# Patient Record
Sex: Male | Born: 1950
Health system: Southern US, Community
[De-identification: ages and names within clinical notes are randomized; demographics above are authoritative.]

## PROBLEM LIST (undated history)

## (undated) DIAGNOSIS — D696 Thrombocytopenia, unspecified: Secondary | ICD-10-CM

## (undated) DIAGNOSIS — E119 Type 2 diabetes mellitus without complications: Secondary | ICD-10-CM

## (undated) DIAGNOSIS — I10 Essential (primary) hypertension: Secondary | ICD-10-CM

## (undated) DIAGNOSIS — H269 Unspecified cataract: Secondary | ICD-10-CM

## (undated) DIAGNOSIS — C19 Malignant neoplasm of rectosigmoid junction: Secondary | ICD-10-CM

## (undated) DIAGNOSIS — E785 Hyperlipidemia, unspecified: Secondary | ICD-10-CM

## (undated) HISTORY — DX: Type 2 diabetes mellitus without complications: E11.9

## (undated) HISTORY — DX: Unspecified cataract: H26.9

## (undated) HISTORY — DX: Thrombocytopenia, unspecified: D69.6

## (undated) HISTORY — DX: Essential (primary) hypertension: I10

## (undated) HISTORY — DX: Malignant neoplasm of rectosigmoid junction: C19

## (undated) HISTORY — DX: Hyperlipidemia, unspecified: E78.5

---

## 2001-12-10 HISTORY — PX: CARDIAC CATHETERIZATION: SHX172

## 2004-11-13 ENCOUNTER — Ambulatory Visit: Payer: Self-pay | Admitting: Internal Medicine

## 2004-12-10 ENCOUNTER — Ambulatory Visit: Payer: Self-pay | Admitting: Internal Medicine

## 2005-03-16 ENCOUNTER — Ambulatory Visit: Payer: Self-pay | Admitting: Internal Medicine

## 2005-03-19 ENCOUNTER — Ambulatory Visit: Payer: Self-pay

## 2005-04-02 ENCOUNTER — Ambulatory Visit: Payer: Self-pay | Admitting: Cardiovascular Disease

## 2005-04-09 ENCOUNTER — Ambulatory Visit: Payer: Self-pay | Admitting: Internal Medicine

## 2007-01-28 ENCOUNTER — Encounter: Payer: Self-pay | Admitting: General Practice

## 2010-11-30 ENCOUNTER — Ambulatory Visit: Payer: Self-pay | Admitting: Family Medicine

## 2010-12-10 ENCOUNTER — Ambulatory Visit: Payer: Self-pay | Admitting: Family Medicine

## 2011-01-10 ENCOUNTER — Ambulatory Visit: Payer: Self-pay | Admitting: Family Medicine

## 2011-02-08 ENCOUNTER — Ambulatory Visit: Payer: Self-pay | Admitting: Family Medicine

## 2014-10-12 ENCOUNTER — Ambulatory Visit: Payer: Self-pay

## 2014-10-12 ENCOUNTER — Ambulatory Visit: Payer: Self-pay | Admitting: Hematology and Oncology

## 2014-10-12 LAB — CBC CANCER CENTER
Bands: 2 %
Basophil: 1 %
Comment - H1-Com1: NORMAL
Comment - H1-Com2: NORMAL
Eosinophil: 5 %
HCT: 42.6 % (ref 40.0–52.0)
HGB: 14.9 g/dL (ref 13.0–18.0)
LYMPHS PCT: 35 %
MCH: 32.9 pg (ref 26.0–34.0)
MCHC: 35 g/dL (ref 32.0–36.0)
MCV: 94 fL (ref 80–100)
MONOS PCT: 2 %
PLATELETS: 50 x10 3/mm — AB (ref 150–440)
RBC: 4.52 10*6/uL (ref 4.40–5.90)
RDW: 12.4 % (ref 11.5–14.5)
SEGMENTED NEUTROPHILS: 55 %
WBC: 6 x10 3/mm (ref 3.8–10.6)

## 2014-10-12 LAB — IRON AND TIBC
IRON BIND. CAP.(TOTAL): 302 ug/dL (ref 250–450)
IRON SATURATION: 34 %
Iron: 103 ug/dL (ref 65–175)
Unbound Iron-Bind.Cap.: 199 ug/dL

## 2014-10-12 LAB — RETICULOCYTES
Absolute Retic Count: 0.0667 10*6/uL (ref 0.019–0.186)
Reticulocyte: 1.48 % (ref 0.4–3.1)

## 2014-10-12 LAB — PROTIME-INR
INR: 1
PROTHROMBIN TIME: 13 s (ref 11.5–14.7)

## 2014-10-12 LAB — FOLATE: Folic Acid: 14 ng/mL (ref 3.1–100.0)

## 2014-10-12 LAB — LACTATE DEHYDROGENASE: LDH: 175 U/L (ref 85–241)

## 2014-10-12 LAB — APTT: Activated PTT: 26.3 secs (ref 23.6–35.9)

## 2014-10-12 LAB — FIBRIN DEGRADATION PROD.(ARMC ONLY): Fibrin Degradation Prod.: <10 ug/ml (ref 2.1–7.7)

## 2014-10-12 LAB — FERRITIN: FERRITIN (ARMC): 57 ng/mL (ref 8–388)

## 2014-10-12 LAB — FIBRINOGEN: Fibrinogen: 219 mg/dL (ref 210–470)

## 2014-10-13 LAB — PROT IMMUNOELECTROPHORES(ARMC)

## 2014-10-15 LAB — URINE IEP, RANDOM

## 2014-11-09 ENCOUNTER — Ambulatory Visit: Payer: Self-pay

## 2014-11-15 LAB — CBC CANCER CENTER
Basophil #: 0 x10 3/mm (ref 0.0–0.1)
Basophil %: 0.9 %
Eosinophil #: 0.2 x10 3/mm (ref 0.0–0.7)
Eosinophil %: 3.9 %
HCT: 43.2 % (ref 40.0–52.0)
HGB: 14.7 g/dL (ref 13.0–18.0)
LYMPHS ABS: 1.9 x10 3/mm (ref 1.0–3.6)
LYMPHS PCT: 42.6 %
MCH: 32.1 pg (ref 26.0–34.0)
MCHC: 34.1 g/dL (ref 32.0–36.0)
MCV: 94 fL (ref 80–100)
Monocyte #: 0.3 x10 3/mm (ref 0.2–1.0)
Monocyte %: 6 %
NEUTROS ABS: 2.1 x10 3/mm (ref 1.4–6.5)
NEUTROS PCT: 46.6 %
Platelet: 53 x10 3/mm — ABNORMAL LOW (ref 150–440)
RBC: 4.59 10*6/uL (ref 4.40–5.90)
RDW: 12.7 % (ref 11.5–14.5)
WBC: 4.5 x10 3/mm (ref 3.8–10.6)

## 2014-12-10 ENCOUNTER — Ambulatory Visit: Payer: Self-pay

## 2015-01-11 ENCOUNTER — Ambulatory Visit: Payer: Self-pay | Admitting: Hematology and Oncology

## 2015-01-11 LAB — CBC CANCER CENTER
BASOS ABS: 0 x10 3/mm (ref 0.0–0.1)
BASOS PCT: 0.9 %
EOS ABS: 0.3 x10 3/mm (ref 0.0–0.7)
Eosinophil %: 6.4 %
HCT: 44.5 % (ref 40.0–52.0)
HGB: 15.2 g/dL (ref 13.0–18.0)
Lymphocyte #: 2.2 x10 3/mm (ref 1.0–3.6)
Lymphocyte %: 45.7 %
MCH: 31.9 pg (ref 26.0–34.0)
MCHC: 34.1 g/dL (ref 32.0–36.0)
MCV: 93 fL (ref 80–100)
Monocyte #: 0.3 x10 3/mm (ref 0.2–1.0)
Monocyte %: 6.9 %
Neutrophil #: 1.9 x10 3/mm (ref 1.4–6.5)
Neutrophil %: 40.1 %
PLATELETS: 60 x10 3/mm — AB (ref 150–440)
RBC: 4.77 10*6/uL (ref 4.40–5.90)
RDW: 12.4 % (ref 11.5–14.5)
WBC: 4.8 x10 3/mm (ref 3.8–10.6)

## 2015-01-11 LAB — CANCER CTR PLATELET CT: Platelet: 58 x10 3/mm — ABNORMAL LOW (ref 150–440)

## 2015-02-08 ENCOUNTER — Ambulatory Visit
Admit: 2015-02-08 | Disposition: A | Discharge status: Home / Self Care | Payer: Self-pay | Attending: Hematology and Oncology | Admitting: Hematology and Oncology

## 2015-03-11 ENCOUNTER — Ambulatory Visit
Admit: 2015-03-11 | Disposition: A | Discharge status: Home / Self Care | Payer: Self-pay | Attending: Hematology and Oncology | Admitting: Hematology and Oncology

## 2015-03-25 LAB — CBC CANCER CENTER
Basophil #: 0 x10 3/mm (ref 0.0–0.1)
Basophil %: 0.9 %
Eosinophil #: 0.1 x10 3/mm (ref 0.0–0.7)
Eosinophil %: 2.8 %
HCT: 45.3 % (ref 40.0–52.0)
HGB: 15.3 g/dL (ref 13.0–18.0)
Lymphocyte #: 2.3 x10 3/mm (ref 1.0–3.6)
Lymphocyte %: 48.1 %
MCH: 31.5 pg (ref 26.0–34.0)
MCHC: 33.7 g/dL (ref 32.0–36.0)
MCV: 93 fL (ref 80–100)
Monocyte #: 0.3 x10 3/mm (ref 0.2–1.0)
Monocyte %: 5.5 %
Neutrophil #: 2 x10 3/mm (ref 1.4–6.5)
Neutrophil %: 42.7 %
Platelet: 65 x10 3/mm — ABNORMAL LOW (ref 150–440)
RBC: 4.85 10*6/uL (ref 4.40–5.90)
RDW: 12.4 % (ref 11.5–14.5)
WBC: 4.8 x10 3/mm (ref 3.8–10.6)

## 2015-04-20 ENCOUNTER — Other Ambulatory Visit: Payer: Self-pay

## 2015-04-20 NOTE — Patient Outreach (Signed)
[facility]) Care Management  04/20/2015  [place] Dec 25, 1950 [id number]   [place] did not show for his scheduled visit. Interpreter had been arranged and showed for the visit.  I will reschedule this visit.   [name], RN, BA, [place], [place] Direct Dial:  [phone]  Fax:  [phone] [email or web address]: [email or web address] [address] [place], [place]  [address]

## 2015-06-15 ENCOUNTER — Other Ambulatory Visit: Payer: Self-pay

## 2015-06-15 NOTE — Patient Instructions (Addendum)
Ictus isqumico (Ischemic Stroke) Un ictus (accidente cerebrovascular) es la muerte sbita del tejido cerebral. Esto es una emergencia mdica. Un ictus puede causar una prdida permanente de la actividad cerebral. Esto puede causar problemas con diferentes partes del cuerpo. Un ataque isqumico transitorio (AIT) es diferente porque no causa un dao permanente. Un AIT es un problema, de corta duracin, de flujo sanguneo deficiente, que afecta a una parte del cerebro. Un AIT tambin es un problema serio porque aumenta considerablemente las probabilidades de sufrir un ictus. Cuando los sntomas se presentan por primera vez, es imposible saber si se trata de un ictus o un AIT. CAUSAS  Un ictus se produce debido a una disminucin del suministro de oxgeno a una zona del cerebro. Generalmente, es el resultado de un pequeo cogulo sanguneo o acumulacin de colesterol o grasa (placa) que bloquea el flujo sanguneo en el cerebro. Un ictus tambin puede producirse por una obstruccin o dao en las arterias cartidas.  FACTORES DE RIESGO Presin arterial elevada (hipertensin). Colesterol elevado. Diabetes mellitus. Enfermedades cardacas. La acumulacin de placa en los vasos sanguneos (enfermedad arterial perifrica o ateroesclerosis). La acumulacin de placa en los vasos sanguneos que proveen sangre y oxgeno al cerebro (estenosis de la arteria cartida). Ritmo cardaco anormal (fibrilacin auricular). La obesidad. Fumar. Consumir anticonceptivos orales, especialmente en combinacin con el hbito de fumar. Falta de actividad fsica. Dieta rica en grasas, sal (sodio) y muchas caloras. Consumo de alcohol. Consumo de drogas ilegales (especialmente cocana y metanfetamina). Ser de Mining engineer. Tener ms de 55aos. Antecedentes familiares de ictus. Historia previa de cogulos sanguneos, ictus, ataque isqumico transitorio o infarto. Anemia drepanoctica. SNTOMAS  Estos sntomas por lo  general aparecen en forma repentina, o podran aparecer al despertar: Debilidad o adormecimiento sbito de la cara, el brazo o la pierna, especialmente en un lado del cuerpo. Dificultad repentina para caminar o para mover los brazos o las piernas. Confusin sbita. Cambios de personalidad sbitos. Dificultad para hablar (afasia) o comprender. Dificultad para tragar. Dificultad sbita en la visin de uno o ambos ojos. Visin doble. Mareos. Prdida del equilibrio o de la coordinacin. Dolor de [place] repentino sin causa aparente. Dificultad para leer o escribir. DIAGNSTICO  El mdico podr determinar la presencia o ausencia de un ictus en funcin de los sntomas, antecedentes y del examen fsico. [place], se realiza una tomografa computarizada (TC) del cerebro para confirmar el ictus, determinar las causas y la gravedad. Se pueden realizar otros estudios para hallar la causa del ictus. Estos estudios pueden incluir: Aeronautical engineer. Monitorizacin electrocardiogrfica continua: Ecocardiograma. Ecografa de la cartida. Imgenes por Health visitor (IRM). Estudio de la circulacin cerebral. Anlisis de [place]. [place] sufrir un ictus puede disminuir al tratar, de manera apropiada, la hipertensin arterial, el colesterol alto, la diabetes, la enfermedad cardaca y la obesidad, y al dejar de fumar, limitar el consumo de alcohol y Buyer, retail fsicamente [place]. [place] es fundamental. Es importante que busque tratamiento ante Software engineer signo de estos sntomas porque podr recibir un medicamento para Surveyor, mining (tromboltico), que no puede administrarse si ha pasado demasiado tiempo desde el comienzo de los sntomas. Aunque no sepa cundo comenzaron los sntomas, obtenga tratamiento lo antes posible, dado que hay otras opciones de tratamiento disponibles, que incluyen oxgeno, lquidos intravenosos (IV) y anticoagulantes. El tratamiento del  ictus depende de la duracin, la gravedad y la causa de los sntomas. Pueden usarse medicamentos e incluirse cambios en la dieta para tratar la diabetes, la hipertensin arterial  y [place]. Los fisioterapeutas y terapeutas del habla y ocupacionales lo evaluarn y trabajarn con usted para mejorar las funciones deterioradas por el ictus. Se tomarn medidas para prevenir complicaciones a corto y Barrister's clerk, que incluyen infecciones por Naval architect extrao en los pulmones (neumona por aspiracin), cogulos sanguneos en las piernas, escaras y cadas. Pocas veces se requiere Ardelia Mems ciruga para extraer grandes cogulos de sangre o abrir arterias bloqueadas. [place] los medicamentos solamente como se lo haya indicado el mdico. Siga cuidadosamente las indicaciones. Podrn administrarle medicamentos para controlar los factores de riesgo del ictus. Asegrese de comprender todas las indicaciones en relacin a la administracin de los medicamentos. Tal vez le indiquen que tome un medicamento para Building surveyor la [place], como la aspirina o el anticoagulante warfarina. Es necesario que reciba la warfarina exactamente segn las indicaciones. Las dosis altas y bajas de warfarina son peligrosas. El exceso de warfarina aumenta el riesgo de [place]. Dosis demasiado bajas de warfarina aumentan el riesgo de formacin de cogulos. Durante el tratamiento con warfarina, es necesario que se realicen peridicamente anlisis de sangre que miden el tiempo de Proofreader. Los anlisis de sangre incluirn el TP y el INR. Los resultados del [place] y del INR permiten al mdico ajustar la dosis de warfarina. La dosis puede cambiarse por varias razones. Es de suma importancia que tome la warfarina exactamente como se le prescribi, y que tenga los niveles de [place] y de INR exactamente como se le indic. Muchos de los alimentos, especialmente los que contienen vitamina K pueden interferir con  la warfarina y Print production planner los resultados del [place] y del INR. Los alimentos con alto contenido de vitamina K incluyen espinaca, col rizada, brcoli, repollo, acelga y nabos verdes, repollitos de [place], guisantes, coliflor, algas, perejil, hgado de cerdo y de [place], t verde y Scientist, research (life sciences) de soja. Debe consumir siempre una buena cantidad de alimentos ricos en vitamina K. Evite los Continental Airlines en su dieta, o informe a su mdico antes de cambiar su dieta. Solicite una cita con un nutricionista para hacerle las preguntas que le surjan. Muchos medicamentos interfieren con la warfarina y [employer] del [place] y del INR. Debe informarle a su mdico acerca de todos los medicamentos que tome. Esto incluye todas las vitaminas y suplementos. Tenga especial cuidado con la aspirina y los medicamentos antiinflamatorios. No tome ni suspenda ningn medicamento tanto recetado como de [employer], excepto si se lo indica el mdico o el farmacutico. La warfarina puede tener [place], tales como hematomas o sangrado en exceso. Si se lastima, deber ejercer presin sobre las heridas por ms tiempo que lo habitual. Su mdico o su Development worker, international aid comentarn otros posibles efectos secundarios. Evite practicar deportes que puedan causar lesiones o sangrado. Sea cuidadoso al [employer], Risk manager hilo dental, o manipular objetos cortantes. El alcohol puede modificar la capacidad del organismo de metabolizar la warfarina. Es Museum/gallery conservator las bebidas alcohlicas o consumir solo cantidades muy pequeas cuando se est en tratamiento con warfarina. Hgale saber a su mdico si modifica su consumo de alcohol. Informe a su dentista o a otros profesionales antes de que le efecten cualquier procedimiento. Si los estudios han determinado la presencia de su reflejo de deglucin, debera ingerir alimentos saludables. Una dieta que incluya 5o ms porciones de frutas y vegetales por da puede reducir el riesgo de ictus. Los  alimentos deben tener una consistencia especial (blandos o hechos pur) o los bocados deben ser  pequeos para no aspirarse o ahogarse. Se pueden indicar determinados cambios alimenticios para controlar la hipertensin arterial, los niveles elevados de colesterol, la diabetes o la obesidad. Para controlar la hipertensin arterial, se recomiendan alimentos con bajo contenido de sodio, grasas saturadas, grasas (trans) y colesterol. Los niveles de colesterol se pueden Chief Technology Officer con alimentos que tienen alto contenido de [place] y bajo contenido de grasas saturadas, grasas trans y Research officer, trade union. Para controlar la diabetes, se recomienda controlar el consumo de carbohidratos y Location manager. Para controlar la obesidad, se recomienda reducir el consumo de caloras y elegir alimentos con bajo contenido de sodio, grasas saturadas, grasas trans y colesterol. Mantenga un peso saludable. Mantngase fsicamente activo. Se recomienda que realice al menos [id number] de [place] todos o casi [employer]. No consuma ningn producto que contenga tabaco, como cigarrillos, tabaco de Higher education careers adviser o Psychologist, sport and exercise. Limite el consumo de alcohol aunque no tome warfarina. Se considera consumo moderado: No ms de 2 medidas por da para los hombres. No ms de 1 medida por da para las mujeres que no estn [place]. La seguridad en el hogar. Un ambiente seguro en el hogar es importante para reducir el riesgo de cadas. El mdico podr disponer que los especialistas evalen su hogar. Tambin es Data processing manager barras para sostn en la habitacin y el bao. El Buyer, retail un equipo para Occupational psychologist, como un inodoro elevado y un asiento para la ducha. Terapia fsica, ocupacional y del habla. Tal vez sea necesario realizar un tratamiento continuo para optimizar la recuperacin despus de un ictus. Si le aconsejaron el uso de un bastn o andador, selo en todo momento. Concurra a todas las sesiones teraputicas. Siga  todas las indicaciones en cuanto a los controles con el mdico. Esto es muy importante. Aqu se incluyen interconsultas, fisioterapia, rehabilitacin y anlisis de laboratorio. El control apropiado puede evitar que se produzca otro ictus. SOLICITE ATENCIN MDICA SI: Hay cambios en su personalidad. Tiene dificultad para tragar. Tiene visin doble Tiene mareos. Tiene fiebre. Tiene lceras en la piel. SOLICITE ATENCIN MDICA DE INMEDIATO SI:  Cualquiera de estos sntomas puede representar un problema grave y es Engineer, maintenance (IT). No espere a que los sntomas desaparezcan. Solicite atencin mdica de inmediato. Comunquese con el servicio de emergencias de su localidad (911 en los [place]). No conduzca por sus propios medios Principal Financial. Siente debilidad o adormecimiento sbito de la cara, el brazo o la pierna, especialmente en un lado del cuerpo. Tiene una dificultad repentina para caminar o para mover los brazos o las piernas. Se siente repentinamente confundido. Tiene dificultad para hablar (afasia) o comprender. Presenta, sbitamente, dificultad para ver de uno o ambos ojos. Pierde el equilibrio o la coordinacin. Siente sbitamente un dolor de cabeza intenso que no tiene causa aparente. Siente un nuevo dolor en el pecho o latidos cardacos irregulares. Sufre la prdida parcial o total de la conciencia. Document Released: 09/05/2005 Document Revised: 04/12/2014 [facility] Patient Information 2015 [place]. This information is not intended to replace advice given to you by your health care provider. Make sure you discuss any questions you have with your health care provider. Infarto de miocardio (Myocardial Infarction) Un infarto de miocardio (IM) es una lesin irreversible en el corazn. Tambin se denomina ataque al corazn. El infarto de miocardio por lo general sucede cuando una arteria del corazn (coronaria) se obstruye o bloquea. Esto produce una disminucin del flujo  sanguneo al corazn. Cuando una o ms arterias del corazn (coronarias) se obstruyen, esa  zona del corazn comienza a morir. Esto produce el dolor que se siente durante el infarto de miocardio.  Si cree que podra estar sufriendo un infarto de miocardio, comunquese inmediatamente con el servicio de emergencias de su localidad (911 en [place] Se recomienda que mastique y trague 3aspirinas infantiles sin [place] entrica sino tiene [place] a la aspirina. No conduzca por sus propios medios al hospital ni espere para ver si los sntomas desaparecen. Cuanto antes reciba tratamiento, mayor ser la cantidad de msculo cardaco que se salve. El tiempo salva al [employer]. Esto puede salvar su vida. CAUSAS  Un infarto de miocardio puede ocurrir por:  Una acumulacin gradual de sustancia grasa llamada placa. Cuando la placa se acumula en las arterias, causa una enfermedad llamada aterosclerosis. Esta acumulacin puede bloquear o reducir el flujo sanguneo hacia las arterias del corazn.  Una rotura repentina de la placa interna de la arteria del corazn que cause un cogulo sanguneo (trombo). Un cogulo sanguneo puede obstruir una arteria del corazn y no permitir el flujo sanguneo hacia el corazn.  Un gran estrechamiento (espasmo) de la arteria del corazn. Esta es una causa menos frecuente de infarto. Cuando una arteria cardaca sufre un espasmo, se detiene el flujo sanguneo por la arteria. Los espasmos pueden ocurrir en arterias del corazn que no tengan aterosclerosis. [place] que tienen riesgo de sufrir infarto de miocardio generalmente tienen uno o ms factores de riesgo como:  Hipertensin arterial.  Colesterol elevado.  Fumar.  El sexo. Los hombres tienen ms riesgo de sufrir un ataque al Intel Corporation.  Sobrepeso/obesidad.  La edad.  Antecedentes familiares.  La falta de actividad fsica.  Diabetes.  Estrs.  Abuso en el consumo de bebidas  alcohlicas.  Consumo de drogas (cocana y metanfetaminas). SNTOMAS  Los sntomas pueden variar, por ejemplo:  Los sntomas de infarto de miocardio incluyen lo siguiente, tanto en hombres como en mujeres:  Dolor en el pecho. Este dolor se siente como una aplastamiento, torsin u opresin Architectural technologist. Puede "desplazarse" es decir que puede estar originado en una parte del cuerpo y sentirse en otra. Puede sentirse en el brazo izquierdo, en el cuello o la [place]. Tambin puede sentirse en el brazo derecho.  Falta de aire (disnea).  Acidez o indigestin con o sin vmitos, falta de aire o sudoraciones (diaforesis).  Sudor fro repentino.  Mareos repentinos.  Dolor en la espalda.  Las mujeres pueden tener un nico sntoma como:  Sensacin inexplicable de nerviosismo o ansiedad.  Molestias entre los omplatos (escpula) o en la parte superior de la espalda.  Hormigueos en las manos y los brazos.  En los ancianos (sin importar el sexo), los sntomas pueden ser tan sutiles como:  Sudoraciones (diaforesis).  Falta de aire (disnea).  Cansancio general (fatiga) o no sentirse bien Web designer). DIAGNSTICO  El diagnstico de infarto de miocardio incluye diferentes estudios como:  Evaluacin de los signos vitales como el ritmo cardaco, presin arterial, frecuencia respiratoria y [place] de oxgeno.  Un ECG para observar la actividad elctrica del corazn.  Ciertos anlisis de sangre llamados marcadores cardacos se realizan segn Location manager para medir las protenas o las enzimas liberadas por el msculo cardaco daado.  Radiografa de trax.  Un ecocardiograma para evaluar el movimiento del corazn y el flujo sanguneo.  Angiografa coronaria (cateterismo cardaco). Este es un procedimiento diagnstico para observar las arterias del corazn. TRATAMIENTO  Intervencin [place]. En el caso de infarto de miocardio, el estndar Manpower Inc  Unidos es realizar una  intervencin aguda en menos de 90 minutos luego de ingresar al hospital. Ardelia Mems intervencin aguda es un procedimiento especial para abrir las arterias del corazn. Se hace en una sala de tratamiento llamada "sala de cateterismo". Algunos hospitales no cuentan con esta sala. Si est sufriendo un infarto de miocardio y el hospital no cuenta con una sala de cateterismo, la medida estndar es trasladarlo a un hospital que lo tenga. En esta sala de cateterismo, la intervencin aguda comprende:  Angioplastia. La angioplastia incluye introducir un tubo delgado y flexible (catter) en una arteria de la ingle o la [employer]. Se gua el catter [employer] International arterias del corazn. Se infla el baln que se encuentra en el extremo del catter para abrir la arteria obstruida o estrecha. Durante la angioplastia, puede utilizarse un pequeo tubo de [place] (stent) para Theatre manager la arteria del Film/video editor. Segn sea su afeccin y sus antecedentes mdicos, podrn colocarle uno de [employer]seco tipos de stent:  Stents liberadores de frmacos (SLF). Este tipo de stent est cubierto con un medicamento que evita la formacin de cicatrices [place]. En este caso, ser necesario que reciba medicamentos anticoagulantes durante hasta un ao.  Stents metlicos. Este tipo de stent no tiene un recubrimiento especial para evitar que los tejidos se desarrollen sobre l. Se utiliza en caso de que el paciente no pueda recibir medicamentos anticoagulantes por un perodo prolongado o que necesite una ciruga en un futuro cercano. Una vez colocado el stent metlico, deber recibir medicamentos anticoagulantes durante alrededor de Financial risk analyst.  Si est tomando un medicamento anticoagulante (tratamiento antiplaquetario) despus de la colocacin del stent, no deje de tomarlo si no recibe indicaciones del mdico. Asegrese de comprender durante cunto tiempo debe tomar el medicamento. Intervencin [place].  Si no se obtiene xito con el  tratamiento agudo, ser Intel se someta a [place].  Ciruga a corazn abierto (injerto de revascularizacin arterial coronaria, CABG). En el injerto de revascularizacin arterial coronaria, se toma una vena (venasafena) de la pierna. Luego la vena se une a la arteria cardaca obstruida con lo cual se desva el flujo sanguneo evitando la zona obstruida. Esto permite que se reanude el flujo sanguneo hacia el msculo cardaco. Intervenciones adicionales  Pueden administrarle un "destructor de cogulos" (tromboltico). Este medicamento ayuda a romper el cogulo que se encuentra en la arteria coronaria. Este medicamento puede administrarse si la persona no puede llegar a una sala de cateterismo de forma inmediata.  Baln de contrapulsacin intra-artico (BCPA). Si ha sufrido un infarto de miocardio grave y est inestable para que lo lleven a la sala de cateterismo o le realicen una ciruga, podrn colocarle un baln de contrapulsacin intra-artico. Este es un dispositivo mecnico temporal que se utiliza para aumentar el flujo sanguneo hacia el corazn y reducir la carga de trabajo del corazn hasta que se encuentre lo suficientemente estable como para que lo traten en la sala de cateterismo o le realicen Clementeen Hoof. INSTRUCCIONES PARA EL CUIDADO EN EL HOGAR Despus de sufrir un infarto de miocardio usted podr necesitar:  Medicamentos. Tome los Tenneco Inc se lo haya indicado el mdico. Los medicamentos indicados para despus de un infarto de miocardio pueden:  Product/process development scientist que la sangre se coagule fcilmente (medicamentos que alteran la coagulacin).  Controlar su presin arterial.  Disminuir el nivel de colesterol.  Controlar del ritmo cardaco anormal.  Cambios en el estilo de vida. Con la supervisin del mdico, los cambios en el estilo de vida  incluyen:  Si fuma, dejar de hacerlo. El mdico puede ayudarlo a dejar de fumar.  Estar fsicamente activo.  Mantener un peso  saludable.  Consumir una dieta saludable para el corazn. Un nutricionista podr ayudarlo a hacer elecciones saludables.  Controlar la diabetes.  Reducir las situaciones de estrs.  Limitar el consumo de bebidas alcohlicas. SOLICITE ATENCIN [place] DE INMEDIATO SI:   Siente un dolor intenso en el pecho, especialmente si lo siente como una opresin o un peso y se extiende [employer], la [place], el cuello o la [place]. Esto es Engineer, maintenance (IT). No espere a ver si el dolor se pasa. Obtenga ayuda mdica de inmediato. Comunquese con el servicio de emergencias de su localidad (911 en los [place]). No conduzca por sus propios medios [employer] Sachs hospital.  Siente falta de aire estando en reposo, durmiendo o al Psychologist, occupational.  Comienza a transpirar repentinamente o tiene la piel pegajosa.  Tiene Higher education careers adviser (nuseas) y vomita.  Se siente repentinamente mareado o se desmaya.  Siente que el corazn late rpidamente o nota que "saltea" latidos. ASEGRESE DE QUE:   Comprende estas instrucciones.  Controlar su afeccin.  Recibir ayuda de inmediato si no mejora o si empeora. Document Released: 09/05/2005 Document Revised: 12/01/2013 [facility] Patient Information 2015 [place]. This information is not intended to replace advice given to you by your health care provider. Make sure you discuss any questions you have with your health care provider. Anlisis de hemoglobina A1c  (Hemoglobin A1c Test) El anlisis de la hemoglobina (Hb)A1c mide la cantidad promedio de azcar (glucosa) en la sangre durante los 2 a 3 meses previos al estudio (en lugar de medir la cantidad actual de glucosa en la Antionette Poles). Una parte de la glucosa que circula en el torrente sanguneo se une a las protenas de Herbalist. La Hb es una protena de la sangre que transporta el oxgeno en los glbulos rojos [place]). Cuando la glucosa se une a la Hb, la Hb cubierta con glucosa se llama Hb  glicosilada. Una vez que la Hb es [place], permanece de ese modo durante todo el ciclo de vida de los glbulos rojos (alrededor de 120 das).  El anlisis de HbA1c se [place] para diagnosticar diabetes mellitus y Chief Technology Officer a Barrister's clerk los niveles de Museum/gallery exhibitions officer en los enfermos de diabetes mellitus. El anlisis de la HbA1c puede utilizarse en lugar del anlisis de nivel de glucosa en sangre en ayunas o la prueba de tolerancia a la glucosa oral, o en combinacin con las mismas.  Hay varios mtodos para medir la concentracin de HbA1c en la sangre. Un mtodo utiliza anticuerpos que se unen especficamente a la HbA1c. Un instrumento de laboratorio luego mide la concentracin en [place] de anticuerpos unidos. Un segundo mtodo llamado cromatografa lquida de alta presin de intercambio inico separa la HbA1c de otros tipos de Hb sobre la base de diferencias en la carga Art therapist. Un tercer mtodo para medir HbA1c utiliza enzimas que reaccionan especficamente con la glucosa en HbA1c para producir un cambio de color que se puede medir.  RESULTADOS  Es su responsabilidad retirar el resultado del [place]. Consulte en el laboratorio cuando y cmo podr [employer]. Comunquese con su mdico por cualquier duda que tenga [employer]Life.  Rango de The Mutual [employer] Omaha rangos para los valores normales pueden variar entre diferentes laboratorios y hospitales. Siempre debe consultar a su mdico despus de Administrator, arts de laboratorio u otros estudios que  se realicen para conocer el significado de los resultados de las pruebas y si los valores estn dentro de los lmites normales. El rango de los resultados normales para HbA1c vara entre 4,5% hasta menos de 5,7%.  Significado de los [employer] Corporation estn fuera de los rangos de los valores normales Los valores anormalmente altos de HbA1c son con ms frecuencia una indicacin de prediabetes mellitus y diabetes mellitus:   Un resultado en  HbA1c de 5,7-6,4% se considera diagnstico de prediabetes mellitus.  Un resultado en HbA1c de 6,5% o ms en dos [employer] Products and Chemicals se considera diagnstico de diabetes mellitus. Hay ciertas situaciones que pueden causar un resultado bajo falso en el anlisis de HbA1c. Estas situaciones incluyen el embarazo, prdidas de Arp, transfusiones de [place], anemia causada por la destruccin prematura de los glbulos rojos (anemia hemoltica), y Arboriculturist formas poco frecuentes de Hb (Variantes de Hb), como el rasgo drepanoctico.  Hay ciertas situaciones que pueden causar un resultado bajo falso en el anlisis de HbA1c. Estas situaciones incluyen el dficit de hierro, la insuficiencia renal y ciertas variantes de Hb.  Document Released: 03/23/2013 Document Revised: 04/12/2014 [place] [facility] Patient Information 2015 [place]. This information is not intended to replace advice given to you by your health care provider. Make sure you discuss any questions you have with your health care provider.

## 2015-06-15 NOTE — Patient Outreach (Signed)
[facility]) Care Management  [place]  06/15/2015   Nathan Atkinson [birth year] [id number]  Subjective: Patient states he feels well, checking blood sugars regularly, exercising a minimum of 4x/week outside of work.   Objective:   Current Medications:  Current Outpatient Prescriptions  Medication Sig Dispense Refill  . glipiZIDE (GLUCOTROL) 5 MG tablet Take 5 mg by mouth daily before breakfast.    . metFORMIN (GLUCOPHAGE) 500 MG tablet Take 500 mg by mouth daily with breakfast.     No current facility-administered medications for this visit.   Atorvastatin 40 mg once a day Enalapril 10mg  once a day  Functional Status:  In your present state of health, do you have any difficulty performing the following activities: 06/15/2015  Hearing? N  Vision? N  Difficulty concentrating or making decisions? N  Walking or climbing stairs? N  Dressing or bathing? N  Doing errands, shopping? N    Fall/Depression Screening: PHQ 2/9 Scores 06/15/2015  PHQ - 2 Score 0    Assessment: Managing his diabetes effectively.  Blood sugars 70-169mg /dl fasting and 106-151mg /dl 2 hour post prandial. He has no complaints  Plan: Encouraged to exercise regularly minimally 4x/week, check sugars at least 1x/day.  Attend scheduled eye and dental exams.  See MD in August- obtain annual labs including an A1C.  [facility] CM Care Plan Problem One        Patient Outreach from 06/15/2015 in [place] Problem One  Patient does not know the symptoms of heart attack and stroke   Care Plan for Problem One  Active   [facility] Long Term Goal (31-90 days)  Patient will be able to verbalize the symptoms of a heart attack and stroke and discuss the treatment steps by our next visit in october, 2016   [place] Term Goal Start Date  06/15/15   Interventions for Problem One Long Term Goal  Read and share information I gave him with his family regarding stroke and heart attack       [name], RN, [place], [place], [place]:  [phone]  Fax:  [phone] E-mail: [name] [address] [place], [place]  [address]

## 2015-06-20 ENCOUNTER — Other Ambulatory Visit: Payer: Self-pay

## 2015-06-20 DIAGNOSIS — D696 Thrombocytopenia, unspecified: Secondary | ICD-10-CM

## 2015-06-24 ENCOUNTER — Inpatient Hospital Stay: Payer: 59 | Admitting: Hematology and Oncology

## 2015-06-24 ENCOUNTER — Inpatient Hospital Stay: Payer: 59 | Attending: Hematology and Oncology

## 2015-08-05 ENCOUNTER — Other Ambulatory Visit: Payer: Self-pay

## 2015-08-05 DIAGNOSIS — D696 Thrombocytopenia, unspecified: Secondary | ICD-10-CM

## 2015-08-08 ENCOUNTER — Inpatient Hospital Stay: Payer: 59 | Admitting: Hematology and Oncology

## 2015-08-08 ENCOUNTER — Inpatient Hospital Stay: Payer: 59 | Attending: Hematology and Oncology

## 2015-08-11 DIAGNOSIS — H269 Unspecified cataract: Secondary | ICD-10-CM

## 2015-08-11 HISTORY — DX: Unspecified cataract: H26.9

## 2015-08-19 ENCOUNTER — Inpatient Hospital Stay: Payer: 59 | Admitting: Hematology and Oncology

## 2015-08-19 ENCOUNTER — Inpatient Hospital Stay: Payer: 59

## 2015-09-21 ENCOUNTER — Other Ambulatory Visit: Payer: Self-pay

## 2015-09-21 VITALS — BP 130/60 | Ht 63.0 in | Wt 151.2 lb

## 2015-09-21 DIAGNOSIS — E119 Type 2 diabetes mellitus without complications: Secondary | ICD-10-CM | POA: Insufficient documentation

## 2015-09-21 LAB — POCT GLYCOSYLATED HEMOGLOBIN (HGB A1C): HEMOGLOBIN A1C: 7.3

## 2015-09-21 NOTE — Patient Instructions (Signed)
Anlisis de hemoglobina A1c (Hemoglobin A1c Test) Parte del azcar (glucosa) que circula por la sangre se adhiere a las protenas de la [place]. La hemoglobina (Hb o Hgb) es un tipo de protena de la sangre a la que se adhiere la glucosa. Tambin transporta oxgeno en los glbulos rojos (GR). Cuando la glucosa se adhiere a la Hb, la Hb cubierta de glucosa se denomina glucohemoglobina. Una vez que la glucosa se adhiere a la Hb, permanece de esa manera durante la vida del glbulo rojo. Esto es alrededor de 120 das. En vez de medir el nivel sanguneo de glucosa en un solo da, el anlisis de hemoglobina A1c (HbA1c) mide la cantidad promedio de glucohemoglobina y, por lo tanto, la cantidad promedio de glucosa en la sangre durante 3 a 4 meses antes de que se realice el anlisis. El anlisis de HbA1c se [place] para hacer un control a largo plazo del Dispensing optician en personas con diabetes mellitus. El anlisis de HbA1c tambin se puede usar como complemento del anlisis del nivel sanguneo de glucosa en ayunas o de la prueba oral de tolerancia a la glucosa, o en combinacin con ellos. RESULTADOS Es su responsabilidad retirar el resultado del [place]. Consulte en el laboratorio o en el departamento en el que fue realizado el estudio cundo y cmo podr [employer]. Comunquese con el mdico si tiene [employer]. Rango de The Mutual [employer] Omaha rangos para los valores normales pueden variar entre diferentes laboratorios y hospitales. Siempre debe consultar a su mdico despus de Dispensing optician un anlisis u otros estudios para Armed forces logistics/support/administrative officer significado de los resultados de las pruebas y si los valores estn dentro de los lmites normales. Los valores normales del anlisis de HbA1c son los siguientes:  Adultos o nios sin diabetes: del 4% al 5,9%.  Adultos o nios con diabetes y buen control del nivel sanguneo de glucosa: menos del 6,5%.- 7% Varios factores pueden afectar los resultados  del anlisis de HbA1c. Estas pueden incluir las siguientes:  Enfermedades (hemoglobinopatas) que provocan un cambio en la forma, el tamao o la cantidad de Hb en la [place].  Una duracin de los glbulos rojos mayor de lo normal.  Niveles anormalmente bajos de ciertas protenas en la sangre.  Comer alimentos o tomar suplementos con alto contenido de vitamina C (cido ascrbico). Significado de los [employer] Corporation estn fuera de los rangos de los valores normales Los valores anormalmente altos de HbA1c suelen ser un indicador de prediabetes mellitus y diabetes mellitus.  Un resultado de HbA1c del 5,7% al 6,4% se considera un diagnstico de prediabetes mellitus.  Un resultado de HbA1c del 6,5% o superior en dos ocasiones distintas se considera un diagnstico de diabetes mellitus. Los valores anormalmente bajos de HbA1c pueden deberse a varias enfermedades. Estas pueden incluir las siguientes:  Media planner.  [place] cantidad de prdida [place].  Transfusiones de [place].  Valores bajos en el recuento de glbulos rojos (anemia). Esto se debe a la destruccin prematura de glbulos rojos.  Insuficiencia renal de larga duracin.  Algunas formas poco frecuentes de Hb (variantes de la Hb), como el rasgo drepanoctico. Hable con el mdico [employer]. El mdico [employer] para Optometrist un diagnstico y Teacher, adult education un plan de tratamiento adecuado para usted.   Esta informacin no tiene Marine scientist el consejo del mdico. Asegrese de hacerle al mdico cualquier pregunta que tenga.   Document Released: 03/23/2013 Document Revised: 12/17/2014 Elsevier Interactive Patient Education Nation[employer].

## 2015-09-21 NOTE — Patient Outreach (Signed)
[facility]) Care Management  [place]  09/21/2015   Nathan Atkinson 05-11-1951 [id number]  Subjective: Nathan Atkinson in for his regularly scheduled Link to Wellness visit.  He comes with his meter and blood sugars written down.  He admits to running out of his Glipizide last Friday, October 7th, 2016- fasting blood sugars 101-146mg /dl, up from August when fasting blood sugars were 78-133mg /dl.  He complains of right foot arch pain, worse when he stands in the morning.  He describes the pain today as 6/10 and has been using ice that the MD recommended when he saw her in August.  Reports that MD increased Metformin from 1-500mg  tablet to 2000mg  Metformin per day.  He has been taking it regularly without any problems. He reports that the MD told him his A1C was elevated but he is not sure what it was.   Objective:  Filed Vitals:   09/21/15 0919  BP: 130/60  Height: 1.6 m (5\' 3" )  Weight: 151 lb 3.2 oz (68.584 kg)     Current Medications:  Current Outpatient Prescriptions  Medication Sig Dispense Refill  . atorvastatin (LIPITOR) 40 MG tablet Take 40 mg by mouth daily.    . enalapril (VASOTEC) 10 MG tablet Take 10 mg by mouth daily.    Marland Kitchen glipiZIDE (GLUCOTROL) 5 MG tablet Take 5 mg by mouth daily before breakfast.    . metFORMIN (GLUCOPHAGE) 500 MG tablet Take 1,000 mg by mouth 2 (two) times daily.      No current facility-administered medications for this visit.    Functional Status:  In your present state of health, do you have any difficulty performing the following activities: 09/21/2015 06/15/2015  Hearing? N N  Vision? N N  Difficulty concentrating or making decisions? N N  Walking or climbing stairs? N N  Dressing or bathing? N N  Doing errands, shopping? N N    Fall/Depression Screening: PHQ 2/9 Scores 09/21/2015 06/15/2015  PHQ - 2 Score 0 0   POCT A1C 7.3%.  Reviewed the symptoms of a heart attack and stroke again and have reviewed the  importance of A1C.   Assessment: Exercising and taking meds as ordered.  Attempting to eat foods as instructed.  Checks blood sugars as instructed.   Plan: I have asked Nathan Atkinson to schedule a follow up appointment with MD by the end of the year (aim for November) to reassess his blood sugar control and evaluate the new medication regime.   I will call him in December and schedule a visit with him in January, 2017.   [name], RN, BA, [place], [place] Direct Dial:  [phone]  Fax:  [phone] E-mail: [email or web address] [address] [place], [place]  [address]

## 2015-12-28 ENCOUNTER — Other Ambulatory Visit: Payer: Self-pay

## 2015-12-28 VITALS — Ht 63.0 in | Wt 152.0 lb

## 2015-12-28 DIAGNOSIS — E119 Type 2 diabetes mellitus without complications: Secondary | ICD-10-CM

## 2015-12-28 NOTE — Patient Outreach (Signed)
[facility]) Care Management  [place]  01/02/2016   Nathan Atkinson May 29, 1951 [id number]  Subjective: Patient did not show for his scheduled visit so I spoke with him with an interpreter via phone. He reports that he is exercising less than usual because his wife recently had surgery and he has been helping her at home. Patient continues to check her blood sugar on a regular basis  Objective:  Filed Vitals:   12/28/15 0920  Height: 1.6 m (5' 3")  Weight: 152 lb (68.947 kg)   * this is a stated weight  Current Medications:  Current Outpatient Prescriptions  Medication Sig Dispense Refill  . enalapril (VASOTEC) 10 MG tablet Take 10 mg by mouth daily.    Marland Kitchen glipiZIDE (GLUCOTROL) 5 MG tablet Take 5 mg by mouth daily after supper.     Marland Kitchen atorvastatin (LIPITOR) 40 MG tablet Take 40 mg by mouth daily. Reported on 12/28/2015    . metFORMIN (GLUCOPHAGE) 500 MG tablet Take 1,000 mg by mouth 2 (two) times daily. Reported on 12/28/2015- taking 543m bid Metformin- blood sugars were getting too low     No current facility-administered medications for this visit.    Functional Status:  In your present state of health, do you have any difficulty performing the following activities: 12/28/2015 09/21/2015  Hearing? N N  Vision? N N  Difficulty concentrating or making decisions? N N  Walking or climbing stairs? N N  Dressing or bathing? N N  Doing errands, shopping? N N    Fall/Depression Screening: PHQ 2/9 Scores 12/28/2015 09/21/2015 06/15/2015  PHQ - 2 Score 0 0 0    Assessment: Nathan Atkinson continues to be engaged in his diabetes care, exercising 4x/week and checking his blood sugars every morning.  The blood sugar is 109-1329mdl.  He ran out of Lipitor and did not understand it was an ongoing medication and has not been taking it for quite some time.   Plan: Patient to resume the use of his Lipitor. Continue check blood sugars most days of the week .  Aim for  exercise 4x/week for 30 minutes.  Continue Metformin 50065mid- was ordered 1000m68m/day but states  "it was getting too low".  Take Glipizide 5mg 67m.    THN CM Care Plan Problem One        Most Recent Value   Care Plan Problem One  Patient does not know the symptoms of heart attack and stroke   Role Documenting the Problem One  Care Management CoordMcKittrickProblem One  Active   THN Long Term Goal (31-90 days)  Patient will be able to verbalize the symptoms of a heart attack and stroke and discuss the treatment steps by our next visit in october, 2016   THN [place] Goal Start Date  06/15/15   [facility] Term Goal Met Date  09/21/15   Interventions for Problem One Long Term Goal  Read and share information I gave him with his family regarding stroke and heart attack    [facility] Problem Two        Most Recent Value   Care Plan Problem Two  Elevated A1C - no MD visit scheduled after MD made a change in therapy   Role Documenting the Problem Two  Care Management CoordBrentwoodProblem Two  Active   Interventions for Problem Two Long Term Goal   call to schedule.  THN Long Term Goal (31-90) days  Patient will schedule and attend a follow up appointment with his MD by june 2017.    THN Long Term Goal Start Date  12/28/15       Follow up with me in 3 months.     [name], RN, BA, [place], [place] Direct Dial:  [phone]  Fax:  [phone] E-mail: [email or web address] [address], [place], [place]  [address]

## 2016-02-01 ENCOUNTER — Other Ambulatory Visit: Payer: Self-pay

## 2016-02-01 VITALS — BP 140/80 | Ht 63.0 in | Wt 149.8 lb

## 2016-02-01 DIAGNOSIS — E119 Type 2 diabetes mellitus without complications: Secondary | ICD-10-CM

## 2016-02-01 LAB — POCT GLYCOSYLATED HEMOGLOBIN (HGB A1C): Hemoglobin A1C: 7.1

## 2016-02-01 NOTE — Patient Outreach (Signed)
[facility]) Care Management  [place]  02/01/2016   Nathan Atkinson [birth year] [id number]  Subjective: Met with patient with an interpreter for the Link to Wellness diabetes program. He is checking blood sugars most days of the week; his fasting blood sugars are 82-173 mg/dl and the 2 hour after meal blood sugars are 95-180 mg/dl.  He is walking most days of the week- 40-60 minutes.  He has no complaints of chest pain or shortness of breath.  He does complain of some heal pain in his right foot, a couple days a week, usually when he wears his work shoes. He has had no hypo or hyperglycemia. He is taking his medications as ordered- he only takes 556m of metformin bid.   Objective:  Filed Vitals:   02/01/16 1040  BP: 140/80  Height: 1.6 m ('5\' 3"' )  Weight: 149 lb 12.8 oz (67.949 kg)   POCT A1C 7.1%  Current Medications:  Current Outpatient Prescriptions  Medication Sig Dispense Refill  . atorvastatin (LIPITOR) 40 MG tablet Take 40 mg by mouth daily. Reported on 12/28/2015    . enalapril (VASOTEC) 10 MG tablet Take 10 mg by mouth daily.    .Marland KitchenglipiZIDE (GLUCOTROL) 5 MG tablet Take 5 mg by mouth daily after supper.     . metFORMIN (GLUCOPHAGE) 500 MG tablet Take 1,000 mg by mouth 2 (two) times daily. Reported on 12/28/2015- taking 5060mbid Metformin- blood sugars were getting too low     No current facility-administered medications for this visit.    Functional Status:  In your present state of health, do you have any difficulty performing the following activities: 02/01/2016 12/28/2015  Hearing? N N  Vision? N N  Difficulty concentrating or making decisions? N N  Walking or climbing stairs? N N  Dressing or bathing? N N  Doing errands, shopping? N N    Fall/Depression Screening: PHQ 2/9 Scores 02/01/2016 12/28/2015 09/21/2015 06/15/2015  PHQ - 2 Score 0 0 0 0    Assessment: Nathan Atkinson is up to date on his dilated eye and dental exams. We discussed  the need to continue to eat his 3 meals and continue exercise and checking his blood sugars- he checks in the evening if he feels he has over eaten.  Plan: I have asked the patient to schedule his next doctor visit before June, 2017.  He may benefit from doing some hamstring stretches every day after exercising.    THN CM Care Plan Problem One        Most Recent Value   Care Plan Problem One  Patient does not know the symptoms of heart attack and stroke [completed on 12/28/15]   Role Documenting the Problem One  Care Management CoManzano Springsor Problem One  Not Active    [facility] Plan Problem Two        Most Recent Value   Care Plan Problem Two  Elevated A1C - no MD visit scheduled after MD made a change in therapy   Role Documenting the Problem Two  Care Management CoKingsford Heightsor Problem Two  Active   Interventions for Problem Two Long Term Goal   schedule a doctor's appointment by june 2017   [facility] Goal (31-90) days  Schedule an annual MD visit   [facility] Term Goal Start Date  12/28/15      JuGentry FitzRN, BA, MHAnaholaCDPinoleetwork Diabetes Coordinator-  Link To Wellness Direct Dial:  [phone]  Fax:  [phone] E-mail: [email or web address] [address] [place],   [address]

## 2016-05-09 ENCOUNTER — Other Ambulatory Visit: Payer: Self-pay

## 2016-05-09 VITALS — BP 130/68 | Ht 63.0 in | Wt 150.5 lb

## 2016-05-09 DIAGNOSIS — E1165 Type 2 diabetes mellitus with hyperglycemia: Secondary | ICD-10-CM

## 2016-05-09 DIAGNOSIS — IMO0002 Reserved for concepts with insufficient information to code with codable children: Secondary | ICD-10-CM | POA: Insufficient documentation

## 2016-05-09 LAB — POCT GLYCOSYLATED HEMOGLOBIN (HGB A1C): Hemoglobin A1C: 7.6

## 2016-05-09 NOTE — Patient Outreach (Signed)
[facility]) Care Management  [place]  05/09/2016   Nathan Atkinson 27-Jul-1951 [id number]  Subjective: Patient in with his blood sugars; fasting 105-160 mg/dl but higher daily numbers on average than 3 months ago. 2 hour post prandial blood sugars 99-132m/dl. Reports he's still exercising most days of the week for 45-60 minutes but reports eating 2 evening meals; one at work than 1 at home.  He also reports drinking a shake (made at home with vegetables and fruit) a few times a week. He reports that he is still not taking the Metformin 100102mbid (he's only taking 50036mid) because he reports that the 1000m38md was causing him to have low blood sugars. He stopped taking his cholesterol medications near the end of March because he did not go back and pick more up- he questioned me if he needed it.    Objective:  Filed Vitals:   05/09/16 1049  BP: 130/68  Height: 1.6 m (_0 )  Weight: 150 lb 8 oz (68.266 kg)   POCT A1C 7.6%  Encounter Medications:  Outpatient Encounter Prescriptions as of 05/09/2016  Medication Sig Note  . enalapril (VASOTEC) 10 MG tablet Take 10 mg by mouth daily.   . glMarland KitchenpiZIDE (GLUCOTROL) 5 MG tablet Take 5 mg by mouth daily after supper.    . metFORMIN (GLUCOPHAGE) 500 MG tablet Take 1,000 mg by mouth 2 (two) times daily. Reported on 12/28/2015- taking 500mg68m Metformin- blood sugars were getting too low   . atorvastatin (LIPITOR) 40 MG tablet Take 40 mg by mouth daily. Reported on 05/09/2016 05/09/2016: Last filled on 12/29/15  (90 tablets)     No facility-administered encounter medications on file as of 05/09/2016.    Functional Status:  In your present state of health, do you have any difficulty performing the following activities: 05/09/2016 02/01/2016  Hearing? N N  Vision? N N  Difficulty concentrating or making decisions? N N  Walking or climbing stairs? N N  Dressing or bathing? N N  Doing errands, shopping? N N     Fall/Depression Screening: PHQ 2/9 Scores 05/09/2016 02/01/2016 12/28/2015 09/21/2015 06/15/2015  PHQ - 2 Score 0 0 0 0 0    Assessment: Patient is engaged- I re-educated him on the need to take his medication as ordered and the need to limit his intake to balance blood sugar control.   Plan:  [facility]N [facility] Plan Problem Two        Most Recent Value   Care Plan Problem Two  Elevated A1C - no MD visit scheduled after MD made a change in therapy   Role Documenting the Problem Two  Care Management CoordMerrillProblem Two  Active   Interventions for Problem Two Long Term Goal   schedule a doctor's appointment by August 2017  2. Take 1000mg 25mromin bid as ordered 3. Continue to walk most days of the week  4. Continue to check sugars 1-2 times per day   THN Long Term Goal (31-90) days  lower A1C to less than 7.6% when assesses in 3-4 months   THN Long Term Goal Start Date  05/09/16 [goal Barrie Folket]     Follow up scheduled with me in early September.    Arelly Whittenberg Gentry FitzBA, MHA, CHunterdonTrZendat Dial:  [phone]  [phone]: [email or web address] [phone]

## 2016-06-06 DIAGNOSIS — R109 Unspecified abdominal pain: Secondary | ICD-10-CM | POA: Diagnosis not present

## 2016-06-06 DIAGNOSIS — I10 Essential (primary) hypertension: Secondary | ICD-10-CM | POA: Diagnosis not present

## 2016-06-06 DIAGNOSIS — E119 Type 2 diabetes mellitus without complications: Secondary | ICD-10-CM | POA: Diagnosis not present

## 2016-06-06 DIAGNOSIS — Z1389 Encounter for screening for other disorder: Secondary | ICD-10-CM | POA: Diagnosis not present

## 2016-06-06 DIAGNOSIS — R7309 Other abnormal glucose: Secondary | ICD-10-CM | POA: Diagnosis not present

## 2016-07-30 DIAGNOSIS — Z1389 Encounter for screening for other disorder: Secondary | ICD-10-CM | POA: Diagnosis not present

## 2016-07-30 DIAGNOSIS — R197 Diarrhea, unspecified: Secondary | ICD-10-CM | POA: Diagnosis not present

## 2016-07-30 DIAGNOSIS — Z1211 Encounter for screening for malignant neoplasm of colon: Secondary | ICD-10-CM | POA: Diagnosis not present

## 2016-08-02 DIAGNOSIS — R197 Diarrhea, unspecified: Secondary | ICD-10-CM | POA: Diagnosis not present

## 2016-08-02 DIAGNOSIS — Z1211 Encounter for screening for malignant neoplasm of colon: Secondary | ICD-10-CM | POA: Diagnosis not present

## 2016-08-22 ENCOUNTER — Other Ambulatory Visit: Payer: Self-pay

## 2016-08-22 VITALS — BP 134/72 | HR 79 | Ht 63.0 in | Wt 147.0 lb

## 2016-08-22 DIAGNOSIS — E1165 Type 2 diabetes mellitus with hyperglycemia: Secondary | ICD-10-CM

## 2016-08-22 LAB — POCT GLYCOSYLATED HEMOGLOBIN (HGB A1C): HEMOGLOBIN A1C: 8

## 2016-08-22 NOTE — Patient Outreach (Signed)
[place]) Care Management  [place]  08/22/2016   Nathan Atkinson Oct 13, 1951 [id number]  Subjective: Met with Nathan Atkinson and an interpreter for the three month follow up visit in the Link to Wellness program.  His A1C was 7.6% when we did a point of care test in May 2017.  The patient was not taking his medications as they were ordered; he wasn't taking his Lipitor because he thought he didn't need it and he was only taking Metformin 588m bid, not the 10029mMetformin bid as it was ordered.   Since then he has been taking all his medications as ordered but started having loose stool, cramping, and the urge to have a bowel movement every time he ate.  He met with his physician who has set him up to have a colonoscopy in mid-October. This will be his first colonoscopy.   Objective: Repeat A1C- now 8%. His fasting blood sugars are 104-15666ml and his evening blood sugars are 138-241m72m.  Vitals:   08/22/16 1113  BP: 134/72  Pulse: 79  SpO2: 96%  Weight: 147 lb (66.7 kg)  Height: 1.6 m (_0 )     Encounter Medications:  Outpatient Encounter Prescriptions as of 08/22/2016  Medication Sig Note  . atorvastatin (LIPITOR) 40 MG tablet Take 40 mg by mouth daily. Reported on 05/09/2016 05/09/2016: Last filled on 12/29/15  (90 tablets)    . enalapril (VASOTEC) 10 MG tablet Take 10 mg by mouth daily.   . glMarland KitchenpiZIDE (GLUCOTROL) 5 MG tablet Take 5 mg by mouth daily after supper.    . metFORMIN (GLUCOPHAGE) 500 MG tablet Take 1,000 mg by mouth 2 (two) times daily. Reported on 08/22/17/2017- taking 2-500mg34m Metformin- causing extreme number of bowel movements and abd. pain    No facility-administered encounter medications on file as of 08/22/2016.     Functional Status:  In your present state of health, do you have any difficulty performing the following activities: 05/09/2016 02/01/2016  Hearing? N N  Vision? N N  Difficulty concentrating or making decisions?  N N  Walking or climbing stairs? N N  Dressing or bathing? N N  Doing errands, shopping? N N  Some recent data might be hidden    Fall/Depression Screening: PHQ 2/9 Scores 05/09/2016 02/01/2016 12/28/2015 09/21/2015 06/15/2015  PHQ - 2 Score 0 0 0 0 0    Assessment: We spent a great deal of time clarifying with Nathan Atkinson exactly what medications he was taking, the ADA guidelines for A1C and blood sugar control, the importance of a colonoscopy and the importance of managing his dietary intake to manage blood sugars.   Plan: I have asked Nathan Atkinson to follow up with his MD because his A1C is higher and he is not tolerating the Metformin at this higher dose. Strongly encouraged he continue to go for his colonoscopy.  I will follow up with him in 2 weeks.    JulieGentry Fitz BA, MHA, [place] TFederal Heightsct Dial:  [phone]:  [phone]: [email or web address] [address]liSnover [phone]

## 2016-09-05 ENCOUNTER — Other Ambulatory Visit: Payer: Self-pay

## 2016-09-05 NOTE — Patient Outreach (Signed)
[facility]) Care Management  09/05/2016  Nathan Atkinson 1951-02-28 [id number]   02/01/16- only taking Metformin 500mg  bid  05/09/16- only taking Metformin 500mg  bid (ordered 1000mg  bid) because he says the               1000mg  bid is causing blood sugars to go too low- A1C elevated- encouraged                to take Metformin as ordered 1000mg  bid  08/22/16- started taking Metformin as ordered 1000mg  bid but went to MD with                complaint of loose watery stool, urgency, bloody stool. Patient did not tell MD                about Metformin dosing over the past few months when he visited with her.                      When he saw me this day, patient was asked to                follow up with MD to discuss hx of Metformin.   09/05/18- Patient restarted Metformin at a higher dose of 1000mg  in am and 500mg  in               the pm after encouragement from a co-worker who stated- "maybe you didn't               take it long enough".  Today, patient tells me he is having less diarrhea.  He               did not schedule follow up with MD as requested. Encouraged him to     increase Metformin to 1000mg  bid as ordered by MD and     to schedule follow up with MD within 2 weeks.     [name], RN, BA, [place], [place] Direct Dial:  [phone]  Fax:  [phone] E-mail: [email or web address] [address], [place], [place]  [address]

## 2016-09-10 DIAGNOSIS — E119 Type 2 diabetes mellitus without complications: Secondary | ICD-10-CM | POA: Diagnosis not present

## 2016-09-10 DIAGNOSIS — R197 Diarrhea, unspecified: Secondary | ICD-10-CM | POA: Diagnosis not present

## 2016-09-10 DIAGNOSIS — I10 Essential (primary) hypertension: Secondary | ICD-10-CM | POA: Diagnosis not present

## 2016-09-10 DIAGNOSIS — Z23 Encounter for immunization: Secondary | ICD-10-CM | POA: Diagnosis not present

## 2016-09-10 DIAGNOSIS — R195 Other fecal abnormalities: Secondary | ICD-10-CM | POA: Diagnosis not present

## 2016-09-10 DIAGNOSIS — R7309 Other abnormal glucose: Secondary | ICD-10-CM | POA: Diagnosis not present

## 2016-09-12 DIAGNOSIS — R197 Diarrhea, unspecified: Secondary | ICD-10-CM | POA: Diagnosis not present

## 2016-09-26 ENCOUNTER — Ambulatory Visit
Admission: RE | Admit: 2016-09-26 | Discharge: 2016-09-26 | Disposition: A | Discharge status: Home / Self Care | Payer: 59 | Source: Ambulatory Visit | Attending: Gastroenterology | Admitting: Gastroenterology

## 2016-09-26 ENCOUNTER — Other Ambulatory Visit: Payer: Self-pay | Admitting: Gastroenterology

## 2016-09-26 DIAGNOSIS — K625 Hemorrhage of anus and rectum: Secondary | ICD-10-CM | POA: Diagnosis not present

## 2016-09-26 DIAGNOSIS — R918 Other nonspecific abnormal finding of lung field: Secondary | ICD-10-CM | POA: Diagnosis not present

## 2016-09-26 DIAGNOSIS — N281 Cyst of kidney, acquired: Secondary | ICD-10-CM | POA: Diagnosis not present

## 2016-09-26 DIAGNOSIS — R590 Localized enlarged lymph nodes: Secondary | ICD-10-CM | POA: Diagnosis not present

## 2016-09-26 DIAGNOSIS — K629 Disease of anus and rectum, unspecified: Secondary | ICD-10-CM | POA: Diagnosis not present

## 2016-09-26 DIAGNOSIS — R1013 Epigastric pain: Secondary | ICD-10-CM

## 2016-09-26 DIAGNOSIS — K769 Liver disease, unspecified: Secondary | ICD-10-CM | POA: Diagnosis not present

## 2016-09-26 DIAGNOSIS — K6289 Other specified diseases of anus and rectum: Secondary | ICD-10-CM

## 2016-09-26 DIAGNOSIS — K7689 Other specified diseases of liver: Secondary | ICD-10-CM | POA: Diagnosis not present

## 2016-09-26 LAB — POCT I-STAT CREATININE: CREATININE: 0.7 mg/dL (ref 0.61–1.24)

## 2016-09-26 MED ORDER — IOPAMIDOL (ISOVUE-300) INJECTION 61%
100.0000 mL | Freq: Once | INTRAVENOUS | Status: AC | PRN
  Administered 2016-09-26: 100 mL via INTRAVENOUS

## 2016-09-28 DIAGNOSIS — R194 Change in bowel habit: Secondary | ICD-10-CM | POA: Diagnosis not present

## 2016-09-28 DIAGNOSIS — R933 Abnormal findings on diagnostic imaging of other parts of digestive tract: Secondary | ICD-10-CM | POA: Diagnosis not present

## 2016-09-28 DIAGNOSIS — K625 Hemorrhage of anus and rectum: Secondary | ICD-10-CM | POA: Diagnosis not present

## 2016-09-28 DIAGNOSIS — R1013 Epigastric pain: Secondary | ICD-10-CM | POA: Diagnosis not present

## 2016-10-03 ENCOUNTER — Encounter: Payer: Self-pay | Admitting: *Deleted

## 2016-10-04 ENCOUNTER — Ambulatory Visit
Admission: RE | Admit: 2016-10-04 | Discharge: 2016-10-04 | Disposition: A | Discharge status: Home / Self Care | Payer: 59 | Source: Ambulatory Visit | Attending: Gastroenterology | Admitting: Gastroenterology

## 2016-10-04 ENCOUNTER — Encounter
Admission: RE | Disposition: A | Discharge status: Home / Self Care | Payer: Self-pay | Source: Ambulatory Visit | Attending: Gastroenterology

## 2016-10-04 ENCOUNTER — Encounter: Payer: Self-pay | Admitting: *Deleted

## 2016-10-04 ENCOUNTER — Ambulatory Visit: Payer: 59 | Admitting: Anesthesiology

## 2016-10-04 ENCOUNTER — Telehealth: Payer: Self-pay

## 2016-10-04 DIAGNOSIS — R1013 Epigastric pain: Secondary | ICD-10-CM | POA: Insufficient documentation

## 2016-10-04 DIAGNOSIS — K769 Liver disease, unspecified: Secondary | ICD-10-CM | POA: Insufficient documentation

## 2016-10-04 DIAGNOSIS — R591 Generalized enlarged lymph nodes: Secondary | ICD-10-CM | POA: Diagnosis not present

## 2016-10-04 DIAGNOSIS — K2950 Unspecified chronic gastritis without bleeding: Secondary | ICD-10-CM | POA: Diagnosis not present

## 2016-10-04 DIAGNOSIS — K2960 Other gastritis without bleeding: Secondary | ICD-10-CM | POA: Diagnosis not present

## 2016-10-04 DIAGNOSIS — K208 Other esophagitis: Secondary | ICD-10-CM | POA: Diagnosis not present

## 2016-10-04 DIAGNOSIS — R748 Abnormal levels of other serum enzymes: Secondary | ICD-10-CM | POA: Diagnosis not present

## 2016-10-04 DIAGNOSIS — K210 Gastro-esophageal reflux disease with esophagitis: Secondary | ICD-10-CM | POA: Insufficient documentation

## 2016-10-04 DIAGNOSIS — Z7984 Long term (current) use of oral hypoglycemic drugs: Secondary | ICD-10-CM | POA: Diagnosis not present

## 2016-10-04 DIAGNOSIS — C19 Malignant neoplasm of rectosigmoid junction: Secondary | ICD-10-CM | POA: Insufficient documentation

## 2016-10-04 DIAGNOSIS — Z79899 Other long term (current) drug therapy: Secondary | ICD-10-CM | POA: Diagnosis not present

## 2016-10-04 DIAGNOSIS — K6289 Other specified diseases of anus and rectum: Secondary | ICD-10-CM | POA: Diagnosis not present

## 2016-10-04 DIAGNOSIS — I10 Essential (primary) hypertension: Secondary | ICD-10-CM | POA: Diagnosis not present

## 2016-10-04 DIAGNOSIS — C20 Malignant neoplasm of rectum: Secondary | ICD-10-CM | POA: Diagnosis not present

## 2016-10-04 DIAGNOSIS — E785 Hyperlipidemia, unspecified: Secondary | ICD-10-CM | POA: Insufficient documentation

## 2016-10-04 DIAGNOSIS — K2970 Gastritis, unspecified, without bleeding: Secondary | ICD-10-CM | POA: Diagnosis not present

## 2016-10-04 DIAGNOSIS — Z87891 Personal history of nicotine dependence: Secondary | ICD-10-CM | POA: Diagnosis not present

## 2016-10-04 DIAGNOSIS — R918 Other nonspecific abnormal finding of lung field: Secondary | ICD-10-CM | POA: Insufficient documentation

## 2016-10-04 DIAGNOSIS — E119 Type 2 diabetes mellitus without complications: Secondary | ICD-10-CM | POA: Insufficient documentation

## 2016-10-04 DIAGNOSIS — K225 Diverticulum of esophagus, acquired: Secondary | ICD-10-CM | POA: Diagnosis not present

## 2016-10-04 DIAGNOSIS — Z538 Procedure and treatment not carried out for other reasons: Secondary | ICD-10-CM | POA: Diagnosis not present

## 2016-10-04 DIAGNOSIS — K629 Disease of anus and rectum, unspecified: Secondary | ICD-10-CM | POA: Diagnosis present

## 2016-10-04 HISTORY — PX: COLONOSCOPY: SHX5424

## 2016-10-04 HISTORY — PX: ESOPHAGOGASTRODUODENOSCOPY (EGD) WITH PROPOFOL: SHX5813

## 2016-10-04 LAB — GLUCOSE, CAPILLARY: Glucose-Capillary: 101 mg/dL — ABNORMAL HIGH (ref 65–99)

## 2016-10-04 SURGERY — COLONOSCOPY
Anesthesia: General

## 2016-10-04 MED ORDER — PROPOFOL 500 MG/50ML IV EMUL
INTRAVENOUS | Status: DC | PRN
  Administered 2016-10-04: 150 ug/kg/min via INTRAVENOUS

## 2016-10-04 MED ORDER — SODIUM CHLORIDE 0.9 % IV SOLN: INTRAVENOUS | Status: DC

## 2016-10-04 MED ORDER — LIDOCAINE HCL (CARDIAC) 10 MG/ML IV SOLN
INTRAVENOUS | Status: DC | PRN
  Administered 2016-10-04 (×3): 10 mg via INTRAVENOUS

## 2016-10-04 MED ORDER — GLYCOPYRROLATE 0.2 MG/ML IJ SOLN
INTRAMUSCULAR | Status: DC | PRN
Start: 2016-10-04 — End: 2016-10-04
  Administered 2016-10-04: 0.1 mg via INTRAVENOUS

## 2016-10-04 MED ORDER — SODIUM CHLORIDE 0.9 % IV SOLN
INTRAVENOUS | Status: DC
  Administered 2016-10-04: 10:00:00 via INTRAVENOUS

## 2016-10-04 MED ORDER — PROPOFOL 10 MG/ML IV BOLUS
INTRAVENOUS | Status: DC | PRN
  Administered 2016-10-04: 25 mg via INTRAVENOUS
  Administered 2016-10-04 (×2): 50 mg via INTRAVENOUS
  Administered 2016-10-04: 25 mg via INTRAVENOUS

## 2016-10-04 NOTE — Telephone Encounter (Signed)
  Oncology Nurse Navigator Documentation Received request for appt for metastatic colon cancer. Appt arranged with Dr. Mike Gip for 10/30 at 1345. [place] to notify of appt using their interpreter.  Navigator Location: [facility]-Med Onc (10/04/16 1300) Referral date to RadOnc/MedOnc: 10/04/16 (10/04/16 1300) )Navigator Encounter Type: Telephone (10/04/16 1300) Telephone: Appt Confirmation/Clarification (10/04/16 1300)                                                  Time Spent with Patient: 30 (10/04/16 1300)

## 2016-10-04 NOTE — Op Note (Signed)
[facility] Gastroenterology Patient Name: Nathan Atkinson Procedure Date: 10/04/2016 10:23 AM MRN: [id number] Account #: [id number] Date of Birth: 04/06/1951 Admit Type: Outpatient Age: 65 Room: [facility] ENDO ROOM 1 Gender: Male Note Status: Finalized Procedure:            Colonoscopy Indications:          Abnormal CT of the GI tract, Rectal mass Providers:            Lollie Sails, MD Referring MD:         Denton Lank MD, MD (Referring MD) Medicines:            Monitored Anesthesia Care Complications:        No immediate complications. Procedure:            Pre-Anesthesia Assessment:                       - ASA Grade Assessment: III - A patient with severe                        systemic disease.                       After obtaining informed consent, the colonoscope was                        passed under direct vision. Throughout the procedure,                        the patient's blood pressure, pulse, and oxygen                        saturations were monitored continuously. The                        Colonoscope was introduced through the anus with the                        intention of advancing to the cecum. The scope was                        advanced to the rectum before the procedure was                        aborted. Medications were given. The colonoscopy was                        performed without difficulty. The patient tolerated the                        procedure well. The quality of the bowel preparation                        was good. Findings:      A fungating and infiltrative partially obstructing large mass was found       in the recto-sigmoid colon. The mass was circumferential. The mass       measured nine cm in length. The lesion is friable to even rinsing. The       scope was advanced to about 18 cm from the anal verge where a stenosis  is seen in the mass. I was unable to go further proximally. Biopsies   were taken with a cold forceps for histology. 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 Biopsied. Recommendation:       - Full liquid diet today, then advance as tolerated to                        low residue diet.                       - Await pathology results.                       - Refer to a colo-rectal surgeon at appointment to be                        scheduled. Procedure Code(s):    --- Professional ---                       [phone], 67, Colonoscopy, flexible; with biopsy, single                        or multiple Diagnosis Code(s):    --- Professional ---                       C19, Malignant neoplasm of rectosigmoid junction                       K62.89, Other specified diseases of anus and rectum                       R93.3, Abnormal findings on diagnostic imaging of other                        parts of digestive tract CPT copyright 2016 [employer]. All rights reserved. The codes documented in this report are preliminary and upon coder review may  be revised to meet current compliance requirements. Lollie Sails, MD 10/04/2016 11:11:55 AM This report has been signed electronically. Number of Addenda: 0 Note Initiated On: 10/04/2016 10:23 AM Total Procedure Duration: 0 hours 11 minutes 31 seconds       [facility]

## 2016-10-04 NOTE — Anesthesia Postprocedure Evaluation (Signed)
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  Procedure(s) Performed: Procedure(s) (LRB): COLONOSCOPY (N/A) ESOPHAGOGASTRODUODENOSCOPY (EGD) WITH PROPOFOL (N/A)  Patient location during evaluation: Endoscopy Anesthesia Type: General Level of consciousness: awake and alert Pain management: pain level controlled Vital Signs Assessment: post-procedure vital signs reviewed and stable Respiratory status: spontaneous breathing, nonlabored ventilation, respiratory function stable and patient connected to nasal cannula oxygen Cardiovascular status: blood pressure returned to baseline and stable Postop Assessment: no signs of nausea or vomiting Anesthetic complications: no    Last Vitals:  Vitals:   10/04/16 1148 10/04/16 1158  BP: 130/64 (!) 144/64  Pulse: (!) 54 (!) 53  Resp: 13 14  Temp:      Last Pain:  Vitals:   10/04/16 1108  TempSrc: Tympanic                 Nathan Atkinson

## 2016-10-04 NOTE — OR Nursing (Signed)
Unable to pass scope past 18 cm stenotic stricture at area of mass. Biopsies taken

## 2016-10-04 NOTE — Transfer of Care (Signed)
Immediate Anesthesia Transfer of Care Note  Patient: Nathan Atkinson  Procedure(s) Performed: Procedure(s): COLONOSCOPY (N/A) ESOPHAGOGASTRODUODENOSCOPY (EGD) WITH PROPOFOL (N/A)  Patient Location: PACU  Anesthesia Type:General  Level of Consciousness: unresponsive  Airway & Oxygen Therapy: Patient Spontanous Breathing and Patient connected to nasal cannula oxygen  Post-op Assessment: Report given to RN and Post -op Vital signs reviewed and stable  Post vital signs: Reviewed and stable  Last Vitals:  Vitals:   10/04/16 0956 10/04/16 1108  BP: 137/72 (!) 99/59  Pulse: 67 60  Resp: 15 16  Temp: 36.5 C 36.3 C    Last Pain:  Vitals:   10/04/16 1108  TempSrc: Tympanic         Complications: No apparent anesthesia complications

## 2016-10-04 NOTE — H&P (Signed)
Outpatient short stay form Pre-procedure 10/04/2016 10:22 AM Lollie Sails MD  Primary Physician: Dr. Denton Lank  Reason for visit:  EGD and colonoscopy  History of present illness:  Patient is a 65 year old male presenting today as above. He has been having some problems with rectal bleeding as well as some occasional epigastric pain. He was found have some abnormal liver associated enzymes. Most recent platelet count on 09/26/2016 was 113 with a prothrombin INR 1.1. A CT scan of the abdomen and pelvis was done on 09/26/2016 this showing innumerable ill-defined liver lesions compatible with metastatic disease. There were some pulmonary nodules in the right lower lobe. There is a thickened area in the rectum noted. There are some lymphadenopathy in the upper abdomen periaortic windows.  He tolerated his prep well. He takes no aspirin or blood thinning agents.    Current Facility-Administered Medications:  .  0.9 %  sodium chloride infusion, , Intravenous, Continuous, Lollie Sails, MD, Last Rate: 20 mL/hr at 10/04/16 1021 .  0.9 %  sodium chloride infusion, , Intravenous, Continuous, Lollie Sails, MD  Prescriptions Prior to Admission  Medication Sig Dispense Refill Last Dose  . atorvastatin (LIPITOR) 40 MG tablet Take 40 mg by mouth daily. Reported on 05/09/2016   10/02/2016  . enalapril (VASOTEC) 10 MG tablet Take 10 mg by mouth daily.   10/02/2016  . glipiZIDE (GLUCOTROL) 5 MG tablet Take 5 mg by mouth daily after supper.    10/02/2016  . metFORMIN (GLUCOPHAGE) 500 MG tablet Take 1,000 mg by mouth 2 (two) times daily. Reported on 08/22/17/2017- taking 2-500mg  bid Metformin- causing extreme number of bowel movements and abd. Pain Reported on 09/05/16- now taking 2 tabs q am and 1 tab in the pm- diarrhea has decreased.   10/02/2016     No Known Allergies   Past Medical History:  Diagnosis Date  . Cataract 08/2015   forming  . Diabetes mellitus without complication ([place])    . Hyperlipidemia   . Hypertension   . Platelets decreased ([place])     Review of systems:      Physical Exam    Heart and lungs: Regular rate and rhythm without rub or gallop, lungs are bilaterally clear.    HEENT: Normocephalic atraumatic eyes are anicteric    Other:     Pertinant exam for procedure: Soft nontender nondistended bowel sounds positive normoactive.    Planned proceedures: EGD, colonoscopy and indicated procedures. I have discussed the risks benefits and complications of procedures to include not limited to bleeding, infection, perforation and the risk of sedation and the patient wishes to proceed.    Lollie Sails, MD Gastroenterology 10/04/2016  10:22 AM

## 2016-10-04 NOTE — Op Note (Signed)
[facility] Gastroenterology Patient Name: Nathan Atkinson Procedure Date: 10/04/2016 10:20 AM MRN: [id number] Account #: [id number] Date of Birth: [birth year] Admit Type: Outpatient Age: 65 Room: [facility] ENDO ROOM 1 Gender: Male Note Status: Finalized Procedure:            Upper GI endoscopy Indications:          Epigastric abdominal pain Providers:            Lollie Sails, MD Referring MD:         Denton Lank MD, MD (Referring MD) Medicines:            Monitored Anesthesia Care Complications:        No immediate complications. Procedure:            Pre-Anesthesia Assessment:                       - ASA Grade Assessment: III - A patient with severe                        systemic disease.                       After obtaining informed consent, the endoscope was                        passed under direct vision. Throughout the procedure,                        the patient's blood pressure, pulse, and oxygen                        saturations were monitored continuously. The Endoscope                        was introduced through the mouth, and advanced to the                        third part of duodenum. The upper GI endoscopy was                        accomplished without difficulty. The patient tolerated                        the procedure well. Findings:      LA Grade A (one or more mucosal breaks less than 5 mm, not extending       between tops of 2 mucosal folds) esophagitis with no bleeding was found.       Biopsies were taken with a cold forceps for histology.      The exam of the esophagus was otherwise normal.      Patchy minimal inflammation characterized by congestion (edema) and       erythema was found in the gastric body. Biopsies were taken with a cold       forceps for [Purpose]. Biopsies were taken with a cold forceps for       histology.      The cardia and gastric fundus were normal on retroflexion.      The examined duodenum  was normal. Impression:           - LA Grade A erosive esophagitis. Biopsied.                       -  Gastritis. Biopsied.                       - Normal examined duodenum. Recommendation:       - Use Prilosec (omeprazole) 20 mg PO daily daily. Procedure Code(s):    --- Professional ---                       [phone], Esophagogastroduodenoscopy, flexible, transoral;                        with biopsy, single or multiple Diagnosis Code(s):    --- Professional ---                       K20.8, Other esophagitis                       K29.70, Gastritis, unspecified, without bleeding                       R10.13, Epigastric pain CPT copyright 2016 American Medical Association. All rights reserved. The codes documented in this report are preliminary and upon coder review may  be revised to meet current compliance requirements. Lollie Sails, MD 10/04/2016 10:48:20 AM This report has been signed electronically. Number of Addenda: 0 Note Initiated On: 10/04/2016 10:20 AM      [facility]

## 2016-10-04 NOTE — Anesthesia Preprocedure Evaluation (Signed)
Anesthesia Evaluation  Patient identified by MRN, date of birth, ID band Patient awake    Reviewed: Allergy & Precautions, H&P , NPO status , Patient's Chart, lab work & pertinent test results  Airway Mallampati: III  TM Distance: <3 FB Neck ROM: limited    Dental  (+) Poor Dentition, Chipped, Loose Patient reports that top right medical incisor is loose :   Pulmonary neg shortness of breath, former smoker,    Pulmonary exam normal breath sounds clear to auscultation       Cardiovascular Exercise Tolerance: Good hypertension, (-) angina(-) Past MI and (-) DOE Normal cardiovascular exam Rhythm:regular Rate:Normal     Neuro/Psych negative neurological ROS  negative psych ROS   GI/Hepatic negative GI ROS, Neg liver ROS,   Endo/Other  diabetes, Type 2  Renal/GU negative Renal ROS  negative genitourinary   Musculoskeletal   Abdominal   Peds  Hematology negative hematology ROS (+)   Anesthesia Other Findings Patient is NPO appropriate and reports no nausea or vomiting today.  Past Medical History: 08/2015: Cataract     Comment: forming No date: Diabetes mellitus without complication ([facility]) No date: Hyperlipidemia No date: Hypertension No date: Platelets decreased [facility])  Past Surgical History: 2003: CARDIAC CATHETERIZATION  BMI    Body Mass Index:  26.04 kg/m      Reproductive/Obstetrics negative OB ROS                             Anesthesia Physical Anesthesia Plan  ASA: III  Anesthesia Plan: General   Post-op Pain Management:    Induction:   Airway Management Planned:   Additional Equipment:   Intra-op Plan:   Post-operative Plan:   Informed Consent: I have reviewed the patients History and Physical, chart, labs and discussed the procedure including the risks, benefits and alternatives for the proposed anesthesia with the patient or authorized representative who has  indicated his/her understanding and acceptance.   Dental Advisory Given  Plan Discussed with: Anesthesiologist, CRNA and Surgeon  Anesthesia Plan Comments: (History and consent via interpreter )        Anesthesia Quick Evaluation

## 2016-10-05 LAB — SURGICAL PATHOLOGY

## 2016-10-07 DIAGNOSIS — K6389 Other specified diseases of intestine: Secondary | ICD-10-CM | POA: Diagnosis not present

## 2016-10-07 DIAGNOSIS — D649 Anemia, unspecified: Secondary | ICD-10-CM | POA: Diagnosis not present

## 2016-10-07 DIAGNOSIS — E669 Obesity, unspecified: Secondary | ICD-10-CM | POA: Diagnosis not present

## 2016-10-07 DIAGNOSIS — C20 Malignant neoplasm of rectum: Secondary | ICD-10-CM | POA: Diagnosis not present

## 2016-10-07 DIAGNOSIS — R195 Other fecal abnormalities: Secondary | ICD-10-CM | POA: Diagnosis not present

## 2016-10-07 DIAGNOSIS — I10 Essential (primary) hypertension: Secondary | ICD-10-CM | POA: Diagnosis not present

## 2016-10-07 DIAGNOSIS — E119 Type 2 diabetes mellitus without complications: Secondary | ICD-10-CM | POA: Diagnosis not present

## 2016-10-07 DIAGNOSIS — K921 Melena: Secondary | ICD-10-CM | POA: Diagnosis not present

## 2016-10-07 DIAGNOSIS — K639 Disease of intestine, unspecified: Secondary | ICD-10-CM | POA: Diagnosis not present

## 2016-10-08 ENCOUNTER — Inpatient Hospital Stay: Payer: 59 | Attending: Hematology and Oncology | Admitting: Hematology and Oncology

## 2016-10-08 ENCOUNTER — Encounter: Payer: Self-pay | Admitting: Gastroenterology

## 2016-10-08 ENCOUNTER — Inpatient Hospital Stay: Payer: 59

## 2016-10-08 DIAGNOSIS — I10 Essential (primary) hypertension: Secondary | ICD-10-CM | POA: Insufficient documentation

## 2016-10-08 DIAGNOSIS — Z7984 Long term (current) use of oral hypoglycemic drugs: Secondary | ICD-10-CM | POA: Diagnosis not present

## 2016-10-08 DIAGNOSIS — Z87891 Personal history of nicotine dependence: Secondary | ICD-10-CM | POA: Diagnosis not present

## 2016-10-08 DIAGNOSIS — K210 Gastro-esophageal reflux disease with esophagitis: Secondary | ICD-10-CM | POA: Insufficient documentation

## 2016-10-08 DIAGNOSIS — C19 Malignant neoplasm of rectosigmoid junction: Secondary | ICD-10-CM | POA: Insufficient documentation

## 2016-10-08 DIAGNOSIS — E119 Type 2 diabetes mellitus without complications: Secondary | ICD-10-CM | POA: Diagnosis not present

## 2016-10-08 DIAGNOSIS — R634 Abnormal weight loss: Secondary | ICD-10-CM | POA: Diagnosis not present

## 2016-10-08 DIAGNOSIS — K2210 Ulcer of esophagus without bleeding: Secondary | ICD-10-CM | POA: Diagnosis not present

## 2016-10-08 DIAGNOSIS — K225 Diverticulum of esophagus, acquired: Secondary | ICD-10-CM | POA: Diagnosis not present

## 2016-10-08 DIAGNOSIS — R918 Other nonspecific abnormal finding of lung field: Secondary | ICD-10-CM | POA: Diagnosis not present

## 2016-10-08 DIAGNOSIS — R599 Enlarged lymph nodes, unspecified: Secondary | ICD-10-CM | POA: Diagnosis not present

## 2016-10-08 DIAGNOSIS — K5900 Constipation, unspecified: Secondary | ICD-10-CM | POA: Insufficient documentation

## 2016-10-08 DIAGNOSIS — E785 Hyperlipidemia, unspecified: Secondary | ICD-10-CM | POA: Insufficient documentation

## 2016-10-08 DIAGNOSIS — Z79899 Other long term (current) drug therapy: Secondary | ICD-10-CM | POA: Diagnosis not present

## 2016-10-08 DIAGNOSIS — D693 Immune thrombocytopenic purpura: Secondary | ICD-10-CM | POA: Insufficient documentation

## 2016-10-08 DIAGNOSIS — K921 Melena: Secondary | ICD-10-CM | POA: Insufficient documentation

## 2016-10-08 DIAGNOSIS — C787 Secondary malignant neoplasm of liver and intrahepatic bile duct: Secondary | ICD-10-CM | POA: Insufficient documentation

## 2016-10-08 HISTORY — DX: Malignant neoplasm of rectosigmoid junction: C19

## 2016-10-08 LAB — COMPREHENSIVE METABOLIC PANEL
ALT: 141 U/L — ABNORMAL HIGH (ref 17–63)
AST: 73 U/L — ABNORMAL HIGH (ref 15–41)
Albumin: 3.4 g/dL — ABNORMAL LOW (ref 3.5–5.0)
Alkaline Phosphatase: 473 U/L — ABNORMAL HIGH (ref 38–126)
Anion gap: 9 (ref 5–15)
BUN: 14 mg/dL (ref 6–20)
CO2: 23 mmol/L (ref 22–32)
Calcium: 9 mg/dL (ref 8.9–10.3)
Chloride: 101 mmol/L (ref 101–111)
Creatinine, Ser: 0.91 mg/dL (ref 0.61–1.24)
GFR calc Af Amer: >60 mL/min (ref >60)
GFR calc non Af Amer: >60 mL/min (ref >60)
Glucose, Bld: 175 mg/dL — ABNORMAL HIGH (ref 65–99)
Potassium: 3.3 mmol/L — ABNORMAL LOW (ref 3.5–5.1)
Sodium: 133 mmol/L — ABNORMAL LOW (ref 135–145)
Total Bilirubin: 0.3 mg/dL (ref 0.3–1.2)
Total Protein: 7.7 g/dL (ref 6.5–8.1)

## 2016-10-08 LAB — CBC WITH DIFFERENTIAL/PLATELET
Basophils Absolute: 0.1 10*3/uL (ref 0–0.1)
Basophils Relative: 1 %
Eosinophils Absolute: 0.2 10*3/uL (ref 0–0.7)
Eosinophils Relative: 3 %
HCT: 36.9 % — ABNORMAL LOW (ref 40.0–52.0)
Hemoglobin: 12.5 g/dL — ABNORMAL LOW (ref 13.0–18.0)
Lymphocytes Relative: 20 %
Lymphs Abs: 1.5 10*3/uL (ref 1.0–3.6)
MCH: 30.6 pg (ref 26.0–34.0)
MCHC: 34 g/dL (ref 32.0–36.0)
MCV: 89.9 fL (ref 80.0–100.0)
Monocytes Absolute: 0.7 10*3/uL (ref 0.2–1.0)
Monocytes Relative: 10 %
Neutro Abs: 5.2 10*3/uL (ref 1.4–6.5)
Neutrophils Relative %: 66 %
Platelets: 122 10*3/uL — ABNORMAL LOW (ref 150–440)
RBC: 4.1 MIL/uL — ABNORMAL LOW (ref 4.40–5.90)
RDW: 13.5 % (ref 11.5–14.5)
WBC: 7.7 10*3/uL (ref 3.8–10.6)

## 2016-10-08 LAB — FERRITIN: Ferritin: 73 ng/mL (ref 24–336)

## 2016-10-08 LAB — IRON AND TIBC
Iron: 51 ug/dL (ref 45–182)
Saturation Ratios: 18 % (ref 17.9–39.5)
TIBC: 284 ug/dL (ref 250–450)
UIBC: 234 ug/dL

## 2016-10-08 NOTE — Progress Notes (Signed)
[facility] day:  10/08/2016  Chief Complaint: Kire Ferg is a 65 y.o. male with a history of ITP who is seen for reassessment secondary to metastatic colorectal cancer.  HPI: The patient was last seen in the medical oncology clinic on 03/27/2015. At that time he was seen for chronic ITP.  Platelet count was 65,000. Abdominal ultrasound on 03/10/2015 revealed a normal spleen. Liver was unremarkable.  He was to return for follow-up in 3 months. He was lost to follow-up.  He states that he has been having "colon issues" since 05/2016. He describes initially having 4-5 formed formed bowel movements a day. Stools became "striped with blood". He was seen at the [facility] clinic in 07/2016. Fecal occult blood testing was positive (guaiac cards were negative 3 years ago). He had never had a colonoscopy.  He was referred to Dr. Merri Brunette.  He was seen by Stephens November, NP on 09/26/2016.  Exam revealed an irregular mass 4 inches into the rectal vault.  Hepatic panel revealed an AST 34, ALT 63, and alkaline phosphatase of 216.  Hepatitis A testing was positive.  Hepatitis B surface antibody was positive.  Hepatitis C antibody was negative.  He was scheduled for CT scans and colonoscopy.  Abdomen and pelvic CT scan on 09/26/2016 revealed innumerable ill-defined liver lesions compatible with metastatic disease. There were pulmonary nodules in the right lower lobe suggestive of metastatic involvement.  Rectal wall appeared thickened and irregular raising the concern for neoplasm. There was perirectal edema/inflammation and some small perirectal lymph nodes. There was necrotic lymphadenopathy in the upper abdomen with periaortic retroperitoneal adenopathy.  He underwent EGD and colonoscopy on 10/04/2016 by Dr. Loistine Simas.  EGD revealed LA Grade A erosive esophagitis, gastritis, and a normal duodenum.  Pathology from the antrum revealed mild  chronic gastritis negative or H. pylori, dysplasia and malignancy. GE junction biopsy revealed reflux gastroesophagitis.  Colonoscopy on 10/04/2016 revealed a malignant partially obstructing tumor in the recto-sigmoid colon.  The mass was circumferential and measured 9 cm in length.  At about 18 cm from the anal verge, stenosis prevented the scope to be passed further proximally.  Pathology revealed moderately differentiated invasive adenocarcinoma.  He was referred to a colorectal surgeon.  He notes that for the past 2 months, he has struggled to have bowel movements.  He has a few tablespoons of stool at a time.  He denies any nausea or vomiting.  He has lost 6-7 pounds.   Past Medical History:  Diagnosis Date  . Cataract 08/2015   forming  . Diabetes mellitus without complication ([place])   . Hyperlipidemia   . Hypertension   . Platelets decreased ([place])     Past Surgical History:  Procedure Laterality Date  . CARDIAC CATHETERIZATION  2003  . COLONOSCOPY N/A 10/04/2016   Procedure: COLONOSCOPY;  Surgeon: Lollie Sails, MD;  Location: [facility] ENDOSCOPY;  Service: Endoscopy;  Laterality: N/A;  . ESOPHAGOGASTRODUODENOSCOPY (EGD) WITH PROPOFOL N/A 10/04/2016   Procedure: ESOPHAGOGASTRODUODENOSCOPY (EGD) WITH PROPOFOL;  Surgeon: Lollie Sails, MD;  Location: [facility] ENDOSCOPY;  Service: Endoscopy;  Laterality: N/A;    Family History  Problem Relation Age of Onset  . Cancer Brother     Social History:  reports that he quit smoking about 21 years ago. His smoking use included Cigarettes. He quit after 10.00 years of use. He has never used smokeless tobacco. He reports that he drinks alcohol. He reports that he does  not use drugs.  He works in Charity fundraiser at [employer].  The patient is accompanied by the Spanish interpreter today.  Allergies: No Known Allergies  Current Medications: Current Outpatient Prescriptions  Medication Sig Dispense Refill  . atorvastatin (LIPITOR) 40 MG tablet  Take 40 mg by mouth daily. Reported on 05/09/2016    . enalapril (VASOTEC) 10 MG tablet Take 10 mg by mouth daily.    Marland Kitchen glipiZIDE (GLUCOTROL) 5 MG tablet Take 5 mg by mouth daily after supper.     . metFORMIN (GLUCOPHAGE) 500 MG tablet Take 1,000 mg by mouth 2 (two) times daily. Reported on 08/22/17/2017- taking 2-579m bid Metformin- causing extreme number of bowel movements and abd. Pain Reported on 09/05/16- now taking 2 tabs q am and 1 tab in the pm- diarrhea has decreased.    .Marland Kitchenloperamide (IMODIUM) 2 MG capsule Take by mouth.     No current facility-administered medications for this visit.     Review of Systems:  GENERAL:  Feels "fine".  Active and working.  No fevers or sweats.  Weight loss of 6-7 pounds. PERFORMANCE STATUS (ECOG):  0 HEENT:  No visual changes, runny nose, sore throat, mouth sores or tenderness. Lungs: No shortness of breath or cough.  No hemoptysis. Cardiac:  No chest pain, palpitations, orthopnea, or PND. GI:  Constipation.  Hematochezia.  No nausea, vomiting, diarrhea, or melena. GU:  No urgency, frequency, dysuria, or hematuria. Musculoskeletal:  No back pain.  No joint pain.  No muscle tenderness. Extremities:  No pain or swelling. Skin:  No rashes or skin changes. Neuro:  No headache, numbness or weakness, balance or coordination issues. Endocrine:  Diabetes.  No tthyroid issues, hot flashes or night sweats. Psych:  No mood changes, depression or anxiety. Pain:  No focal pain. Review of systems:  All other systems reviewed and found to be negative.  Physical Exam: Blood pressure 119/72, pulse 80, temperature 98 F (36.7 C), temperature source Tympanic, resp. rate 18, height '5\' 3"'  (1.6 m), weight 144 lb 10 oz (65.6 kg). GENERAL:  Well developed, well nourished, gentleman sitting comfortably in the exam room in no acute distress. MENTAL STATUS:  Alert and oriented to person, place and time. HEAD:  Wearing a black cap.  Alopecia.  Normocephalic, atraumatic, face  symmetric, no Cushingoid features. EYES:  Glasses.  Brown eyes.  Pupils equal round and reactive to light and accomodation.  No conjunctivitis or scleral icterus. ENT:  Oropharynx clear without lesion.  Tongue normal. Mucous membranes moist.  RESPIRATORY:  Clear to auscultation without rales, wheezes or rhonchi. CARDIOVASCULAR:  Regular rate and rhythm without murmur, rub or gallop. ABDOMEN:  Soft, non-tender, with active bowel sounds, and no hepatosplenomegaly.  No masses. RECTAL:  Deferred (recently performed by GI). SKIN:  No rashes, ulcers or lesions. EXTREMITIES: No edema, no skin discoloration or tenderness.  No palpable cords. LYMPH NODES: No palpable cervical, supraclavicular, axillary or inguinal adenopathy  NEUROLOGICAL: Unremarkable. PSYCH:  Appropriate.   No visits with results within 3 Day(s) from this visit.  Latest known visit with results is:  Admission on 10/04/2016, Discharged on 10/04/2016  Component Date Value Ref Range Status  . Glucose-Capillary 10/04/2016 101* 65 - 99 mg/dL Final  . Comment 1 10/04/2016 Document in Chart   Final  . SURGICAL PATHOLOGY 10/05/2016    Final                   Value:Surgical Pathology CASE: [id number]: FGracy RacerSurgical Pathology  Report     SPECIMEN SUBMITTED: A. Stomach, antrum and body; cbx B. GEJ; cbx C. Rectal mass; cbx  CLINICAL HISTORY: None provided  PRE-OPERATIVE DIAGNOSIS: Epigastric ABD pain, rectum mass, abnormal ABD pelvis  POST-OPERATIVE DIAGNOSIS: Zenker's diverticulum, minimal gastritis, grade A esophagitis, rectal mass     DIAGNOSIS: A. STOMACH, ANTRUM AND BODY; COLD BIOPSY: - ANTRAL MUCOSA WITH MILD CHRONIC GASTRITIS. - OXYNTIC MUCOSA WITH MINIMAL CHRONIC GASTRITIS. - NEGATIVE FOR H. PYLORI, DYSPLASIA AND MALIGNANCY.  B.  GE JUNCTION; COLD BIOPSY: - REFLUX GASTROESOPHAGITIS. - NEGATIVE FOR GOBLET CELLS, DYSPLASIA AND MALIGNANCY.  C. RECTAL MASS; COLD BIOPSY: -  INVASIVE ADENOCARCINOMA, MODERATELY DIFFERENTIATED.   GROSS DESCRIPTION: A.Labeled: Antrum and body of stomach cold biopsy  Tissue fragment(s): 2  Size: 0.2-0.3 cm  Description: Pink-tan tissue fragme                         nts  Entirely submitted in one cassette(s).   B. Labeled: GEJ cold biopsy  Tissue fragment(s): 3  Size: Each 0.2 cm  Description: Pink white tissue fragments  Entirely submitted in one cassette(s).   C. Labeled: Rectal mass cold biopsy  Tissue fragment(s): Multiple  Size: 1.2 x 0.6 x 0.2 cm  Description: Pink red tissue fragments  Entirely submitted in one cassette(s).     Final Diagnosis performed by Delorse Lek, MD.  Electronically signed 10/05/2016 2:46:59PM    The electronic signature indicates that the named Attending Pathologist has evaluated the specimen  Technical component performed at [facility], [address] [place], [place] [address] Lab: [phone] Dir: Darrick Penna. Evette Doffing, MD  Professional component performed at [facility], [facility], [place], [place], [place] [address] Lab: [phone] Dir: Dellia Nims. Reuel Derby, MD      Assessment:  Tyce Delcid is a 65 y.o. male with metastatic colorectal carcinoma.  He presented with a 4 month history of rectal bleeding.  Abdomen and pelvic CT scan on 09/26/2016 revealed innumerable ill-defined liver lesions compatible with metastatic disease. There were pulmonary nodules in the right lower lobe suggestive of metastatic involvement.  Rectal wall appeared thickened and irregular raising the concern for neoplasm. There was perirectal edema/inflammation and some small perirectal lymph nodes. There was necrotic lymphadenopathy in the upper abdomen with periaortic retroperitoneal adenopathy.  Colonoscopy on 10/04/2016 revealed a malignant partially obstructing tumor in the recto-sigmoid colon.  The mass was circumferential and measured 9 cm in length.  At  about 18 cm from the anal verge, stenosis prevented the scope to be passed further proximally.  Pathology revealed moderately differentiated invasive adenocarcinoma.  EGD on 10/04/2016 revealed LA Grade A erosive esophagitis, gastritis, and a normal duodenum.  He has a history of chronic ITP.  Previously his platelet count ranged between 50,000 and 60,000. Work-up on 10/12/2014 revealed the following negative studies: ANA, hepatitis B surface antigen, hepatitis C antibody, HIV testing, ANA, SPEP, UPEP, B12, folate, and LDH.   Symptomatically, he has struggled to have bowel movements.  He denies any nausea or vomiting.  He has lost 6-7 pounds.  Prior exam reveals a palpable rectal mass.  Plan: 1.  Reports of imaging studies, colonoscopy and pathology report were reviewed with the patient. He was unaware of his diagnosis of colorectal cancer. We discussed concern for metastatic disease to the liver. Imaging studies were reviewed with the patient in the clinic.  We discussed concern for pending obstruction and surgery referral for possible diverting colostomy.  Patient noted appointment  on Friday, 10/12/2016, for biopsy results.  He was noted to have an appointment with surgeon (not in clinic today), but spoke with one of his partners (Dr Adonis Huguenin) for expedited work-up.  Discuss plan for systemic chemotherapy (FOLFOX +/- Avastin or cetuximab).  Side effects of chemotherapy reviewed.  Discussed need for port placement.  Discuss chest CT to complete staging.  Labs today.  2.  Labs today:  CBC with diff, CMP, ferritin, iron studies, CEA. 3.  Schedule chest CT. 4.  Send tissue for KRAS, NRAS, and BRAF mutational testing + microsatellite instability (MSI) / mismatch repair (MMR). 5.  Phone follow-up with surgery- done. 6.  Present at tumor board on 10/11/2016. 7.  RTC after above.   Lequita Asal, MD  10/08/2016, 3:20 PM

## 2016-10-08 NOTE — Progress Notes (Signed)
Patient here today regarding colon mass.  Established patient.

## 2016-10-09 ENCOUNTER — Encounter: Payer: Self-pay | Admitting: Hematology and Oncology

## 2016-10-09 LAB — CEA: CEA: 8 ng/mL — ABNORMAL HIGH (ref 0.0–4.7)

## 2016-10-10 ENCOUNTER — Other Ambulatory Visit: Payer: Self-pay | Admitting: Pathology

## 2016-10-10 ENCOUNTER — Ambulatory Visit: Payer: 59 | Admitting: Surgery

## 2016-10-10 DIAGNOSIS — C20 Malignant neoplasm of rectum: Secondary | ICD-10-CM

## 2016-10-11 ENCOUNTER — Ambulatory Visit (INDEPENDENT_AMBULATORY_CARE_PROVIDER_SITE_OTHER): Payer: 59 | Admitting: Surgery

## 2016-10-11 ENCOUNTER — Encounter: Payer: Self-pay | Admitting: Surgery

## 2016-10-11 ENCOUNTER — Other Ambulatory Visit: Payer: Self-pay

## 2016-10-11 ENCOUNTER — Encounter
Admission: RE | Admit: 2016-10-11 | Discharge: 2016-10-11 | Disposition: A | Discharge status: Home / Self Care | Payer: 59 | Source: Ambulatory Visit | Attending: Surgery | Admitting: Surgery

## 2016-10-11 ENCOUNTER — Telehealth: Payer: Self-pay | Admitting: Surgery

## 2016-10-11 ENCOUNTER — Inpatient Hospital Stay: Payer: 59 | Attending: Pathology

## 2016-10-11 ENCOUNTER — Telehealth: Payer: Self-pay

## 2016-10-11 DIAGNOSIS — Z7984 Long term (current) use of oral hypoglycemic drugs: Secondary | ICD-10-CM | POA: Diagnosis not present

## 2016-10-11 DIAGNOSIS — C20 Malignant neoplasm of rectum: Secondary | ICD-10-CM

## 2016-10-11 DIAGNOSIS — Z809 Family history of malignant neoplasm, unspecified: Secondary | ICD-10-CM | POA: Insufficient documentation

## 2016-10-11 DIAGNOSIS — N281 Cyst of kidney, acquired: Secondary | ICD-10-CM | POA: Diagnosis not present

## 2016-10-11 DIAGNOSIS — C787 Secondary malignant neoplasm of liver and intrahepatic bile duct: Secondary | ICD-10-CM | POA: Insufficient documentation

## 2016-10-11 DIAGNOSIS — R1011 Right upper quadrant pain: Secondary | ICD-10-CM | POA: Insufficient documentation

## 2016-10-11 DIAGNOSIS — E119 Type 2 diabetes mellitus without complications: Secondary | ICD-10-CM | POA: Insufficient documentation

## 2016-10-11 DIAGNOSIS — C19 Malignant neoplasm of rectosigmoid junction: Secondary | ICD-10-CM

## 2016-10-11 DIAGNOSIS — Z87891 Personal history of nicotine dependence: Secondary | ICD-10-CM | POA: Insufficient documentation

## 2016-10-11 DIAGNOSIS — E118 Type 2 diabetes mellitus with unspecified complications: Secondary | ICD-10-CM

## 2016-10-11 DIAGNOSIS — D693 Immune thrombocytopenic purpura: Secondary | ICD-10-CM | POA: Insufficient documentation

## 2016-10-11 DIAGNOSIS — Z79899 Other long term (current) drug therapy: Secondary | ICD-10-CM | POA: Insufficient documentation

## 2016-10-11 DIAGNOSIS — I10 Essential (primary) hypertension: Secondary | ICD-10-CM | POA: Insufficient documentation

## 2016-10-11 DIAGNOSIS — E785 Hyperlipidemia, unspecified: Secondary | ICD-10-CM

## 2016-10-11 DIAGNOSIS — R152 Fecal urgency: Secondary | ICD-10-CM | POA: Insufficient documentation

## 2016-10-11 DIAGNOSIS — K2970 Gastritis, unspecified, without bleeding: Secondary | ICD-10-CM | POA: Insufficient documentation

## 2016-10-11 DIAGNOSIS — C189 Malignant neoplasm of colon, unspecified: Secondary | ICD-10-CM | POA: Insufficient documentation

## 2016-10-11 DIAGNOSIS — R918 Other nonspecific abnormal finding of lung field: Secondary | ICD-10-CM | POA: Insufficient documentation

## 2016-10-11 DIAGNOSIS — E876 Hypokalemia: Secondary | ICD-10-CM | POA: Insufficient documentation

## 2016-10-11 DIAGNOSIS — R17 Unspecified jaundice: Secondary | ICD-10-CM | POA: Insufficient documentation

## 2016-10-11 DIAGNOSIS — Z5111 Encounter for antineoplastic chemotherapy: Secondary | ICD-10-CM | POA: Insufficient documentation

## 2016-10-11 DIAGNOSIS — K2210 Ulcer of esophagus without bleeding: Secondary | ICD-10-CM | POA: Insufficient documentation

## 2016-10-11 DIAGNOSIS — D696 Thrombocytopenia, unspecified: Secondary | ICD-10-CM | POA: Insufficient documentation

## 2016-10-11 DIAGNOSIS — E871 Hypo-osmolality and hyponatremia: Secondary | ICD-10-CM | POA: Insufficient documentation

## 2016-10-11 MED ORDER — POLYETHYLENE GLYCOL POWD: 17.0000 g | Freq: Every day | 5 refills | Status: DC

## 2016-10-11 MED ORDER — OMEPRAZOLE 20 MG PO CPDR: 20.0000 mg | DELAYED_RELEASE_CAPSULE | Freq: Every day | ORAL | 3 refills | Status: DC

## 2016-10-11 MED ORDER — POTASSIUM CHLORIDE ER 10 MEQ PO TBCR: 40.0000 meq | EXTENDED_RELEASE_TABLET | Freq: Every day | ORAL | 0 refills | Status: DC

## 2016-10-11 MED ORDER — CEFAZOLIN SODIUM-DEXTROSE 2-4 GM/100ML-% IV SOLN: 2.0000 g | INTRAVENOUS | Status: DC

## 2016-10-11 NOTE — Patient Instructions (Signed)
Por favor mire a su hoja azul si tiene Teacher, English as a foreign language.  Manana el Dr. [name] le va a colocar su port-a-cath para que pueda empezar con so quimoterapia.

## 2016-10-11 NOTE — Patient Instructions (Addendum)
Your procedure is scheduled on: 10/12/16 Su procedimiento est programado para: Report to Day Surgery. 2nd floor medical mall entrance Presntese a: To find out your arrival time please call [phone] between 1PM - 3PM on . Para saber su hora de llegada por favor llame al [phone] entre la 1PM - 3PM el da:  Remember: Instructions that are not followed completely may result in serious medical risk, up to and including death, or upon the discretion of your surgeon and anesthesiologist your surgery may need to be rescheduled.  Recuerde: Las instrucciones que no se siguen completamente Heritage manager en un riesgo de salud grave, incluyendo hasta la [place] o a discrecin de su cirujano y Environmental health practitioner, su ciruga se puede posponer.   __x__ 1. Do not eat food or drink liquids after midnight. No gum chewing or hard candies.  No coma alimentos ni tome lquidos despus de la medianoche.  No mastique chicle ni caramelos  duros.     __x__ 2. No alcoho/no smokingl for 24 hours before or after surgery.    No tome alcohol durante las 24 horas antes ni despus de la [place].   ____ 3. Bring all medications with you on the day of surgery if instructed.    Lleve todos los medicamentos con usted el da de su ciruga si se le ha indicado as.   __x__ 4. Notify your doctor if there is any change in your medical condition (cold, fever,                             infections).    Informe a su mdico si hay algn cambio en su condicin mdica (resfriado, fiebre, infecciones).   Do not wear jewelry, make-up, hairpins, clips or nail polish.  No use joyas, maquillajes, pinzas/ganchos para el cabello ni esmalte de uas.  Do not wear lotions, powders, or perfumes. You may wear deodorant.  No use lociones, polvos o perfumes.  Puede usar desodorante.    Do not shave 48 hours prior to surgery. Men may shave face and neck.  No se afeite 48 horas antes de la [place].  Los hombres pueden [employer] cara  y el cuello.   Do not bring valuables to the hospital.   No lleve objetos [facility] is not responsible for any belongings or valuables.  [place] no se hace responsable de ningn tipo de pertenencias u objetos de Geographical information systems officer.               Contacts, dentures or bridgework may not be worn into surgery.  Los lentes de [place], las dentaduras postizas o puentes no se pueden usar en la [place].  Leave your suitcase in the car. After surgery it may be brought to your room.  Deje su maleta en el auto.  Despus de la ciruga podr traerla a su habitacin.  For patients admitted to the hospital, discharge time is determined by your treatment team.  Para los pacientes que sean ingresados al hospital, el tiempo en el cual se le dar de alta es determinado por su                equipo de [place].   Patients discharged the day of surgery will not be allowed to drive home. A los pacientes que se les da de alta el mismo da de la ciruga no se les permitir conducir a Holiday representative.   Please read over  the following fact sheets that you were given: Por favor [place] hojas de informacin que le dieron:   Pain Booklet   __x__ Take these medicines the morning of surgery with A SIP OF WATER:          [employer] medicinas la maana de la ciruga con UN SORBO DE AGUA:  1. omeprazole  2.   3.   4.       5.  6.  ____ Fleet Enema (as directed)          Enema de Fleet (segn lo indicado)    __x__ Use CHG Soap as directed          Utilice el jabn de CHG segn lo indicado  ____ Use inhalers on the day of surgery          Use los inhaladores el da de la ciruga  __x__ Stop metformin 2 days prior to surgery hold tonight          Deje de tomar el metformin 2 das antes de la ciruga    ____ Take 1/2 of usual insulin dose the night before surgery and none on the morning of surgery           Tome la mitad de la dosis habitual de insulina la noche antes de la [place] y no  tome nada en la maana de la             ciruga  ____ Stop Coumadin/Plavix/aspirin on           Deje de tomar el Coumadin/Plavix/aspirina el da:  ____ Stop Anti-inflammatories on           Deje de tomar antiinflamatorios el da:   ____ Stop supplements until after surgery            Deje de tomar suplementos hasta despus de la [place]uga  ____ Bring C-Pap to the hospital          [place] al hospital

## 2016-10-11 NOTE — Telephone Encounter (Signed)
Pt advised of pre op date/time and sx date. Sx: 10/12/16 with Dr Stephanie Acre Placement.  Pre op: 10/11/16 @ 12:00pm--Office.   Patient made aware to call [phone], between 1-3:00pm the day before surgery, to find out what time to arrive.

## 2016-10-11 NOTE — Telephone Encounter (Signed)
Patient's disability forms were filled out and faxed to [facility] and [employer].

## 2016-10-11 NOTE — Progress Notes (Signed)
10/11/2016  Reason for Visit:  Metastatic rectal cancer  History of Present Illness: Nathan Atkinson is a 65 y.o. male who was recently diagnosed with metastatic rectal cancer.  Colonoscopy was attempted on 10/26 which was not able to traverse the mass but biopsy was obtained which showed moderately differentiated invasive adenocarcinoma.  CT scan of abdomen and pelvis revealed multiple liver mets as well.  He was referred to Surgery for port-a-cath placement for chemotherapy as well as for possible obstructive symptoms from the rectal mass that may require diversion.  He denies feeling distended or bloated.  Denies any nausea or vomiting.  Reports having multiple small bowel movements per day (from a baseline of 4 per day prior to any issues).  He reports urgency feeling and then having a small amount of stool.  Describes some discomfort associated with this which is relieved after each bowel movement.  However, he denies any significant pain.  Does note some red color to the stool but no frank bleeding.  Looking at his records, has had a 9 lb weight loss over the past year.   Past Medical History: Past Medical History:  Diagnosis Date  . Cataract 08/2015   forming  . Colorectal cancer ([place]) 10/08/2016  . Diabetes mellitus without complication ([place])   . Hyperlipidemia   . Hypertension   . Platelets decreased ([place])      Past Surgical History: Past Surgical History:  Procedure Laterality Date  . CARDIAC CATHETERIZATION  2003  . COLONOSCOPY N/A 10/04/2016   Procedure: COLONOSCOPY;  Surgeon: Lollie Sails, MD;  Location: [facility] ENDOSCOPY;  Service: Endoscopy;  Laterality: N/A;  . ESOPHAGOGASTRODUODENOSCOPY (EGD) WITH PROPOFOL N/A 10/04/2016   Procedure: ESOPHAGOGASTRODUODENOSCOPY (EGD) WITH PROPOFOL;  Surgeon: Lollie Sails, MD;  Location: [facility] ENDOSCOPY;  Service: Endoscopy;  Laterality: N/A;    Home Medications: Prior to Admission medications   Medication Sig Start  Date End Date Taking? Authorizing Provider  atorvastatin (LIPITOR) 40 MG tablet Take 40 mg by mouth daily. Reported on 05/09/2016   Yes Historical Provider, MD  enalapril (VASOTEC) 10 MG tablet Take 10 mg by mouth daily.   Yes Historical Provider, MD  glipiZIDE (GLUCOTROL) 5 MG tablet Take 5 mg by mouth daily after supper.    Yes Historical Provider, MD  metFORMIN (GLUCOPHAGE) 500 MG tablet Take 1,000 mg by mouth 2 (two) times daily. Reported on 08/22/17/2017- taking 2-575m bid Metformin- causing extreme number of bowel movements and abd. Pain Reported on 09/05/16- now taking 2 tabs q am and 1 tab in the pm- diarrhea has decreased.   Yes Historical Provider, MD  omeprazole (PRILOSEC) 20 MG capsule Take 1 capsule (20 mg total) by mouth daily. 10/11/16   JOlean Ree MD  Polyethylene Glycol POWD Take 17 g by mouth daily. 10/11/16   JOlean Ree MD  potassium chloride (K-DUR) 10 MEQ tablet Take 4 tablets (40 mEq total) by mouth daily. 10/11/16   JOlean Ree MD    Allergies: No Known Allergies  Social History:  reports that he quit smoking about 21 years ago. His smoking use included Cigarettes. He quit after 10.00 years of use. He has never used smokeless tobacco. He reports that he drinks alcohol. He reports that he does not use drugs.   Family History: Family History  Problem Relation Age of Onset  . Cancer Brother     not sure what kind    Review of Systems: Review of Systems  Constitutional: Negative for chills and fever.  Eyes:  Negative for blurred vision.  Respiratory: Negative for cough and shortness of breath.   Cardiovascular: Negative for chest pain and leg swelling.  Gastrointestinal: Positive for blood in stool. Negative for abdominal pain (only some discomfort with bowel movements), constipation, diarrhea, heartburn, nausea and vomiting.  Genitourinary: Negative for dysuria and hematuria.  Musculoskeletal: Negative for myalgias.  Skin: Negative for rash.  Neurological:  Negative for dizziness and headaches.  Psychiatric/Behavioral: Negative for depression.    Physical Exam BP 125/74   Pulse 76   Temp 98.3 F (36.8 C) (Oral)   Ht '5\' 3"'  (1.6 m)   Wt 64.4 kg (142 lb)   BMI 25.15 kg/m  CONSTITUTIONAL: No acute distress HEENT:  Normocephalic, atraumatic, extraocular motion intact. NECK: Trachea is midline, and there is no jugular venous distension.  LYMPH NODES:  Lymph nodes in the neck are not enlarged. RESPIRATORY:  Lungs are clear, and breath sounds are equal bilaterally. Normal respiratory effort without pathologic use of accessory muscles. CARDIOVASCULAR: Heart is regular without murmurs, gallops, or rubs. GI: The abdomen is soft, non-distended, non-tender to palpation. No peritoneal signs.  There were no palpable masses. Rectal:  No gross blood per rectum.  Palpable rectal mass at the fingertip level, about 5 cm from anal verge consistent with large rectal mass on CT scan and colonoscopy.  Non-tender on exam. MUSCULOSKELETAL:  Normal muscle strength and tone in all four extremities.  No peripheral edema or cyanosis. SKIN: Skin turgor is normal. There are no pathologic skin lesions.  NEUROLOGIC:  Motor and sensation is grossly normal.  Cranial nerves are grossly intact. PSYCH:  Alert and oriented to person, place and time. Affect is normal.  Laboratory Analysis: Notable for CEA of 8, WBC 7.7, Plt 122, Hct 36.9, tbili 0.3, AST 73, ALT 141, Alk Phos 473, Cr 0.91  Imaging: Abdomen/Pelvis CT scan from 10/18 showing: 1. Innumerable all ill-defined liver lesions compatible with metastatic disease. Pulmonary nodules in the right lower lobe also suggest metastatic involvement. 2. Although a discrete mass lesion is not evident, the rectal wall appears thickened and irregular raising concern for neoplasm. There is perirectal edema/inflammation and some small perirectal lymph nodes. 3. Necrotic lymphadenopathy in the upper abdomen with  para-aortic retroperitoneal lymphadenopathy associated. 4. Small right renal cysts.  Chest CT has been ordered for 11/6.    Colonoscopy 10/26: A fungating and infiltrative partially obstructing large mass was found in the recto-sigmoid colon. The mass was circumferential.  The mass measured nine cm in length. The lesion is friable to even rinsing. The scope was advanced to about 18 cm from the anal verge where a stenosis is seen in the mass.  Assessment and Plan: This is a 65 y.o. male who presents with metastatic rectal cancer.  Per CT scan and colonoscopy, this is a large mass which has protrusion extending down to about 5 cm from anal verge based on my rectal exam.  Based on the patient's description of his symptoms, I believe that the mass is causing a feeling of fullness of the rectum which in turn is causing him urgency to have a bowel movement.  He has discomfort from straining but only a small amount of stool comes at a time because the vault is actually not full of stool but of the mass instead.  He is not distended, not having any nausea, and is having flatus and bowel movements.  At this point, there is no urgency to have a diverting colostomy, and the priority should be to  start his chemotherapy.  He may benefit from radiation oncology consultation in the future as well.  I have discussed with the patient that if his symptoms were to worsen, if he develops any abdominal distention or nausea, or if he stops passing flatus or having bowel movements, that we will need to re-evaluate him for a diverting colostomy.  He agreed to this if need be in the future and will have close follow up with [place] in the clinic.   In the meantime, will plan for left sided port-a-cath placement for tomorrow 10/12/16.  Risks and benefits of the procedure were explained to the patient, including risk of bleeding, infection, injury to surrounding structures, pneumothorax and possible need for chest tube, and he was  willing to proceed.  He will go to pre-op testing today.  Instructions for NPO after midnight and when to come in for surgery were given to the patient.    Face-to-face time spent with the patient and care providers was 60 minutes, with more than 50% of the time spent counseling, educating, and coordinating care of the patient.    Melvyn Neth, [place]

## 2016-10-12 ENCOUNTER — Encounter
Admission: RE | Disposition: A | Discharge status: Home / Self Care | Payer: Self-pay | Source: Ambulatory Visit | Attending: Surgery

## 2016-10-12 ENCOUNTER — Ambulatory Visit: Payer: 59 | Admitting: Anesthesiology

## 2016-10-12 ENCOUNTER — Encounter: Payer: Self-pay | Admitting: *Deleted

## 2016-10-12 ENCOUNTER — Ambulatory Visit: Payer: 59

## 2016-10-12 ENCOUNTER — Ambulatory Visit
Admission: RE | Admit: 2016-10-12 | Discharge: 2016-10-12 | Disposition: A | Discharge status: Home / Self Care | Payer: 59 | Source: Ambulatory Visit | Attending: Surgery | Admitting: Surgery

## 2016-10-12 ENCOUNTER — Ambulatory Visit: Payer: Self-pay | Admitting: Surgery

## 2016-10-12 DIAGNOSIS — Z7984 Long term (current) use of oral hypoglycemic drugs: Secondary | ICD-10-CM | POA: Insufficient documentation

## 2016-10-12 DIAGNOSIS — C20 Malignant neoplasm of rectum: Secondary | ICD-10-CM

## 2016-10-12 DIAGNOSIS — Z87891 Personal history of nicotine dependence: Secondary | ICD-10-CM | POA: Diagnosis not present

## 2016-10-12 DIAGNOSIS — Z809 Family history of malignant neoplasm, unspecified: Secondary | ICD-10-CM | POA: Insufficient documentation

## 2016-10-12 DIAGNOSIS — N281 Cyst of kidney, acquired: Secondary | ICD-10-CM | POA: Insufficient documentation

## 2016-10-12 DIAGNOSIS — C787 Secondary malignant neoplasm of liver and intrahepatic bile duct: Secondary | ICD-10-CM | POA: Insufficient documentation

## 2016-10-12 DIAGNOSIS — Z95828 Presence of other vascular implants and grafts: Secondary | ICD-10-CM | POA: Diagnosis not present

## 2016-10-12 DIAGNOSIS — Z452 Encounter for adjustment and management of vascular access device: Secondary | ICD-10-CM | POA: Diagnosis not present

## 2016-10-12 DIAGNOSIS — I10 Essential (primary) hypertension: Secondary | ICD-10-CM | POA: Insufficient documentation

## 2016-10-12 DIAGNOSIS — E785 Hyperlipidemia, unspecified: Secondary | ICD-10-CM | POA: Diagnosis not present

## 2016-10-12 DIAGNOSIS — J9811 Atelectasis: Secondary | ICD-10-CM | POA: Diagnosis not present

## 2016-10-12 DIAGNOSIS — E119 Type 2 diabetes mellitus without complications: Secondary | ICD-10-CM | POA: Diagnosis not present

## 2016-10-12 HISTORY — PX: PORTACATH PLACEMENT: SHX2246

## 2016-10-12 LAB — POCT I-STAT 4, (NA,K, GLUC, HGB,HCT)
Glucose, Bld: 89 mg/dL (ref 65–99)
HEMATOCRIT: 36 % — AB (ref 39.0–52.0)
HEMOGLOBIN: 12.2 g/dL — AB (ref 13.0–17.0)
Potassium: 3.4 mmol/L — ABNORMAL LOW (ref 3.5–5.1)
SODIUM: 140 mmol/L (ref 135–145)

## 2016-10-12 LAB — GLUCOSE, CAPILLARY
Glucose-Capillary: 83 mg/dL (ref 65–99)
Glucose-Capillary: 77 mg/dL (ref 65–99)

## 2016-10-12 SURGERY — INSERTION, TUNNELED CENTRAL VENOUS DEVICE, WITH PORT
Anesthesia: Monitor Anesthesia Care | Laterality: Left | Wound class: Clean

## 2016-10-12 MED ORDER — ACETAMINOPHEN 500 MG PO TABS
1000.0000 mg | ORAL_TABLET | ORAL | Status: AC
  Administered 2016-10-12: 1000 mg via ORAL

## 2016-10-12 MED ORDER — CEFAZOLIN IN D5W 1 GM/50ML IV SOLN
INTRAVENOUS | Status: AC
  Administered 2016-10-12: 1 g
  Filled 2016-10-12: qty 50

## 2016-10-12 MED ORDER — HYDROCODONE-ACETAMINOPHEN 5-325 MG PO TABS: 1.0000 | ORAL_TABLET | ORAL | 0 refills | Status: DC | PRN

## 2016-10-12 MED ORDER — FENTANYL CITRATE (PF) 100 MCG/2ML IJ SOLN: 25.0000 ug | INTRAMUSCULAR | Status: DC | PRN

## 2016-10-12 MED ORDER — CHLORHEXIDINE GLUCONATE CLOTH 2 % EX PADS
6.0000 | MEDICATED_PAD | Freq: Once | CUTANEOUS | Status: AC
  Administered 2016-10-12: 6 via TOPICAL

## 2016-10-12 MED ORDER — SODIUM CHLORIDE 0.9 % IV SOLN
INTRAVENOUS | Status: DC
  Administered 2016-10-12: 13:00:00 via INTRAVENOUS

## 2016-10-12 MED ORDER — ACETAMINOPHEN 500 MG PO TABS
ORAL_TABLET | ORAL | Status: AC
  Administered 2016-10-12: 1000 mg via ORAL
  Filled 2016-10-12: qty 2

## 2016-10-12 MED ORDER — BUPIVACAINE HCL (PF) 0.5 % IJ SOLN
INTRAMUSCULAR | Status: AC
  Filled 2016-10-12: qty 30

## 2016-10-12 MED ORDER — PROPOFOL 500 MG/50ML IV EMUL
INTRAVENOUS | Status: DC | PRN
  Administered 2016-10-12: 150 ug/kg/min via INTRAVENOUS

## 2016-10-12 MED ORDER — ONDANSETRON HCL 4 MG/2ML IJ SOLN
INTRAMUSCULAR | Status: DC | PRN
  Administered 2016-10-12: 4 mg via INTRAVENOUS

## 2016-10-12 MED ORDER — EPHEDRINE SULFATE 50 MG/ML IJ SOLN
INTRAMUSCULAR | Status: DC | PRN
  Administered 2016-10-12 (×2): 10 mg via INTRAVENOUS

## 2016-10-12 MED ORDER — HEPARIN SODIUM (PORCINE) 5000 UNIT/ML IJ SOLN
INTRAMUSCULAR | Status: AC
  Filled 2016-10-12: qty 1

## 2016-10-12 MED ORDER — ONDANSETRON HCL 4 MG/2ML IJ SOLN: 4.0000 mg | Freq: Once | INTRAMUSCULAR | Status: DC | PRN

## 2016-10-12 MED ORDER — MIDAZOLAM HCL 2 MG/2ML IJ SOLN
INTRAMUSCULAR | Status: DC | PRN
Start: 2016-10-12 — End: 2016-10-12
  Administered 2016-10-12 (×2): 1 mg via INTRAVENOUS

## 2016-10-12 MED ORDER — LIDOCAINE HCL (PF) 1 % IJ SOLN
INTRAMUSCULAR | Status: DC | PRN
  Administered 2016-10-12: 5 mL

## 2016-10-12 MED ORDER — FENTANYL CITRATE (PF) 100 MCG/2ML IJ SOLN
INTRAMUSCULAR | Status: DC | PRN
  Administered 2016-10-12: 50 ug via INTRAVENOUS

## 2016-10-12 MED ORDER — SODIUM CHLORIDE 0.9 % IJ SOLN
INTRAMUSCULAR | Status: AC
  Filled 2016-10-12: qty 50

## 2016-10-12 MED ORDER — SODIUM CHLORIDE 0.9 % IV SOLN
INTRAVENOUS | Status: DC | PRN
  Administered 2016-10-12: 10 mL via INTRAMUSCULAR

## 2016-10-12 MED ORDER — LIDOCAINE HCL (PF) 1 % IJ SOLN
INTRAMUSCULAR | Status: AC
  Filled 2016-10-12: qty 30

## 2016-10-12 MED ORDER — BUPIVACAINE HCL (PF) 0.5 % IJ SOLN
INTRAMUSCULAR | Status: DC | PRN
  Administered 2016-10-12: 5 mL

## 2016-10-12 SURGICAL SUPPLY — 29 items
BLADE SURG SZ11 CARB STEEL (BLADE) ×2 IMPLANT
CANISTER SUCT 1200ML W/VALVE (MISCELLANEOUS) ×2 IMPLANT
CHLORAPREP W/TINT 26ML (MISCELLANEOUS) ×2 IMPLANT
COVER LIGHT HANDLE STERIS (MISCELLANEOUS) ×4 IMPLANT
DECANTER SPIKE VIAL GLASS SM (MISCELLANEOUS) ×6 IMPLANT
DERMABOND ADVANCED (GAUZE/BANDAGES/DRESSINGS) ×1
DERMABOND ADVANCED .7 DNX12 (GAUZE/BANDAGES/DRESSINGS) ×1 IMPLANT
DRAPE C-ARM XRAY 36X54 (DRAPES) ×2 IMPLANT
ELECT REM PT RETURN 9FT ADLT (ELECTROSURGICAL) ×2
ELECTRODE REM PT RTRN 9FT ADLT (ELECTROSURGICAL) ×1 IMPLANT
GLOVE SURG SYN 7.0 (GLOVE) ×2 IMPLANT
GLOVE SURG SYN 7.5  E (GLOVE) ×1
GLOVE SURG SYN 7.5 E (GLOVE) ×1 IMPLANT
GOWN STRL REUS W/ TWL LRG LVL3 (GOWN DISPOSABLE) ×2 IMPLANT
GOWN STRL REUS W/TWL LRG LVL3 (GOWN DISPOSABLE) ×2
KIT PORT POWER 8FR ISP CVUE (Catheter) ×2 IMPLANT
KIT RM TURNOVER STRD PROC AR (KITS) ×2 IMPLANT
LABEL OR SOLS (LABEL) ×2 IMPLANT
NDL SAFETY 22GX1.5 (NEEDLE) ×2 IMPLANT
NEEDLE FILTER BLUNT 18X 1/2SAF (NEEDLE) ×1
NEEDLE FILTER BLUNT 18X1 1/2 (NEEDLE) ×1 IMPLANT
NS IRRIG 500ML POUR BTL (IV SOLUTION) ×2 IMPLANT
PACK PORT-A-CATH (MISCELLANEOUS) ×2 IMPLANT
SUT MNCRL AB 4-0 PS2 18 (SUTURE) ×2 IMPLANT
SUT PROLENE 3 0 SH DA (SUTURE) ×2 IMPLANT
SUT VIC AB 3-0 SH 27 (SUTURE) ×1
SUT VIC AB 3-0 SH 27X BRD (SUTURE) ×1 IMPLANT
SYR 3ML LL SCALE MARK (SYRINGE) ×2 IMPLANT
SYRINGE 10CC LL (SYRINGE) ×2 IMPLANT

## 2016-10-12 NOTE — Transfer of Care (Signed)
Immediate Anesthesia Transfer of Care Note  Patient: Gracy Racer  Procedure(s) Performed: Procedure(s): INSERTION PORT-A-CATH (Left)  Patient Location: PACU  Anesthesia Type:MAC  Level of Consciousness: awake, oriented and patient cooperative  Airway & Oxygen Therapy: Patient Spontanous Breathing and Patient connected to nasal cannula oxygen  Post-op Assessment: Report given to RN and Post -op Vital signs reviewed and stable  Post vital signs: Reviewed and stable  Last Vitals:  Vitals:   10/12/16 1153  BP: 129/67  Pulse: 65  Resp: 20  Temp: 36.6 C    Last Pain:  Vitals:   10/12/16 1153  TempSrc: Oral  PainSc: 2          Complications: No apparent anesthesia complications

## 2016-10-12 NOTE — Anesthesia Preprocedure Evaluation (Signed)
Anesthesia Evaluation  Patient identified by MRN, date of birth, ID band Patient awake    Reviewed: Allergy & Precautions, NPO status , Patient's Chart, lab work & pertinent test results  Airway Mallampati: II       Dental  (+) Teeth Intact   Pulmonary neg pulmonary ROS, former smoker,    breath sounds clear to auscultation       Cardiovascular Exercise Tolerance: Good hypertension, Pt. on medications  Rhythm:Regular Rate:Normal     Neuro/Psych negative neurological ROS     GI/Hepatic negative GI ROS, Neg liver ROS, Hepatic mets   Endo/Other  diabetes, Type 2, Oral Hypoglycemic Agents  Renal/GU      Musculoskeletal   Abdominal Normal abdominal exam  (+)   Peds  Hematology negative hematology ROS (+)   Anesthesia Other Findings   Reproductive/Obstetrics                             Anesthesia Physical Anesthesia Plan  ASA: II  Anesthesia Plan: General and MAC   Post-op Pain Management:    Induction:   Airway Management Planned: Natural Airway, Nasal Cannula and LMA  Additional Equipment:   Intra-op Plan:   Post-operative Plan:   Informed Consent: I have reviewed the patients History and Physical, chart, labs and discussed the procedure including the risks, benefits and alternatives for the proposed anesthesia with the patient or authorized representative who has indicated his/her understanding and acceptance.     Plan Discussed with: CRNA  Anesthesia Plan Comments:         Anesthesia Quick Evaluation

## 2016-10-12 NOTE — Op Note (Signed)
  Procedure Date:  10/12/2016  Pre-operative Diagnosis:  Metastatic rectal cancer  Post-operative Diagnosis:  Metastatic rectal cancer  Procedure:  Left subclavian port-a-cath placement  Surgeon:  Melvyn Neth, MD  Anesthesia:  MAC + 10 ml of 0.5% bupivacaine with 1% lidocaine  Estimated Blood Loss:  5 ml  Specimens:  None  Complications:  None  Indications for Procedure:  This is a 65 y.o. male who requires a port-a-cath for chemotherapy.  The risks of bleeding, infection, injury to surrounding structures, thrombosis, nonfunction, pneumothorax, hemothorax, and need for further procedures were discussed with the patient and was willing to proceed.  Description of Procedure: The patient was correctly identified in the preoperative area and brought into the operating room.  The patient was placed supine with VTE prophylaxis in place.  Appropriate time-outs were performed.  Anesthesia was induced.  Appropriate antibiotics were infused.  The left chest and neck were prepped and draped in usual sterile fashion. The patient was placed in Trendelenburg position and local anesthetic was infiltrated into the skin and subcutaneous tissues in the anterior chest wall. The large bore needle was placed into the subclavian vein without difficulty and then the Seldinger wire was advanced. Fluoroscopy was utilized to confirm that the Seldinger wire was in the superior vena cava.  The introducer dilator was placed over the Seldinger wire the wire was removed. The previously flushed catheter was placed into the introducer dilator and the peel-away sheath was removed. The catheter length was confirmed and trimmed utilizing fluoroscopy for proper positioning. An incision was made and a port pocket developed with blunt and electrocautery dissection.  The catheter was then attached to the previously flushed port. The port was placed into the pocket. The port was held in with 3-0 Prolenes and flushed for  function and heparin locked.  The wound was closed with interrupted 3-0 Vicryl followed by 4-0 subcuticular Monocryl sutures and sealed with DermaBond.  The patient was emerged from anesthesia and brought to the recovery room for further management.  A chest x-ray was ordered.  The patient tolerated the procedure well and all counts were correct at the end of the case.   Melvyn Neth, MD

## 2016-10-12 NOTE — Anesthesia Postprocedure Evaluation (Signed)
Anesthesia Post Note  Patient: [place] [employer]development [employer]  Procedure(s) Performed: Procedure(s) (LRB): INSERTION PORT-A-CATH (Left)  Patient location during evaluation: PACU Anesthesia Type: General Level of consciousness: awake Pain management: pain level controlled Vital Signs Assessment: post-procedure vital signs reviewed and stable Respiratory status: spontaneous breathing Cardiovascular status: stable Anesthetic complications: no    Last Vitals:  Vitals:   10/12/16 1414 10/12/16 1429  BP: 123/70 119/72  Pulse: 89 68  Resp: 18 15  Temp:  36.7 C    Last Pain:  Vitals:   10/12/16 1359  TempSrc: Tympanic  PainSc:                  VAN STAVEREN,Jakyron Fabro

## 2016-10-12 NOTE — Interval H&P Note (Signed)
History and Physical Interval Note:  10/12/2016 12:42 PM  Nathan Atkinson  has presented today for surgery, with the diagnosis of Rectal Cancer  The various methods of treatment have been discussed with the patient and family. After consideration of risks, benefits and other options for treatment, the patient has consented to  Procedure(s): INSERTION PORT-A-CATH (N/A) as a surgical intervention .  The patient's history has been reviewed, patient examined, no change in status, stable for surgery.  I have reviewed the patient's chart and labs.  Questions were answered to the patient's satisfaction.     [name]

## 2016-10-12 NOTE — H&P (View-Only) (Signed)
10/11/2016  Reason for Visit:  Metastatic rectal cancer  History of Present Illness: Nathan Atkinson is a 65 y.o. male who was recently diagnosed with metastatic rectal cancer.  Colonoscopy was attempted on 10/26 which was not able to traverse the mass but biopsy was obtained which showed moderately differentiated invasive adenocarcinoma.  CT scan of abdomen and pelvis revealed multiple liver mets as well.  He was referred to Surgery for port-a-cath placement for chemotherapy as well as for possible obstructive symptoms from the rectal mass that may require diversion.  He denies feeling distended or bloated.  Denies any nausea or vomiting.  Reports having multiple small bowel movements per day (from a baseline of 4 per day prior to any issues).  He reports urgency feeling and then having a small amount of stool.  Describes some discomfort associated with this which is relieved after each bowel movement.  However, he denies any significant pain.  Does note some red color to the stool but no frank bleeding.  Looking at his records, has had a 9 lb weight loss over the past year.   Past Medical History: Past Medical History:  Diagnosis Date  . Cataract 08/2015   forming  . Colorectal cancer ([place]) 10/08/2016  . Diabetes mellitus without complication ([place])   . Hyperlipidemia   . Hypertension   . Platelets decreased ([place])      Past Surgical History: Past Surgical History:  Procedure Laterality Date  . CARDIAC CATHETERIZATION  2003  . COLONOSCOPY N/A 10/04/2016   Procedure: COLONOSCOPY;  Surgeon: Lollie Sails, MD;  Location: [facility] ENDOSCOPY;  Service: Endoscopy;  Laterality: N/A;  . ESOPHAGOGASTRODUODENOSCOPY (EGD) WITH PROPOFOL N/A 10/04/2016   Procedure: ESOPHAGOGASTRODUODENOSCOPY (EGD) WITH PROPOFOL;  Surgeon: Lollie Sails, MD;  Location: [facility] ENDOSCOPY;  Service: Endoscopy;  Laterality: N/A;    Home Medications: Prior to Admission medications   Medication Sig Start  Date End Date Taking? Authorizing Provider  atorvastatin (LIPITOR) 40 MG tablet Take 40 mg by mouth daily. Reported on 05/09/2016   Yes Historical Provider, MD  enalapril (VASOTEC) 10 MG tablet Take 10 mg by mouth daily.   Yes Historical Provider, MD  glipiZIDE (GLUCOTROL) 5 MG tablet Take 5 mg by mouth daily after supper.    Yes Historical Provider, MD  metFORMIN (GLUCOPHAGE) 500 MG tablet Take 1,000 mg by mouth 2 (two) times daily. Reported on 08/22/17/2017- taking 2-55m bid Metformin- causing extreme number of bowel movements and abd. Pain Reported on 09/05/16- now taking 2 tabs q am and 1 tab in the pm- diarrhea has decreased.   Yes Historical Provider, MD  omeprazole (PRILOSEC) 20 MG capsule Take 1 capsule (20 mg total) by mouth daily. 10/11/16   JOlean Ree MD  Polyethylene Glycol POWD Take 17 g by mouth daily. 10/11/16   JOlean Ree MD  potassium chloride (K-DUR) 10 MEQ tablet Take 4 tablets (40 mEq total) by mouth daily. 10/11/16   JOlean Ree MD    Allergies: No Known Allergies  Social History:  reports that he quit smoking about 21 years ago. His smoking use included Cigarettes. He quit after 10.00 years of use. He has never used smokeless tobacco. He reports that he drinks alcohol. He reports that he does not use drugs.   Family History: Family History  Problem Relation Age of Onset  . Cancer Brother     not sure what kind    Review of Systems: Review of Systems  Constitutional: Negative for chills and fever.  Eyes:  Negative for blurred vision.  Respiratory: Negative for cough and shortness of breath.   Cardiovascular: Negative for chest pain and leg swelling.  Gastrointestinal: Positive for blood in stool. Negative for abdominal pain (only some discomfort with bowel movements), constipation, diarrhea, heartburn, nausea and vomiting.  Genitourinary: Negative for dysuria and hematuria.  Musculoskeletal: Negative for myalgias.  Skin: Negative for rash.  Neurological:  Negative for dizziness and headaches.  Psychiatric/Behavioral: Negative for depression.    Physical Exam BP 125/74   Pulse 76   Temp 98.3 F (36.8 C) (Oral)   Ht '5\' 3"'  (1.6 m)   Wt 64.4 kg (142 lb)   BMI 25.15 kg/m  CONSTITUTIONAL: No acute distress HEENT:  Normocephalic, atraumatic, extraocular motion intact. NECK: Trachea is midline, and there is no jugular venous distension.  LYMPH NODES:  Lymph nodes in the neck are not enlarged. RESPIRATORY:  Lungs are clear, and breath sounds are equal bilaterally. Normal respiratory effort without pathologic use of accessory muscles. CARDIOVASCULAR: Heart is regular without murmurs, gallops, or rubs. GI: The abdomen is soft, non-distended, non-tender to palpation. No peritoneal signs.  There were no palpable masses. Rectal:  No gross blood per rectum.  Palpable rectal mass at the fingertip level, about 5 cm from anal verge consistent with large rectal mass on CT scan and colonoscopy.  Non-tender on exam. MUSCULOSKELETAL:  Normal muscle strength and tone in all four extremities.  No peripheral edema or cyanosis. SKIN: Skin turgor is normal. There are no pathologic skin lesions.  NEUROLOGIC:  Motor and sensation is grossly normal.  Cranial nerves are grossly intact. PSYCH:  Alert and oriented to person, place and time. Affect is normal.  Laboratory Analysis: Notable for CEA of 8, WBC 7.7, Plt 122, Hct 36.9, tbili 0.3, AST 73, ALT 141, Alk Phos 473, Cr 0.91  Imaging: Abdomen/Pelvis CT scan from 10/18 showing: 1. Innumerable all ill-defined liver lesions compatible with metastatic disease. Pulmonary nodules in the right lower lobe also suggest metastatic involvement. 2. Although a discrete mass lesion is not evident, the rectal wall appears thickened and irregular raising concern for neoplasm. There is perirectal edema/inflammation and some small perirectal lymph nodes. 3. Necrotic lymphadenopathy in the upper abdomen with  para-aortic retroperitoneal lymphadenopathy associated. 4. Small right renal cysts.  Chest CT has been ordered for 11/6.    Colonoscopy 10/26: A fungating and infiltrative partially obstructing large mass was found in the recto-sigmoid colon. The mass was circumferential.  The mass measured nine cm in length. The lesion is friable to even rinsing. The scope was advanced to about 18 cm from the anal verge where a stenosis is seen in the mass.  Assessment and Plan: This is a 65 y.o. male who presents with metastatic rectal cancer.  Per CT scan and colonoscopy, this is a large mass which has protrusion extending down to about 5 cm from anal verge based on my rectal exam.  Based on the patient's description of his symptoms, I believe that the mass is causing a feeling of fullness of the rectum which in turn is causing him urgency to have a bowel movement.  He has discomfort from straining but only a small amount of stool comes at a time because the vault is actually not full of stool but of the mass instead.  He is not distended, not having any nausea, and is having flatus and bowel movements.  At this point, there is no urgency to have a diverting colostomy, and the priority should be to  start his chemotherapy.  He may benefit from radiation oncology consultation in the future as well.  I have discussed with the patient that if his symptoms were to worsen, if he develops any abdominal distention or nausea, or if he stops passing flatus or having bowel movements, that we will need to re-evaluate him for a diverting colostomy.  He agreed to this if need be in the future and will have close follow up with [place] in the clinic.   In the meantime, will plan for left sided port-a-cath placement for tomorrow 10/12/16.  Risks and benefits of the procedure were explained to the patient, including risk of bleeding, infection, injury to surrounding structures, pneumothorax and possible need for chest tube, and he was  willing to proceed.  He will go to pre-op testing today.  Instructions for NPO after midnight and when to come in for surgery were given to the patient.    Face-to-face time spent with the patient and care providers was 60 minutes, with more than 50% of the time spent counseling, educating, and coordinating care of the patient.    Melvyn Neth, [place]

## 2016-10-15 ENCOUNTER — Ambulatory Visit
Admission: RE | Admit: 2016-10-15 | Discharge: 2016-10-15 | Disposition: A | Discharge status: Home / Self Care | Payer: 59 | Source: Ambulatory Visit | Attending: Hematology and Oncology | Admitting: Hematology and Oncology

## 2016-10-15 ENCOUNTER — Encounter: Payer: Self-pay | Admitting: Surgery

## 2016-10-15 DIAGNOSIS — R918 Other nonspecific abnormal finding of lung field: Secondary | ICD-10-CM | POA: Insufficient documentation

## 2016-10-15 DIAGNOSIS — C19 Malignant neoplasm of rectosigmoid junction: Secondary | ICD-10-CM | POA: Insufficient documentation

## 2016-10-15 DIAGNOSIS — C189 Malignant neoplasm of colon, unspecified: Secondary | ICD-10-CM | POA: Diagnosis not present

## 2016-10-19 ENCOUNTER — Telehealth: Payer: Self-pay

## 2016-10-19 ENCOUNTER — Other Ambulatory Visit: Payer: Self-pay | Admitting: Hematology and Oncology

## 2016-10-19 ENCOUNTER — Ambulatory Visit (INDEPENDENT_AMBULATORY_CARE_PROVIDER_SITE_OTHER): Payer: 59 | Admitting: Surgery

## 2016-10-19 ENCOUNTER — Encounter: Payer: Self-pay | Admitting: Surgery

## 2016-10-19 VITALS — BP 134/72 | HR 80 | Temp 98.4°F | Wt 138.6 lb

## 2016-10-19 DIAGNOSIS — Z09 Encounter for follow-up examination after completed treatment for conditions other than malignant neoplasm: Secondary | ICD-10-CM

## 2016-10-19 NOTE — Patient Instructions (Signed)
Bevacizumab injection What is this medicine? BEVACIZUMAB (be va SIZ yoo mab) is a monoclonal antibody. It is used to treat cervical cancer, colorectal cancer, glioblastoma multiforme, non-small cell lung cancer (NSCLC), ovarian cancer, and renal cell cancer. This medicine may be used for other purposes; ask your health care provider or pharmacist if you have questions. What should I tell my health care provider before I take this medicine? They need to know if you have any of these conditions: -blood clots -heart disease, including heart failure, heart attack, or chest pain (angina) -high blood pressure -infection (especially a virus infection such as chickenpox, cold sores, or herpes) -kidney disease -lung disease -prior chemotherapy with doxorubicin, daunorubicin, epirubicin, or other anthracycline type chemotherapy agents -recent or ongoing radiation therapy -recent surgery -stroke -an unusual or allergic reaction to bevacizumab, hamster proteins, mouse proteins, other medicines, foods, dyes, or preservatives -pregnant or trying to get pregnant -breast-feeding How should I use this medicine? This medicine is for infusion into a vein. It is given by a health care professional in a hospital or clinic setting. Talk to your pediatrician regarding the use of this medicine in children. Special care may be needed. Overdosage: If you think you have taken too much of this medicine contact a poison control center or emergency room at once. NOTE: This medicine is only for you. Do not share this medicine with others. What if I miss a dose? It is important not to miss your dose. Call your doctor or health care professional if you are unable to keep an appointment. What may interact with this medicine? Interactions are not expected. This list may not describe all possible interactions. Give your health care provider a list of all the medicines, herbs, non-prescription drugs, or dietary supplements  you use. Also tell them if you smoke, drink alcohol, or use illegal drugs. Some items may interact with your medicine. What should I watch for while using this medicine? Your condition will be monitored carefully while you are receiving this medicine. You will need important blood work and urine testing done while you are taking this medicine. During your treatment, let your health care professional know if you have any unusual symptoms, such as difficulty breathing. This medicine may rarely cause 'gastrointestinal perforation' (holes in the stomach, intestines or colon), a serious side effect requiring surgery to repair. This medicine should be started at least 28 days following major surgery and the site of the surgery should be totally healed. Check with your doctor before scheduling dental work or surgery while you are receiving this treatment. Talk to your doctor if you have recently had surgery or if you have a wound that has not healed. Do not become pregnant while taking this medicine or for 6 months after stopping it. Women should inform their doctor if they wish to become pregnant or think they might be pregnant. There is a potential for serious side effects to an unborn child. Talk to your health care professional or pharmacist for more information. Do not breast-feed an infant while taking this medicine. This medicine has caused ovarian failure in some women. This medicine may interfere with the ability to have a child. You should talk to your doctor or health care professional if you are concerned about your fertility. What side effects may I notice from receiving this medicine? Side effects that you should report to your doctor or health care professional as soon as possible: -allergic reactions like skin rash, itching or hives, swelling of the face,  lips, or tongue -signs of infection - fever or chills, cough, sore throat, pain or trouble passing urine -signs of decreased platelets or  bleeding - bruising, pinpoint red spots on the skin, black, tarry stools, nosebleeds, blood in the urine -breathing problems -changes in vision -chest pain -confusion -jaw pain, especially after dental work -mouth sores -seizures -severe abdominal pain -severe headache -sudden numbness or weakness of the face, arm or leg -swelling of legs or ankles -symptoms of a stroke: change in mental awareness, inability to talk or move one side of the body (especially in patients with lung cancer) -trouble passing urine or change in the amount of urine -trouble speaking or understanding -trouble walking, dizziness, loss of balance or coordination Side effects that usually do not require medical attention (report to your doctor or health care professional if they continue or are bothersome): -constipation -diarrhea -dry skin -headache -loss of appetite -nausea, vomiting This list may not describe all possible side effects. Call your doctor for medical advice about side effects. You may report side effects to FDA at 1-800-FDA-1088. Where should I keep my medicine? This drug is given in a hospital or clinic and will not be stored at home. NOTE: This sheet is a summary. It may not cover all possible information. If you have questions about this medicine, talk to your doctor, pharmacist, or health care provider.    2016, [employer]. (2015-01-25 16:58:44) Cetuximab injection What is this medicine? CETUXIMAB (se TUX i mab) is a monoclonal antibody. It is used to treat colorectal cancer and head and neck cancer. This medicine may be used for other purposes; ask your health care provider or pharmacist if you have questions. What should I tell my health care provider before I take this medicine? They need to know if you have any of these conditions: -heart disease -history of irregular heartbeat -history of low levels of calcium, magnesium, or potassium in the blood -lung or breathing  disease, like asthma -an unusual or allergic reaction to cetuximab, other medicines, foods, dyes, or preservatives -pregnant or trying to get pregnant -breast-feeding How should I use this medicine? This drug is given as an infusion into a vein. It is administered in a hospital or clinic by a specially trained health care professional. Talk to your pediatrician regarding the use of this medicine in children. Special care may be needed. Overdosage: If you think you have taken too much of this medicine contact a poison control center or emergency room at once. NOTE: This medicine is only for you. Do not share this medicine with others. What if I miss a dose? It is important not to miss your dose. Call your doctor or health care professional if you are unable to keep an appointment. What may interact with this medicine? Interactions are not expected. This list may not describe all possible interactions. Give your health care provider a list of all the medicines, herbs, non-prescription drugs, or dietary supplements you use. Also tell them if you smoke, drink alcohol, or use illegal drugs. Some items may interact with your medicine. What should I watch for while using this medicine? Visit your doctor or health care professional for regular checks on your progress. This drug may make you feel generally unwell. This is not uncommon, as chemotherapy can affect healthy cells as well as cancer cells. Report any side effects. Continue your course of treatment even though you feel ill unless your doctor tells you to stop. This medicine can make you more sensitive  to the sun. Keep out of the sun while taking this medicine and for 2 months after the last dose. If you cannot avoid being in the sun, wear protective clothing and use sunscreen. Do not use sun lamps or tanning beds/booths. You may need blood work done while you are taking this medicine. In some cases, you may be given additional medicines to help  with side effects. Follow all directions for their use. Call your doctor or health care professional for advice if you get a fever, chills or sore throat, or other symptoms of a cold or flu. Do not treat yourself. This drug decreases your body's ability to fight infections. Try to avoid being around people who are sick. Avoid taking products that contain aspirin, acetaminophen, ibuprofen, naproxen, or ketoprofen unless instructed by your doctor. These medicines may hide a fever. Do not become pregnant while taking this medicine. Women should inform their doctor if they wish to become pregnant or think they might be pregnant. There is a potential for serious side effects to an unborn child. Use adequate birth control methods. Avoid pregnancy for at least 6 months after your last dose. Talk to your health care professional or pharmacist for more information. Do not breast-feed an infant while taking this medicine or during the 2 months after your last dose. What side effects may I notice from receiving this medicine? Side effects that you should report to your doctor or health care professional as soon as possible: -allergic reactions like skin rash, itching or hives, swelling of the face, lips, or tongue -breathing problems -changes in vision -fast, irregular heartbeat -feeling faint or lightheaded, falls -fever, chills -mouth sores -redness, blistering, peeling or loosening of the skin, including inside the mouth -trouble passing urine or change in the amount of urine -unusually weak or tired Side effects that usually do not require medical attention (report to your doctor or health care professional if they continue or are bothersome): -changes in skin like acne, cracks, skin dryness -constipation -diarrhea -headache -nail changes -nausea, vomiting -stomach upset -weight loss This list may not describe all possible side effects. Call your doctor for medical advice about side effects. You  may report side effects to FDA at 1-800-FDA-1088. Where should I keep my medicine? This drug is given in a hospital or clinic and will not be stored at home. NOTE: This sheet is a summary. It may not cover all possible information. If you have questions about this medicine, talk to your doctor, pharmacist, or health care provider.    2016, [employer]. (2015-02-02 22:27:08) Oxaliplatin Injection What is this medicine? OXALIPLATIN (ox AL i PLA tin) is a chemotherapy drug. It targets fast dividing cells, like cancer cells, and causes these cells to die. This medicine is used to treat cancers of the colon and rectum, and many other cancers. This medicine may be used for other purposes; ask your health care provider or pharmacist if you have questions. What should I tell my health care provider before I take this medicine? They need to know if you have any of these conditions: -kidney disease -an unusual or allergic reaction to oxaliplatin, other chemotherapy, other medicines, foods, dyes, or preservatives -pregnant or trying to get pregnant -breast-feeding How should I use this medicine? This drug is given as an infusion into a vein. It is administered in a hospital or clinic by a specially trained health care professional. Talk to your pediatrician regarding the use of this medicine in children. Special care may  be needed. Overdosage: If you think you have taken too much of this medicine contact a poison control center or emergency room at once. NOTE: This medicine is only for you. Do not share this medicine with others. What if I miss a dose? It is important not to miss a dose. Call your doctor or health care professional if you are unable to keep an appointment. What may interact with this medicine? -medicines to increase blood counts like filgrastim, pegfilgrastim, sargramostim -probenecid -some antibiotics like amikacin, gentamicin, neomycin, polymyxin B, streptomycin,  tobramycin -zalcitabine Talk to your doctor or health care professional before taking any of these medicines: -acetaminophen -aspirin -ibuprofen -ketoprofen -naproxen This list may not describe all possible interactions. Give your health care provider a list of all the medicines, herbs, non-prescription drugs, or dietary supplements you use. Also tell them if you smoke, drink alcohol, or use illegal drugs. Some items may interact with your medicine. What should I watch for while using this medicine? Your condition will be monitored carefully while you are receiving this medicine. You will need important blood work done while you are taking this medicine. This medicine can make you more sensitive to cold. Do not drink cold drinks or use ice. Cover exposed skin before coming in contact with cold temperatures or cold objects. When out in cold weather wear warm clothing and cover your mouth and nose to warm the air that goes into your lungs. Tell your doctor if you get sensitive to the cold. This drug may make you feel generally unwell. This is not uncommon, as chemotherapy can affect healthy cells as well as cancer cells. Report any side effects. Continue your course of treatment even though you feel ill unless your doctor tells you to stop. In some cases, you may be given additional medicines to help with side effects. Follow all directions for their use. Call your doctor or health care professional for advice if you get a fever, chills or sore throat, or other symptoms of a cold or flu. Do not treat yourself. This drug decreases your body's ability to fight infections. Try to avoid being around people who are sick. This medicine may increase your risk to bruise or bleed. Call your doctor or health care professional if you notice any unusual bleeding. Be careful brushing and flossing your teeth or using a toothpick because you may get an infection or bleed more easily. If you have any dental work done,  tell your dentist you are receiving this medicine. Avoid taking products that contain aspirin, acetaminophen, ibuprofen, naproxen, or ketoprofen unless instructed by your doctor. These medicines may hide a fever. Do not become pregnant while taking this medicine. Women should inform their doctor if they wish to become pregnant or think they might be pregnant. There is a potential for serious side effects to an unborn child. Talk to your health care professional or pharmacist for more information. Do not breast-feed an infant while taking this medicine. Call your doctor or health care professional if you get diarrhea. Do not treat yourself. What side effects may I notice from receiving this medicine? Side effects that you should report to your doctor or health care professional as soon as possible: -allergic reactions like skin rash, itching or hives, swelling of the face, lips, or tongue -low blood counts - This drug may decrease the number of white blood cells, red blood cells and platelets. You may be at increased risk for infections and bleeding. -signs of infection - fever or  chills, cough, sore throat, pain or difficulty passing urine -signs of decreased platelets or bleeding - bruising, pinpoint red spots on the skin, black, tarry stools, nosebleeds -signs of decreased red blood cells - unusually weak or tired, fainting spells, lightheadedness -breathing problems -chest pain, pressure -cough -diarrhea -jaw tightness -mouth sores -nausea and vomiting -pain, swelling, redness or irritation at the injection site -pain, tingling, numbness in the hands or feet -problems with balance, talking, walking -redness, blistering, peeling or loosening of the skin, including inside the mouth -trouble passing urine or change in the amount of urine Side effects that usually do not require medical attention (report to your doctor or health care professional if they continue or are bothersome): -changes  in vision -constipation -hair loss -loss of appetite -metallic taste in the mouth or changes in taste -stomach pain This list may not describe all possible side effects. Call your doctor for medical advice about side effects. You may report side effects to FDA at 1-800-FDA-1088. Where should I keep my medicine? This drug is given in a hospital or clinic and will not be stored at home. NOTE: This sheet is a summary. It may not cover all possible information. If you have questions about this medicine, talk to your doctor, pharmacist, or health care provider.    2016, [employer]. (2008-06-22 17:22:47) Fluorouracil, 5-FU injection What is this medicine? FLUOROURACIL, 5-FU (flure oh YOOR a sil) is a chemotherapy drug. It slows the growth of cancer cells. This medicine is used to treat many types of cancer like breast cancer, colon or rectal cancer, pancreatic cancer, and stomach cancer. This medicine may be used for other purposes; ask your health care provider or pharmacist if you have questions. What should I tell my health care provider before I take this medicine? They need to know if you have any of these conditions: -blood disorders -dihydropyrimidine dehydrogenase (DPD) deficiency -infection (especially a virus infection such as chickenpox, cold sores, or herpes) -kidney disease -liver disease -malnourished, poor nutrition -recent or ongoing radiation therapy -an unusual or allergic reaction to fluorouracil, other chemotherapy, other medicines, foods, dyes, or preservatives -pregnant or trying to get pregnant -breast-feeding How should I use this medicine? This drug is given as an infusion or injection into a vein. It is administered in a hospital or clinic by a specially trained health care professional. Talk to your pediatrician regarding the use of this medicine in children. Special care may be needed. Overdosage: If you think you have taken too much of this medicine  contact a poison control center or emergency room at once. NOTE: This medicine is only for you. Do not share this medicine with others. What if I miss a dose? It is important not to miss your dose. Call your doctor or health care professional if you are unable to keep an appointment. What may interact with this medicine? -allopurinol -cimetidine -dapsone -digoxin -hydroxyurea -leucovorin -levamisole -medicines for seizures like ethotoin, fosphenytoin, phenytoin -medicines to increase blood counts like filgrastim, pegfilgrastim, sargramostim -medicines that treat or prevent blood clots like warfarin, enoxaparin, and dalteparin -methotrexate -metronidazole -pyrimethamine -some other chemotherapy drugs like busulfan, cisplatin, estramustine, vinblastine -trimethoprim -trimetrexate -vaccines Talk to your doctor or health care professional before taking any of these medicines: -acetaminophen -aspirin -ibuprofen -ketoprofen -naproxen This list may not describe all possible interactions. Give your health care provider a list of all the medicines, herbs, non-prescription drugs, or dietary supplements you use. Also tell them if you smoke, drink alcohol, or use illegal  drugs. Some items may interact with your medicine. What should I watch for while using this medicine? Visit your doctor for checks on your progress. This drug may make you feel generally unwell. This is not uncommon, as chemotherapy can affect healthy cells as well as cancer cells. Report any side effects. Continue your course of treatment even though you feel ill unless your doctor tells you to stop. In some cases, you may be given additional medicines to help with side effects. Follow all directions for their use. Call your doctor or health care professional for advice if you get a fever, chills or sore throat, or other symptoms of a cold or flu. Do not treat yourself. This drug decreases your body's ability to fight  infections. Try to avoid being around people who are sick. This medicine may increase your risk to bruise or bleed. Call your doctor or health care professional if you notice any unusual bleeding. Be careful brushing and flossing your teeth or using a toothpick because you may get an infection or bleed more easily. If you have any dental work done, tell your dentist you are receiving this medicine. Avoid taking products that contain aspirin, acetaminophen, ibuprofen, naproxen, or ketoprofen unless instructed by your doctor. These medicines may hide a fever. Do not become pregnant while taking this medicine. Women should inform their doctor if they wish to become pregnant or think they might be pregnant. There is a potential for serious side effects to an unborn child. Talk to your health care professional or pharmacist for more information. Do not breast-feed an infant while taking this medicine. Men should inform their doctor if they wish to father a child. This medicine may lower sperm counts. Do not treat diarrhea with over the counter products. Contact your doctor if you have diarrhea that lasts more than 2 days or if it is severe and watery. This medicine can make you more sensitive to the sun. Keep out of the sun. If you cannot avoid being in the sun, wear protective clothing and use sunscreen. Do not use sun lamps or tanning beds/booths. What side effects may I notice from receiving this medicine? Side effects that you should report to your doctor or health care professional as soon as possible: -allergic reactions like skin rash, itching or hives, swelling of the face, lips, or tongue -low blood counts - this medicine may decrease the number of white blood cells, red blood cells and platelets. You may be at increased risk for infections and bleeding. -signs of infection - fever or chills, cough, sore throat, pain or difficulty passing urine -signs of decreased platelets or bleeding - bruising,  pinpoint red spots on the skin, black, tarry stools, blood in the urine -signs of decreased red blood cells - unusually weak or tired, fainting spells, lightheadedness -breathing problems -changes in vision -chest pain -mouth sores -nausea and vomiting -pain, swelling, redness at site where injected -pain, tingling, numbness in the hands or feet -redness, swelling, or sores on hands or feet -stomach pain -unusual bleeding Side effects that usually do not require medical attention (report to your doctor or health care professional if they continue or are bothersome): -changes in finger or toe nails -diarrhea -dry or itchy skin -hair loss -headache -loss of appetite -sensitivity of eyes to the light -stomach upset -unusually teary eyes This list may not describe all possible side effects. Call your doctor for medical advice about side effects. You may report side effects to FDA at 1-800-FDA-1088. Where  should I keep my medicine? This drug is given in a hospital or clinic and will not be stored at home. NOTE: This sheet is a summary. It may not cover all possible information. If you have questions about this medicine, talk to your doctor, pharmacist, or health care provider.    2016, [employer]. (2008-03-31 13:53:16) Leucovorin injection What is this medicine? LEUCOVORIN (loo koe VOR in) is used to prevent or treat the harmful effects of some medicines. This medicine is used to treat anemia caused by a low amount of folic acid in the body. It is also used with 5-fluorouracil (5-FU) to treat colon cancer. This medicine may be used for other purposes; ask your health care provider or pharmacist if you have questions. What should I tell my health care provider before I take this medicine? They need to know if you have any of these conditions: -anemia from low levels of vitamin B-12 in the blood -an unusual or allergic reaction to leucovorin, folic acid, other medicines,  foods, dyes, or preservatives -pregnant or trying to get pregnant -breast-feeding How should I use this medicine? This medicine is for injection into a muscle or into a vein. It is given by a health care professional in a hospital or clinic setting. Talk to your pediatrician regarding the use of this medicine in children. Special care may be needed. Overdosage: If you think you have taken too much of this medicine contact a poison control center or emergency room at once. NOTE: This medicine is only for you. Do not share this medicine with others. What if I miss a dose? This does not apply. What may interact with this medicine? -capecitabine -fluorouracil -phenobarbital -phenytoin -primidone -trimethoprim-sulfamethoxazole This list may not describe all possible interactions. Give your health care provider a list of all the medicines, herbs, non-prescription drugs, or dietary supplements you use. Also tell them if you smoke, drink alcohol, or use illegal drugs. Some items may interact with your medicine. What should I watch for while using this medicine? Your condition will be monitored carefully while you are receiving this medicine. This medicine may increase the side effects of 5-fluorouracil, 5-FU. Tell your doctor or health care professional if you have diarrhea or mouth sores that do not get better or that get worse. What side effects may I notice from receiving this medicine? Side effects that you should report to your doctor or health care professional as soon as possible: -allergic reactions like skin rash, itching or hives, swelling of the face, lips, or tongue -breathing problems -fever, infection -mouth sores -unusual bleeding or bruising -unusually weak or tired Side effects that usually do not require medical attention (report to your doctor or health care professional if they continue or are bothersome): -constipation or diarrhea -loss of appetite -nausea, vomiting This  list may not describe all possible side effects. Call your doctor for medical advice about side effects. You may report side effects to FDA at 1-800-FDA-1088. Where should I keep my medicine? This drug is given in a hospital or clinic and will not be stored at home. NOTE: This sheet is a summary. It may not cover all possible information. If you have questions about this medicine, talk to your doctor, pharmacist, or health care provider.    2016, Elsevier/Gold Standard. (2008-06-01 16:50:29) Bevacizumab injection Qu es este medicamento? El BEVACIZUMAB es un anticuerpo monoclonal. [place] para tratar el cncer cervical, el cncer colorrectal, el glioblastoma multiforme, el cncer de pulmn de clulas no pequeas,  el cncer de ovarios y el cncer de clulas renales. Este medicamento puede ser utilizado para otros usos; si tiene alguna pregunta consulte con su proveedor de atencin mdica o con su farmacutico. Qu le debo informar a mi profesional de la salud antes de tomar este medicamento? Necesita saber si usted presenta alguno de los siguientes problemas o situaciones: -cogulos sanguneos -enfermedad cardiaca, incluida la insuficiencia cardiaca, infarto de corazn o dolor de pecho (angina) -alta presin sangunea -infeccin (especialmente infecciones virales, como varicela o herpes) -enfermedad renal -enfermedad pulmonar -quimioterapia previa con doxorrubicina, daunorrubicina, epirrubicina o cualquier otro tipo de agentes quimioteraputicos antraciclnicos -radioterapia reciente o en curso -ciruga reciente -derrame cerebral -una reaccin alrgica o inusual al bevacizumab, a las protenas de Production designer, theatre/television/film, a las protenas de ratn, a otros medicamentos, alimentos, Scientist, water quality o conservantes -si est embarazada o buscando quedar embarazada -si est amamantando a un beb Cmo debo Insurance account manager medicamento? Este medicamento se administra mediante infusin por va intravenosa. Lo administra un  profesional de Technical sales engineer en un hospital o en un entorno clnico. Hable con su pediatra para informarse acerca del uso de este medicamento en nios. Puede requerir atencin especial. Sobredosis: Pngase en contacto inmediatamente con un centro toxicolgico o una sala de urgencia si usted cree que haya tomado demasiado medicamento. ATENCIN: [employer] es solo para usted. No comparta este medicamento con nadie. Qu sucede si me olvido de una dosis? Es importante no olvidar ninguna dosis. Informe a su mdico o a su profesional de la salud si no puede asistir a Photographer. Qu puede interactuar con este medicamento? No se esperan interacciones. Puede ser que esta lista no menciona todas las posibles interacciones. Informe a su profesional de KB Home	[place] de [employer] productos a base de hierbas, medicamentos de [place] o suplementos nutritivos que est tomando. Si usted fuma, consume bebidas alcohlicas o si utiliza drogas ilegales, indqueselo tambin a su profesional de KB Home	[place]. Algunas sustancias pueden interactuar con su medicamento. A qu debo estar atento al usar Coca-Cola? Se supervisar su condicin atentamente mientras reciba este medicamento. Tendr que hacerse anlisis de sangre y [place] peridicos mientras reciba este medicamento. Si durante el tratamiento usted experimenta sntomas inusuales tales como dificultad para respirar, informe a su profesional de KB Home	[place]. [address] [place], pero casi [place], [place] 'perforacin gastrointestinal' (agujeros en el [place], intestinales o colon), un efecto secundario grave que necesita ciruga para reparar. No debe comenzar este medicamento hasta que hayan transcurrido al menos [address] desde [place] mayor y la herida de dicha ciruga debe haber cicatrizado completamente. Antes de someterse a una operacin o a tratamientos dentales, informe a su mdico que est recibiendo [employer]. Informe a su mdico si se ha sometido a  [place] recientemente o si tiene alguna herida que an no haya cicatrizado. No se debe quedar embarazada mientras reciba este medicamento o durante 6 meses despus de dejar de usarlo. Las mujeres deben informar a su mdico si estn buscando quedar embarazadas o si creen que estn embarazadas. Existe la posibilidad de efectos secundarios graves a un beb sin nacer. Para ms informacin hable con su profesional de la salud o su farmacutico. No debe amamantar a un beb mientras est tomando este medicamento. Este medicamento ha causado insuficiencia ovrica en [employer]. Este medicamento puede interferir con la capacidad de tener un hijo. Usted debe consultar con su mdico o profesional de la salud si le preocupa su fertilidad. Qu efectos secundarios puedo tener al [employer] este medicamento?  Efectos secundarios que debe informar a su mdico o a Barrister's clerk de la salud tan pronto como sea posible: -Chief of Staff como erupcin cutnea, picazn o urticarias, hinchazn de la cara, labios o lengua -signos de infeccin - fiebre o escalofros, tos, dolor de garganta, dolor o dificultad para orinar -signos de reduccin de plaquetas o sangrado - magulladuras, puntos rojos en la piel, heces de color oscuro o con aspecto alquitranado, sangrado por la [place], sangre en la orina -problemas respiratorios -cambios en la visin -dolor en el pecho -confusin -dolor de mandbula, especialmente despus de tratamiento dental -llagas en la boca -convulsiones -dolor abdominal severo -dolor de cabeza severo -entumecimiento o debilidad repentino de la cara, brazo o pierna -hinchazn de tobillos o piernas -sntomas de derrame cerebral: cambios en la conciencia mental, incapacidad para hablar o mover un lado del cuerpo (especialmente en pacientes con cncer de pulmn) -dificultad para orinar o cambios en el volumen de orina -problemas para hablar o entender -problemas al caminar, mareos, prdida de  equilibrio o coordinacin Efectos secundarios que, por lo general, no requieren Geophysical data processor (debe informarlos a su mdico o a Barrister's clerk de la salud si persisten o si son molestos): -estreimiento -diarrea -piel seca -dolor de cabeza -prdida del apetito -nuseas, vmito Puede ser que esta lista no menciona todos los posibles efectos secundarios. Comunquese a su mdico por asesoramiento mdico [employer]. Usted puede informar los efectos secundarios a la FDA por telfono al 1-800-FDA-1088. Dnde debo guardar mi medicina? Este medicamento se administra en hospitales o clnicas y no necesitar guardarlo en su domicilio. ATENCIN: Este folleto es un resumen. Puede ser que no cubra toda la posible informacin. Si usted tiene preguntas acerca de esta medicina, consulte con su mdico, su farmacutico o su profesional de Technical sales engineer.    2016, Elsevier/Gold Standard. (2015-04-08 00:00:00) Cetuximab injection Qu es este medicamento? El CETUXIMAB es un agente quimioteraputico. Este medicamento acta sobre una protena especfica que se encuentra en la clulas cancerosas y detiene el crecimiento de las clulas. Se utiliza en el tratamiento del Surveyor, minerals, y el cncer de cabeza y cuello. Este medicamento puede ser utilizado para otros usos; si tiene alguna pregunta consulte con su proveedor de atencin mdica o con su farmacutico. Qu le debo informar a mi profesional de la salud antes de tomar este medicamento? Necesita saber si usted presenta alguno de los siguientes problemas o situaciones: -enfermedad cardiaca -antecedentes de pulso cardiaco irregular -antecedentes de niveles bajos de calcio, magnesio o potasio en la sangre -enfermedad pulmonar o respiratoria, como asma -una reaccin alrgica o inusual al cetuximab, a otros medicamentos, alimentos, colorantes o conservantes -si est embarazada o buscando quedar embarazada -si est amamantando a un beb Cmo  debo utilizar este medicamento? Este medicamento se administra mediante infusin por va intravenosa. Lo administra un profesional de la salud calificado en un hospital o en un entorno clnico. Hable con su pediatra para informarse acerca del uso de este medicamento en nios. Puede requerir atencin especial. Sobredosis: Pngase en contacto inmediatamente con un centro toxicolgico o una sala de urgencia si usted cree que haya tomado demasiado medicamento. ATENCIN: [employer] es solo para usted. No comparta este medicamento con nadie. Qu sucede si me olvido de una dosis? Es importante no olvidar ninguna dosis. Informe a su mdico o a su profesional de la salud si no puede asistir a Photographer. Qu puede interactuar con este medicamento? No se esperan interacciones. Puede ser que esta lista no menciona todas  las posibles interacciones. Informe a su profesional de KB Home	[place] de [employer] productos a base de hierbas, medicamentos de [place] o suplementos nutritivos que est tomando. Si usted fuma, consume bebidas alcohlicas o si utiliza drogas ilegales, indqueselo tambin a su profesional de KB Home	[place]. Algunas sustancias pueden interactuar con su medicamento. A qu debo estar atento al usar Coca-Cola? Visite a su mdico o profesional de la salud para chequear su evolucin peridicamente. Este medicamento puede hacerle sentir un Nurse, mental health. Esto es normal ya que la quimioterapia afecta tanto a las clulas sanas como a las clulas cancerosas. Si presenta alguno de los [employer], infrmelos. Sin embargo, contine con el tratamiento aun si se siente enfermo, a menos que su mdico le indique que lo suspenda. Este medicamento puede aumentar la sensibilidad al sol. Mantngase fuera de la luz solar mientras est tomando este medicamento y por 2 meses despus de la ltima dosis. Si no lo puede evitar, utilice ropa protectora y crema de Photographer. No utilice lmparas solares,  camas solares ni cabinas solares. Puede necesitar realizarse anlisis de sangre mientras recibe [place]. En algunos casos, podr recibir [employer] para ayudarle con los efectos secundarios. [place] para usar. Consulte a su mdico o a su profesional de la salud por asesoramiento si tiene fiebre, escalofros, dolor de garganta o cualquier otro sntoma de resfro o gripe. No se trate usted mismo. Este medicamento puede reducir la capacidad del cuerpo para combatir infecciones. Trate de no acercarse a personas que estn enfermas. Evite tomar productos que contienen aspirina, acetaminofeno, ibuprofeno, naproxeno o quetoprofeno a menos que as lo indique su mdico. Estos medicamentos pueden disimular la fiebre. No se debe quedar embarazada mientras recibe este medicamento. Las mujeres deben informar a su mdico si estn buscando quedar embarazadas o si creen que estn embarazadas. Existe la posibilidad de efectos secundarios graves a un beb sin nacer. Utilice mtodos anticonceptivos efectivos. Evite quedar embarazada por lo menos 6 meses despus de su ltima dosis. Para ms informacin hable con su profesional de la salud o su farmacutico. No debe amamantar a un beb mientras est usando este medicamento o por 2 meses despus de su ltima dosis. Qu efectos secundarios puedo tener al [employer] este medicamento? Efectos secundarios que debe informar a su mdico o a Barrister's clerk de la salud tan pronto como sea posible: -Chief of Staff como erupcin cutnea, picazn o urticarias, hinchazn de la cara, labios o lengua -problemas respiratorios -cambios en la visin -pulso cardiaco rpido, irregular -sensacin de desmayos o mareos, cadas -fiebre, escalofros -llagas en la boca -enrojecimiento, formacin de ampollas, descamacin o distensin de la piel, inclusive dentro de la boca -dificultad para orinar o cambios en el volumen de orina -cansancio o debilidad  inusual Efectos secundarios que, por lo general, no requieren atencin mdica (debe informarlos a su mdico o a su profesional de la salud si persisten o si son molestos): -cambios en el aspecto de la piel como acn, resequedad, agrietamiento de la piel -estreimiento -diarrea -dolor de cabeza -cambios en las uas -nuseas, vmito -Higher education careers adviser -prdida de peso Puede ser que esta lista no menciona todos los posibles efectos secundarios. Comunquese a su mdico por asesoramiento mdico [employer]. Usted puede informar los efectos secundarios a la FDA por telfono al 1-800-FDA-1088. Dnde debo guardar mi medicina? Este medicamento se administra en hospitales o clnicas y no necesitar guardarlo en su domicilio. ATENCIN: Este folleto es un resumen. Puede ser que no [place] toda  la posible informacin. Si usted tiene preguntas acerca de esta medicina, consulte con su mdico, su farmacutico o su profesional de Technical sales engineer.    2016, Elsevier/Gold Standard. (2015-01-18 00:00:00) Oxaliplatin Injection Qu es este medicamento? El OXALIPLATINO es un agente quimioteraputico. Este medicamento acta sobre las clulas que se dividen rpidamente, como las clulas cancerosas, y finalmente provoca la muerte de estas clulas. Se utiliza en el tratamiento del cncer de colon y recto y 36 tipos de cncer. Este medicamento puede ser utilizado para otros usos; si tiene alguna pregunta consulte con su proveedor de atencin mdica o con su farmacutico. Qu le debo informar a mi profesional de la salud antes de tomar este medicamento? Necesita saber si usted presenta alguno de los siguientes problemas o situaciones: -enfermedad renal -una reaccin alrgica o inusual al oxaliplatino, a otros agentes quimioteraputicos, a otros medicamentos, alimentos, colorantes o conservantes -si est embarazada o buscando quedar embarazada -si est amamantando a un beb Cmo debo utilizar este  medicamento? Este medicamento se administra mediante infusin por va intravenosa. Lo administra un profesional de la salud calificado en un hospital o en un entorno clnico. Hable con su pediatra para informarse acerca del uso de este medicamento en nios. Puede requerir atencin especial. Sobredosis: Pngase en contacto inmediatamente con un centro toxicolgico o una sala de urgencia si usted cree que haya tomado demasiado medicamento. ATENCIN: [employer] es solo para usted. No comparta este medicamento con nadie. Qu sucede si me olvido de una dosis? Es importante no olvidar ninguna dosis. Informe a su mdico o a su profesional de la salud si no puede asistir a Photographer. Qu puede interactuar con este medicamento? -medicamentos para incrementar los conteos sanguneos, tales como filgrastim, pegfilgrastim, sargramostim -probenecid -ciertos antibiticos, tales como amicacina, gentamicina, neomicina, polimixina B, estreptomicina, tobramicina -zalcitabina Consulte a su mdico o a su profesional de la salud antes de tomar cualquiera de los siguientes medicamentos: -acetaminofeno -aspirina -ibuprofeno -quetoprofeno -naproxeno Puede ser que esta lista no menciona todas las posibles interacciones. Informe a su profesional de KB Home	[place] de [employer] productos a base de hierbas, medicamentos de [place] o suplementos nutritivos que est tomando. Si usted fuma, consume bebidas alcohlicas o si utiliza drogas ilegales, indqueselo tambin a su profesional de KB Home	[place]. Algunas sustancias pueden interactuar con su medicamento. A qu debo estar atento al usar Coca-Cola? Se supervisar su condicin atentamente mientras reciba este medicamento. Tendr que hacerse anlisis de sangre peridicos mientras reciba este medicamento. Este medicamento puede aumentar su sensibilidad al fro. No tomar bebidas fras o usar hielo. Cubra la piel expuesta antes de estar en contacto con temperaturas fras u  objetos fros. Mientras se encuentra afuera cuando haga fro use ropa [place] y [place] su boca y su nariz para calentar el aire que entra en sus pulmones. Informe a su mdico si experimenta sensibilidad al fro. Este medicamento puede hacerle sentir un Nurse, mental health. Esto es normal ya que la quimioterapia afecta tanto a las clulas sanas como a las clulas cancerosas. Si presenta alguno de los [employer], infrmelos. Sin embargo, contine con el tratamiento aun si se siente enfermo, a menos que su mdico le indique que lo suspenda. En algunos casos, podr recibir [employer] para ayudarle con los efectos secundarios. Siga las instrucciones para usarlos. Consulte a su mdico o a su profesional de la salud por asesoramiento si tiene fiebre, escalofros, dolor de garganta o cualquier otro sntoma de resfro o gripe. No se trate usted mismo. Este [employer]  reducir la capacidad del cuerpo para combatir infecciones. Trate de no acercarse a personas que estn enfermas. [employer] puede aumentar el riesgo de magulladuras o sangrado. Consulte a su mdico o a su profesional de la salud si observa sangrados inusuales. Proceda con cuidado al cepillar sus dientes, usar hilo dental o Risk manager palillos para los dientes, ya que puede contraer una infeccin o Therapist, art con mayor facilidad. Si se somete a algn tratamiento dental, informe a su dentista que est [employer]. Evite tomar productos que contienen aspirina, acetaminofeno, ibuprofeno, naproxeno o quetoprofeno a menos que as lo indique su mdico. Estos productos pueden disimular la fiebre. No se debe quedar embarazada mientras reciba este medicamento. Las mujeres deben informar a su mdico si estn buscando quedar embarazadas o si creen que estn embarazadas. Existe la posibilidad de efectos secundarios graves a un beb sin nacer. Para ms informacin hable con su profesional de la salud o su farmacutico. No debe  Economist a un beb mientras reciba este medicamento. Si tiene diarrea, llame a su mdico o a su profesional de KB Home	[place]. No se trate usted mismo. Qu efectos secundarios puedo tener al [employer] este medicamento? Efectos secundarios que debe informar a su mdico o a Barrister's clerk de la salud tan pronto como sea posible: -reacciones alrgicas como erupcin cutnea, picazn o urticarias, hinchazn de la cara, labios o lengua -conteos sanguneos bajos - este medicamento puede reducir la cantidad de glbulos blancos, glbulos rojos y plaquetas. Su riesgo de infeccin y [place]. -signos de infeccin - fiebre o escalofros, tos, dolor de garganta, Social research officer, government o dificultad para orinar -signos de reduccin de plaquetas o sangrado - magulladuras, puntos rojos en la piel, heces de color oscuro o con aspecto alquitranado, sangrado por la nariz -signos de reduccin de glbulos rojos - cansancio o debilidad inusual, desmayos, sensacin de mareo -problemas respiratorios -dolor u opresin en el pecho -tos -diarrea -tensin en la mandbula -llagas en la boca -nuseas, vmito -dolor, hinchazn, enrojecimiento o irritacin en el lugar de la inyeccin -dolor, hormigueo, entumecimiento de manos o pies -problemas de coordinacin, del habla, al caminar -enrojecimiento, formacin de ampollas, descamacin o distensin de la piel, inclusive dentro de la boca -dificultad para orinar o cambios en el volumen de orina Efectos secundarios que, por lo general, no requieren atencin mdica (debe informarlos a su mdico o a su profesional de la salud si persisten o si son molestos): -cambios en la visin -estreimiento -cada del cabello -prdida del apetito -sabor metlico en la boca o cambios en el sentido del gusto -Administrator Puede ser que esta lista no menciona todos los posibles efectos secundarios. Comunquese a su mdico por asesoramiento mdico [employer] Usted puede  informar los efectos secundarios a la FDA por telfono al 1-800-FDA-1088. Dnde debo guardar mi medicina? Este medicamento se administra en hospitales o clnicas y no necesitar guardarlo en su domicilio. ATENCIN: Este folleto es un resumen. Puede ser que no cubra toda la posible informacin. Si usted tiene preguntas acerca de esta medicina, consulte con su mdico, su farmacutico o su profesional de Technical sales engineer.    2016, Elsevier/Gold Standard. (2015-01-18 00:00:00) Fluorouracil, 5-FU injection Qu es este medicamento? El [place], 5-FU es un agente quimioteraputico. Este medicamento reduce el crecimiento de las clulas cancerosas. Se utiliza en el tratamiento de muchos tipos de cncer, incluyendo el cncer de mama, de colon y recto, pancretico y de Paramedic. Este medicamento puede ser utilizado para otros usos; si tiene [place] pregunta  consulte con su proveedor de atencin mdica o con su farmacutico. Qu le debo informar a mi profesional de la salud antes de tomar este medicamento? Necesita saber si usted presenta alguno de los siguientes problemas o situaciones: -trastornos sanguneos -deficiencia de la dihidropirimidina deshidrogenasa (DPD) -infeccin (especialmente infecciones virales, como varicela o herpes) -enfermedad renal -enfermedad heptica -desnutrido, malnutricin -radioterapia reciente o continuada -una reaccin alrgica o inusual al fluorouracilo, a otros agentes quimioteraputicos, otros medicamentos, alimentos, colorantes o conservantes -si est embarazada o buscando quedar embarazada -si est amamantando a un beb Cmo debo utilizar este medicamento? Este medicamento se administra mediante inyeccin o infusin por va intravenosa. Lo administra un profesional de la salud calificado en un hospital o en un entorno clnico. Hable con su pediatra para informarse acerca del uso de este medicamento en nios. Puede requerir atencin especial. Sobredosis: Pngase en  contacto inmediatamente con un centro toxicolgico o una sala de urgencia si usted cree que haya tomado demasiado medicamento. ATENCIN: [employer] es solo para usted. No comparta este medicamento con nadie. Qu sucede si me olvido de una dosis? Es importante no olvidar ninguna dosis. Informe a su mdico o a su profesional de la salud si no puede asistir a Photographer. Qu puede interactuar con este medicamento? -alopurinol -cimetidina -dapsona -digoxina -hidroxiurea -leucovorina -levamisol -medicamentos para convulsiones, tales como etotona, fosfenitona, fenitona -medicamentos para incrementar los conteos sanguneos, tales como filgrastim, pegfilgrastim, sargramostim -medicamentos que tratan o previenen cogulos sanguneos, tales como warfarina, enoxaparina y dalteparina -metotrexato -metronidazol -pirimetamina -otros agentes quimioteraputicos, tales como busulfn, cisplatino, estramustina, vinblastina -trimetoprima -trimetrexato -vacunas Consulte a su mdico o a su profesional de la salud antes de tomar cualquiera de los siguientes medicamentos: -acetaminofeno -aspirina -ibuprofeno -quetoprofeno -naproxeno Puede ser que esta lista no menciona todas las posibles interacciones. Informe a su profesional de KB Home	[place] de [employer] productos a base de hierbas, medicamentos de [place] o suplementos nutritivos que est tomando. Si usted fuma, consume bebidas alcohlicas o si utiliza drogas ilegales, indqueselo tambin a su profesional de KB Home	[place]. Algunas sustancias pueden interactuar con su medicamento. A qu debo estar atento al usar Coca[employer]Cola? Visite a su mdico para chequear su evolucin peridicamente. Este medicamento puede hacerle sentir un Nurse, mental health. Esto es normal ya que la quimioterapia afecta tanto a las clulas sanas como a las clulas cancerosas. Si presenta alguno de los [employer], infrmelos. Sin embargo, contine con el tratamiento aun si  se siente enfermo, a menos que su mdico le indique que lo suspenda. En algunos casos, podr recibir [employer] para ayudarle con los efectos secundarios. Siga las instrucciones para usarlos. Consulte a su mdico o a su profesional de la salud por asesoramiento si tiene fiebre, escalofros, dolor de garganta o cualquier otro sntoma de resfro o gripe. No se trate usted mismo. Este medicamento puede reducir la capacidad del cuerpo para combatir infecciones. Trate de no acercarse a personas que estn enfermas. [employer] puede aumentar el riesgo de magulladuras o sangrado. Consulte a su mdico o a su profesional de la salud si observa sangrados inusuales. Proceda con cuidado al cepillar sus dientes, usar hilo dental o Risk manager palillos para los dientes, ya que puede contraer una infeccin o Therapist, art con mayor facilidad. Si se somete a algn tratamiento dental, informe a su dentista que est [employer]. Evite tomar productos que contienen aspirina, acetaminofeno, ibuprofeno, naproxeno o quetoprofeno a menos que as lo indique su mdico. Estos productos pueden disimular la fiebre. No se debe quedar  embarazada mientras recibe Coca[employer]Cola. Las mujeres deben informar a su mdico si estn buscando quedar embarazadas o si creen que estn embarazadas. Existe la posibilidad de efectos secundarios graves a un beb sin nacer. Para ms informacin hable con su profesional de la salud o su farmacutico. No debe Economist a un beb mientras est usando este medicamento. Los hombres deben informar a su mdico si quieren tener nios. Este medicamento puede reducir el conteo de esperma. No trate la diarrea con productos de [employer]. Comunquese con su mdico si tiene diarrea que dura ms de 2 das o si es severa y [place]. Este medicamento puede aumentar la sensibilidad al sol. Mantngase fuera de Administrator. Si no lo puede evitar, utilice ropa protectora y crema de Photographer.  No utilice lmparas solares, camas solares ni cabinas solares. Qu efectos secundarios puedo tener al [employer] este medicamento? Efectos secundarios que debe informar a su mdico o a Barrister's clerk de la salud tan pronto como sea posible: -reacciones alrgicas como erupcin cutnea, picazn o urticarias, hinchazn de la cara, labios o lengua -conteos sanguneos bajos - este medicamento puede reducir la cantidad de glbulos blancos, glbulos rojos y plaquetas. Su riesgo de infeccin y [place]. -signos de infeccin - fiebre o escalofros, tos, dolor de garganta, Social research officer, government o dificultad para orinar -signos de reduccin de plaquetas o sangrado - magulladuras, puntos rojos en la piel, heces de color oscuro o con aspecto alquitranado, sangre en la orina -signos de reduccin de glbulos rojos - cansancio o debilidad inusual, desmayos, sensacin de mareo -problemas respiratorios -cambios en la visin -dolor en el pecho -llagas en la boca -nuseas, vmito -dolor, hinchazn, enrojecimiento en el lugar de la inyeccin -hormigueo, dolor, entumecimiento de manos o pies -enrojecimiento, hinchazn o llagas en las manos o pies -dolor estomacal -sangrado inusuales Efectos secundarios que, por lo general, no requieren atencin mdica (debe informarlos a su mdico o a su profesional de la salud si persisten o si son molestos): -cambios en las uas de las manos o pies -diarrea -picazn o sequedad de la piel -cada del cabello -dolor de cabeza -prdida del apetito -sensibilidad de los ojos a la luz -Higher education careers adviser -ojos inusualmente llorosos Puede ser que esta lista no menciona todos los posibles efectos secundarios. Comunquese a su mdico por asesoramiento mdico [employer]. Usted puede informar los efectos secundarios a la FDA por telfono al 1-800-FDA-1088. Dnde debo guardar mi medicina? Este medicamento se administra en hospitales o clnicas y no necesitar  guardarlo en su domicilio. ATENCIN: Este folleto es un resumen. Puede ser que no cubra toda la posible informacin. Si usted tiene preguntas acerca de esta medicina, consulte con su mdico, su farmacutico o su profesional de Technical sales engineer.    2016, Elsevier/Gold Standard. (2015-01-18 00:00:00) Leucovorin injection Qu es este medicamento? La LEUCOVORINA se [place] para prevenir o tratar [employer] nocivos de ciertos medicamentos. Este medicamento tambin sirve para tratar la anemia provocada por una nivel bajo de cido flico en el cuerpo. Tambin se puede administrar con 5-fluorouracilo (5-FU), para tratar el cncer de colon. Este medicamento puede ser utilizado para otros usos; si tiene alguna pregunta consulte con su proveedor de atencin mdica o con su farmacutico. Qu le debo informar a mi profesional de la salud antes de tomar este medicamento? Necesita saber si usted presenta alguno de los siguientes problemas o situaciones: -anemia debido a bajos niveles de vitamina B-12 en la sangre -una reaccin alrgica o inusual a la  leucovorina, cido flico, a otros medicamentos, alimentos, colorantes o conservantes -si est embarazada o buscando quedar embarazada -si est amamantando a un beb Cmo debo utilizar este medicamento? Este medicamento se administra mediante inyeccin por va intramuscular o intravenosa. Lo administra un profesional de Technical sales engineer en un hospital o en un entorno clnico. Hable con su pediatra para informarse acerca del uso de este medicamento en nios. Puede requerir atencin especial. Sobredosis: Pngase en contacto inmediatamente con un centro toxicolgico o una sala de urgencia si usted cree que haya tomado demasiado medicamento. ATENCIN: [employer] es solo para usted. No comparta este medicamento con nadie. Qu sucede si me olvido de una dosis? No se aplica en este caso. Qu puede interactuar con este  medicamento? -capecitabina -fluorouracilo -fenobarbital -fenitona -primidona -trimetoprima- sulfametoxasol Puede ser que esta lista no menciona todas las posibles interacciones. Informe a su profesional de [employer]	[place] de [employer] productos a base de hierbas, medicamentos de [place] o suplementos nutritivos que est tomando. Si usted fuma, consume bebidas alcohlicas o si utiliza drogas ilegales, indqueselo tambin a su profesional de [employer]	[place]. Algunas sustancias pueden interactuar con su medicamento. A qu debo estar atento al usar Coca-Cola? Se supervisar su estado de salud atentamente mientras reciba este medicamento. Este medicamento puede aumentar los efectos secundarios de 5-fluorouracilo, 5-FU. Si tiene diarrea o [employer] boca que no mejoran o que [place], consulte a su mdico o a su profesional de [employer]	[place]. Qu efectos secundarios puedo tener al [employer] este medicamento? Efectos secundarios que debe informar a su mdico o a Barrister's clerk de la salud tan pronto como sea posible: -Chief of Staff como erupcin cutnea, picazn o urticarias, hinchazn de la cara, labios o lengua -problemas respiratorios -fiebre, infeccin -llagas en la boca -sangrado, magulladuras inusuales -cansancio o debilidad inusual Efectos secundarios que, por lo general, no requieren atencin mdica (debe informarlos a su mdico o a su profesional de la salud si persisten o si son molestos): -estreimiento o diarrea -prdida del apetito -nuseas, vmito Puede ser que [employer] no menciona todos los posibles efectos secundarios. Comunquese a su mdico por asesoramiento mdico [employer]. Usted puede informar los efectos secundarios a la FDA por telfono al 1-800-FDA-1088. Dnde debo guardar mi medicina? Este medicamento se administra en hospitales o clnicas y no necesitar guardarlo en su domicilio. ATENCIN: Este folleto es un resumen. Puede ser que no cubra toda  la posible informacin. Si usted tiene preguntas acerca de esta medicina, consulte con su mdico, su farmacutico o su profesional de Technical sales engineer.    2016, Elsevier/Gold Standard. (2015-01-18 00:00:00)

## 2016-10-19 NOTE — Telephone Encounter (Signed)
Called Dr. Kem Parkinson office to speak with her nurse. I spoke with Sherry-RN and informed her that patient was in our office this morning and was complaining of Right Lower Quadrant pain all last night and was requesting pain medications. I told Judeen Hammans that we wouldn't be able to give him pain medication since all we did on him was placed a port-a-cath. In addition, I asked Judeen Hammans to look into his chart and tell me when patient would be able to return to their clinic to start Chemo therapy since patient didn't have a clue on when he needed to return. Judeen Hammans then stated that patient wasn't suppose to have a port placed but surgery. However, I told her that according to Dr. Kem Parkinson last note, she requested to have a consult a port-a-cath and discuss about a his malignant partially obstructing tumor in the recto-sigmoid colon. However, when patient came in to his appointment he discussed with the patient that if his symptoms were to worsen, if he develops any abdominal distention or nausea, or if he stops passing flatus or having bowel movements, that we will need to re-evaluate him for a diverting colostomy.  He agreed to this if need be in the future and will have close follow up with [place] in the clinic.  Patient will be seen by Dr. [name] next week. In the meantime, hopefully patient gets an appointment scheduled with Dr. Mike Gip to discuss his next step on when to start his chemotherapy and his pain.

## 2016-10-19 NOTE — Patient Instructions (Signed)
Please call our office with any questions or concerns. 

## 2016-10-19 NOTE — Progress Notes (Addendum)
S/p port placement by Dr. [name], no complaints. CXR no PTX. Rectal CA w multiple bilateral lungs mets and liver mets. Taking PO and passing gas, No N/V  no evidence of bowel obstruction  PE NAD Chest wall w port in place, wound healing well, no infection  A/P doing well F/u onc and new radiation oncology consultation RTC in a Week w  Dr. [name] to make sure he won't require a diverting colostomy. No surgical intervention at this time

## 2016-10-19 NOTE — Progress Notes (Signed)
START ON PATHWAY REGIMEN - Colorectal  [id number]: mFOLFOX6 q14 Days   A cycle is every 14 days:     Oxaliplatin (Eloxatin(R)) 85 mg/m2 in 250 mL D5W IV over 2 hours day 1, q14 days Dose Mod: None     Leucovorin 400 mg/m2 in 250 mL D5W IV over 2 hours day 1, q14 days, followed immediately by Dose Mod: None     5-Fluorouracil 400 mg/m2 IV bolus over 2-4 minutes day 1, q14 days Dose Mod: None     5-Fluorouracil 2,400 mg/m2 in _____mL NS CIV as a 46 hour infusion starting on day 1, q14 days Dose Mod: None Additional Orders: **Note: order sheet contains two q14 day cycles**  **Always confirm dose/schedule in your pharmacy ordering system**    Patient Characteristics: Metastatic Colorectal, First Line, Nonsurgical Candidate, KRAS Mutation Positive/Unknown, BRAF Wild-Type/Unknown, PS = 0,1; Bevacizumab Ineligible AJCC T Stage: X AJCC N Stage: X AJCC Stage Grouping: IVB AJCC M Stage: X Current evidence of distant metastases? Yes BRAF Mutation Status: Awaiting Test Results KRAS/NRAS Mutation Status: Awaiting Test Results Line of therapy: First Line Would you be surprised if this patient died  in the next year? I would be surprised if this patient died in the next year Performance Status: PS = 0, 1 Bevacizumab Eligibility: Ineligible  Intent of Therapy: Non-Curative / Palliative Intent, Discussed with Patient

## 2016-10-22 ENCOUNTER — Other Ambulatory Visit: Payer: Self-pay | Admitting: *Deleted

## 2016-10-22 ENCOUNTER — Inpatient Hospital Stay: Payer: 59

## 2016-10-22 DIAGNOSIS — Z95828 Presence of other vascular implants and grafts: Secondary | ICD-10-CM

## 2016-10-22 DIAGNOSIS — C19 Malignant neoplasm of rectosigmoid junction: Secondary | ICD-10-CM

## 2016-10-22 DIAGNOSIS — C189 Malignant neoplasm of colon, unspecified: Secondary | ICD-10-CM

## 2016-10-22 MED ORDER — LIDOCAINE-PRILOCAINE 2.5-2.5 % EX CREA: TOPICAL_CREAM | CUTANEOUS | 1 refills | Status: DC

## 2016-10-23 ENCOUNTER — Ambulatory Visit: Payer: 59 | Admitting: Surgery

## 2016-10-23 ENCOUNTER — Other Ambulatory Visit: Payer: Self-pay

## 2016-10-23 ENCOUNTER — Inpatient Hospital Stay: Payer: 59

## 2016-10-23 ENCOUNTER — Other Ambulatory Visit: Payer: Self-pay | Admitting: Hematology and Oncology

## 2016-10-23 ENCOUNTER — Ambulatory Visit
Admission: RE | Admit: 2016-10-23 | Discharge: 2016-10-23 | Disposition: A | Discharge status: Home / Self Care | Payer: 59 | Source: Ambulatory Visit | Attending: Hematology and Oncology | Admitting: Hematology and Oncology

## 2016-10-23 ENCOUNTER — Inpatient Hospital Stay (HOSPITAL_BASED_OUTPATIENT_CLINIC_OR_DEPARTMENT_OTHER): Payer: 59 | Admitting: Hematology and Oncology

## 2016-10-23 VITALS — BP 108/67 | HR 86 | Temp 96.7°F | Resp 18 | Wt 137.8 lb

## 2016-10-23 DIAGNOSIS — K831 Obstruction of bile duct: Secondary | ICD-10-CM

## 2016-10-23 DIAGNOSIS — C189 Malignant neoplasm of colon, unspecified: Secondary | ICD-10-CM | POA: Diagnosis not present

## 2016-10-23 DIAGNOSIS — C787 Secondary malignant neoplasm of liver and intrahepatic bile duct: Secondary | ICD-10-CM | POA: Insufficient documentation

## 2016-10-23 DIAGNOSIS — E119 Type 2 diabetes mellitus without complications: Secondary | ICD-10-CM | POA: Diagnosis present

## 2016-10-23 DIAGNOSIS — C7800 Secondary malignant neoplasm of unspecified lung: Secondary | ICD-10-CM | POA: Diagnosis present

## 2016-10-23 DIAGNOSIS — C19 Malignant neoplasm of rectosigmoid junction: Secondary | ICD-10-CM

## 2016-10-23 DIAGNOSIS — K2210 Ulcer of esophagus without bleeding: Secondary | ICD-10-CM | POA: Diagnosis not present

## 2016-10-23 DIAGNOSIS — R1011 Right upper quadrant pain: Secondary | ICD-10-CM | POA: Diagnosis not present

## 2016-10-23 DIAGNOSIS — E43 Unspecified severe protein-calorie malnutrition: Secondary | ICD-10-CM | POA: Diagnosis present

## 2016-10-23 DIAGNOSIS — E876 Hypokalemia: Secondary | ICD-10-CM | POA: Diagnosis not present

## 2016-10-23 DIAGNOSIS — D696 Thrombocytopenia, unspecified: Secondary | ICD-10-CM

## 2016-10-23 DIAGNOSIS — Z6822 Body mass index (BMI) 22.0-22.9, adult: Secondary | ICD-10-CM

## 2016-10-23 DIAGNOSIS — Z79899 Other long term (current) drug therapy: Secondary | ICD-10-CM

## 2016-10-23 DIAGNOSIS — R152 Fecal urgency: Secondary | ICD-10-CM | POA: Diagnosis not present

## 2016-10-23 DIAGNOSIS — K219 Gastro-esophageal reflux disease without esophagitis: Secondary | ICD-10-CM | POA: Diagnosis not present

## 2016-10-23 DIAGNOSIS — Z809 Family history of malignant neoplasm, unspecified: Secondary | ICD-10-CM

## 2016-10-23 DIAGNOSIS — R590 Localized enlarged lymph nodes: Secondary | ICD-10-CM

## 2016-10-23 DIAGNOSIS — C772 Secondary and unspecified malignant neoplasm of intra-abdominal lymph nodes: Secondary | ICD-10-CM | POA: Diagnosis not present

## 2016-10-23 DIAGNOSIS — E785 Hyperlipidemia, unspecified: Secondary | ICD-10-CM | POA: Diagnosis not present

## 2016-10-23 DIAGNOSIS — R17 Unspecified jaundice: Secondary | ICD-10-CM | POA: Diagnosis not present

## 2016-10-23 DIAGNOSIS — Z7984 Long term (current) use of oral hypoglycemic drugs: Secondary | ICD-10-CM | POA: Diagnosis not present

## 2016-10-23 DIAGNOSIS — Z5111 Encounter for antineoplastic chemotherapy: Secondary | ICD-10-CM | POA: Diagnosis not present

## 2016-10-23 DIAGNOSIS — Z87891 Personal history of nicotine dependence: Secondary | ICD-10-CM

## 2016-10-23 DIAGNOSIS — E871 Hypo-osmolality and hyponatremia: Secondary | ICD-10-CM | POA: Diagnosis present

## 2016-10-23 DIAGNOSIS — R7989 Other specified abnormal findings of blood chemistry: Secondary | ICD-10-CM | POA: Diagnosis present

## 2016-10-23 DIAGNOSIS — Z9889 Other specified postprocedural states: Secondary | ICD-10-CM

## 2016-10-23 DIAGNOSIS — R918 Other nonspecific abnormal finding of lung field: Secondary | ICD-10-CM

## 2016-10-23 DIAGNOSIS — D693 Immune thrombocytopenic purpura: Secondary | ICD-10-CM | POA: Diagnosis not present

## 2016-10-23 DIAGNOSIS — I10 Essential (primary) hypertension: Secondary | ICD-10-CM | POA: Diagnosis not present

## 2016-10-23 DIAGNOSIS — K2970 Gastritis, unspecified, without bleeding: Secondary | ICD-10-CM | POA: Diagnosis not present

## 2016-10-23 LAB — CBC WITH DIFFERENTIAL/PLATELET
Basophils Absolute: 0 10*3/uL (ref 0–0.1)
Basophils Relative: 0 %
Eosinophils Absolute: 0.1 10*3/uL (ref 0–0.7)
Eosinophils Relative: 1 %
HCT: 32.6 % — ABNORMAL LOW (ref 40.0–52.0)
Hemoglobin: 11.1 g/dL — ABNORMAL LOW (ref 13.0–18.0)
Lymphocytes Relative: 6 %
Lymphs Abs: 0.6 10*3/uL — ABNORMAL LOW (ref 1.0–3.6)
MCH: 30.3 pg (ref 26.0–34.0)
MCHC: 34 g/dL (ref 32.0–36.0)
MCV: 89.1 fL (ref 80.0–100.0)
Monocytes Absolute: 0.9 10*3/uL (ref 0.2–1.0)
Monocytes Relative: 9 %
Neutro Abs: 8.4 10*3/uL — ABNORMAL HIGH (ref 1.4–6.5)
Neutrophils Relative %: 84 %
Platelets: 172 10*3/uL (ref 150–440)
RBC: 3.66 MIL/uL — ABNORMAL LOW (ref 4.40–5.90)
RDW: 14.3 % (ref 11.5–14.5)
WBC: 10.1 10*3/uL (ref 3.8–10.6)

## 2016-10-23 LAB — COMPREHENSIVE METABOLIC PANEL
ALT: 171 U/L — ABNORMAL HIGH (ref 17–63)
AST: 159 U/L — ABNORMAL HIGH (ref 15–41)
Albumin: 2.6 g/dL — ABNORMAL LOW (ref 3.5–5.0)
Alkaline Phosphatase: 925 U/L — ABNORMAL HIGH (ref 38–126)
Anion gap: 9 (ref 5–15)
BUN: 16 mg/dL (ref 6–20)
CO2: 24 mmol/L (ref 22–32)
Calcium: 8.5 mg/dL — ABNORMAL LOW (ref 8.9–10.3)
Chloride: 96 mmol/L — ABNORMAL LOW (ref 101–111)
Creatinine, Ser: 0.6 mg/dL — ABNORMAL LOW (ref 0.61–1.24)
GFR calc Af Amer: >60 mL/min (ref >60)
GFR calc non Af Amer: >60 mL/min (ref >60)
Glucose, Bld: 131 mg/dL — ABNORMAL HIGH (ref 65–99)
Potassium: 3.6 mmol/L (ref 3.5–5.1)
Sodium: 129 mmol/L — ABNORMAL LOW (ref 135–145)
Total Bilirubin: 5.6 mg/dL — ABNORMAL HIGH (ref 0.3–1.2)
Total Protein: 7.4 g/dL (ref 6.5–8.1)

## 2016-10-23 MED ORDER — IOPAMIDOL (ISOVUE-300) INJECTION 61%
100.0000 mL | Freq: Once | INTRAVENOUS | Status: AC | PRN
  Administered 2016-10-23: 100 mL via INTRAVENOUS

## 2016-10-23 MED ORDER — HEPARIN SOD (PORK) LOCK FLUSH 100 UNIT/ML IV SOLN: 500.0000 [IU] | Freq: Once | INTRAVENOUS | Status: AC

## 2016-10-23 MED ORDER — SODIUM CHLORIDE 0.9 % IJ SOLN
10.0000 mL | Freq: Once | INTRAMUSCULAR | Status: AC
Start: 2016-10-23 — End: ?
  Filled 2016-10-23: qty 10

## 2016-10-23 NOTE — Progress Notes (Signed)
Patient c/o pain in left leg @ hip area. Pain comes and goes.  Further c/o having urge to have bowel movements all day long.  When unable to have movement he strains and he only gets blood.

## 2016-10-23 NOTE — Progress Notes (Signed)
[facility] day:  10/23/2016  Chief Complaint: Nathan Atkinson is a 65 y.o. male with metastatic colorectal cancer who is seen for assessment prior to cycle #1 FOLFOX chemotherapy.  HPI: The patient was last seen in the medical oncology clinic on 10/08/2016.  At that time, we discussed systemic chemotherapy (FOLFOX) for metastatic colon cancer.  Concern was raised for pending obstruction.  He was referred to surgery.  CEA was 8.0.  LFTs included a SGOT 73, SGPT 141, bilirubin 0.3, and alkaline phosphatase 473.  He met with Dr. Olean Ree on 10/11/2016.  He was felt to had a feeling of fullness of the rectum which in turn is caused him urgency to have a bowel movement.  He had discomfort from straining but only a small amount of stool comes at a time because the vault was not full of stool but the mass instead.  He was not distended, and not having any nausea.  He had flatus and bowel movements.  A diverting colostomy was not recommended.  He underwent port-a-cath placement by Dr. [name] on 10/12/2016.  He was seen by Dr. Caroleen Hamman on 10/19/2016.  He had no evidence of bowel obstruction.  He attended the chemotherapy class.  Symptomatically, he notes right upper quadrant pain.  He has a sense of urgency.  Stools are soft and small.  He feels his symptoms are similar to his last visit.  He denies any fever.   Past Medical History:  Diagnosis Date  . Cataract 08/2015   forming  . Colorectal cancer ([place]) 10/08/2016  . Diabetes mellitus without complication ([place])   . Hyperlipidemia   . Hypertension   . Platelets decreased ([place])     Past Surgical History:  Procedure Laterality Date  . CARDIAC CATHETERIZATION  2003  . COLONOSCOPY N/A 10/04/2016   Procedure: COLONOSCOPY;  Surgeon: Lollie Sails, MD;  Location: [facility] ENDOSCOPY;  Service: Endoscopy;  Laterality: N/A;  . ESOPHAGOGASTRODUODENOSCOPY (EGD) WITH PROPOFOL N/A 10/04/2016    Procedure: ESOPHAGOGASTRODUODENOSCOPY (EGD) WITH PROPOFOL;  Surgeon: Lollie Sails, MD;  Location: [facility] ENDOSCOPY;  Service: Endoscopy;  Laterality: N/A;  . PORTACATH PLACEMENT Left 10/12/2016   Procedure: INSERTION PORT-A-CATH;  Surgeon: Olean Ree, MD;  Location: [facility] ORS;  Service: General;  Laterality: Left;    Family History  Problem Relation Age of Onset  . Cancer Brother     not sure what kind    Social History:  reports that he quit smoking about 21 years ago. His smoking use included Cigarettes. He quit after 10.00 years of use. He has never used smokeless tobacco. He reports that he drinks alcohol. He reports that he does not use drugs.  He works in Charity fundraiser at [employer].  The patient is accompanied by his wife, [name], and the [place] interpreter today.  Allergies: No Known Allergies  Current Medications: Current Outpatient Prescriptions  Medication Sig Dispense Refill  . atorvastatin (LIPITOR) 40 MG tablet Take 40 mg by mouth at bedtime. Reported on 05/09/2016    . enalapril (VASOTEC) 10 MG tablet Take 10 mg by mouth at bedtime.     Marland Kitchen glipiZIDE (GLUCOTROL) 5 MG tablet Take 5 mg by mouth daily after supper.     . lidocaine-prilocaine (EMLA) cream Apply cream 1 hour before chemotherapy treatment 30 g 1  . metFORMIN (GLUCOPHAGE) 500 MG tablet Take 1,000 mg by mouth 2 (two) times daily. Reported on 08/22/17/2017- taking 2-533m bid Metformin- causing extreme number of  bowel movements and abd. Pain Reported on 09/05/16- now taking 2 tabs q am and 1 tab in the pm- diarrhea has decreased.    Marland Kitchen omeprazole (PRILOSEC) 20 MG capsule Take 1 capsule (20 mg total) by mouth daily. 30 capsule 3  . Polyethylene Glycol POWD Take 17 g by mouth daily. 527 g 5  . HYDROcodone-acetaminophen (NORCO) 5-325 MG tablet Take 1 tablet by mouth every 4 (four) hours as needed for moderate pain. (Patient not taking: Reported on 10/23/2016) 20 tablet 0  . potassium chloride (K-DUR) 10 MEQ tablet Take 4  tablets (40 mEq total) by mouth daily. (Patient not taking: Reported on 10/23/2016) 4 tablet 0   No current facility-administered medications for this visit.    Facility-Administered Medications Ordered in Other Visits  Medication Dose Route Frequency Provider Last Rate Last Dose  . heparin lock flush 100 unit/mL  500 Units Intravenous Once Lequita Asal, MD      . sodium chloride 0.9 % injection 10 mL  10 mL Intravenous Once Lequita Asal, MD        Review of Systems:  GENERAL:  Feels "the same".  Working.  No fevers or sweats.  Weight loss of 7 pounds since last visit. PERFORMANCE STATUS (ECOG):  0 HEENT:  No visual changes, runny nose, sore throat, mouth sores or tenderness. Lungs: No shortness of breath or cough.  No hemoptysis. Cardiac:  No chest pain, palpitations, orthopnea, or PND. GI:  Sense of urgency.  Small frequent soft stools.  Intermittent hematochezia.  No nausea, vomiting, diarrhea, or melena. GU:  No urgency, frequency, dysuria, or hematuria. Musculoskeletal:  No back pain.  No joint pain.  No muscle tenderness. Extremities:  Intermittent left leg pain.  No swelling. Skin:  No rashes or skin changes. Neuro:  No headache, numbness or weakness, balance or coordination issues. Endocrine:  Diabetes.  No tthyroid issues, hot flashes or night sweats. Psych:  No mood changes, depression or anxiety. Pain:  No focal pain. Review of systems:  All other systems reviewed and found to be negative.  Physical Exam: Blood pressure 108/67, pulse 86, temperature (!) 96.7 F (35.9 C), temperature source Tympanic, resp. rate 18, weight 137 lb 12.6 oz (62.5 kg). GENERAL:  Well developed, well nourished, gentleman sitting comfortably in the exam room in no acute distress. MENTAL STATUS:  Alert and oriented to person, place and time. HEAD:  Shaved head.  Normocephalic, atraumatic, face symmetric, no Cushingoid features. EYES:  Glasses.  Brown eyes.  Scleral icterus.  Pupils  equal round and reactive to light and accomodation.  No conjunctivitis. ENT:  Oropharynx clear without lesion.  Tongue normal. Mucous membranes moist.  RESPIRATORY:  Clear to auscultation without rales, wheezes or rhonchi. CARDIOVASCULAR:  Regular rate and rhythm without murmur, rub or gallop. ABDOMEN:  Soft, non-tender, with active bowel sounds, and no hepatosplenomegaly.  No masses. RECTAL:  Hard irregular predominant right sided rectal mass. SKIN:  No rashes, ulcers or lesions. EXTREMITIES: No edema, no skin discoloration or tenderness.  No palpable cords. LYMPH NODES: No palpable cervical, supraclavicular, axillary or inguinal adenopathy  NEUROLOGICAL: Unremarkable. PSYCH:  Appropriate.   Infusion on 10/23/2016  Component Date Value Ref Range Status  . WBC 10/23/2016 10.1  3.8 - 10.6 K/uL Final  . RBC 10/23/2016 3.66* 4.40 - 5.90 MIL/uL Final  . Hemoglobin 10/23/2016 11.1* 13.0 - 18.0 g/dL Final  . HCT 10/23/2016 32.6* 40.0 - 52.0 % Final  . MCV 10/23/2016 89.1  80.0 - 100.0  fL Final  . MCH 10/23/2016 30.3  26.0 - 34.0 pg Final  . MCHC 10/23/2016 34.0  32.0 - 36.0 g/dL Final  . RDW 10/23/2016 14.3  11.5 - 14.5 % Final  . Platelets 10/23/2016 172  150 - 440 K/uL Final  . Neutrophils Relative % 10/23/2016 84  % Final  . Neutro Abs 10/23/2016 8.4* 1.4 - 6.5 K/uL Final  . Lymphocytes Relative 10/23/2016 6  % Final  . Lymphs Abs 10/23/2016 0.6* 1.0 - 3.6 K/uL Final  . Monocytes Relative 10/23/2016 9  % Final  . Monocytes Absolute 10/23/2016 0.9  0.2 - 1.0 K/uL Final  . Eosinophils Relative 10/23/2016 1  % Final  . Eosinophils Absolute 10/23/2016 0.1  0 - 0.7 K/uL Final  . Basophils Relative 10/23/2016 0  % Final  . Basophils Absolute 10/23/2016 0.0  0 - 0.1 K/uL Final  . Sodium 10/23/2016 129* 135 - 145 mmol/L Final  . Potassium 10/23/2016 3.6  3.5 - 5.1 mmol/L Final  . Chloride 10/23/2016 96* 101 - 111 mmol/L Final  . CO2 10/23/2016 24  22 - 32 mmol/L Final  . Glucose, Bld  10/23/2016 131* 65 - 99 mg/dL Final  . BUN 10/23/2016 16  6 - 20 mg/dL Final  . Creatinine, Ser 10/23/2016 0.60* 0.61 - 1.24 mg/dL Final  . Calcium 10/23/2016 8.5* 8.9 - 10.3 mg/dL Final  . Total Protein 10/23/2016 7.4  6.5 - 8.1 g/dL Final  . Albumin 10/23/2016 2.6* 3.5 - 5.0 g/dL Final  . AST 10/23/2016 159* 15 - 41 U/L Final  . ALT 10/23/2016 171* 17 - 63 U/L Final  . Alkaline Phosphatase 10/23/2016 925* 38 - 126 U/L Final  . Total Bilirubin 10/23/2016 5.6* 0.3 - 1.2 mg/dL Final  . GFR calc non Af Amer 10/23/2016 >60  >60 mL/min Final  . GFR calc Af Amer 10/23/2016 >60  >60 mL/min Final   Comment: (NOTE) The eGFR has been calculated using the CKD EPI equation. This calculation has not been validated in all clinical situations. eGFR's persistently <60 mL/min signify possible Chronic Kidney Disease.   . Anion gap 10/23/2016 9  5 - 15 Final    Assessment:  Nathan Atkinson is a 65 y.o. male with metastatic colorectal carcinoma.  He presented with a 4 month history of rectal bleeding.  Abdomen and pelvic CT scan on 09/26/2016 revealed innumerable ill-defined liver lesions compatible with metastatic disease. There were pulmonary nodules in the right lower lobe suggestive of metastatic involvement.  Rectal wall appeared thickened and irregular raising the concern for neoplasm. There was perirectal edema/inflammation and some small perirectal lymph nodes. There was necrotic lymphadenopathy in the upper abdomen with periaortic retroperitoneal adenopathy.  Chest CT on 10/15/2016 revealed multiple pulmonary nodules suspicious for metastatic disease.  Index nodules included an 8.3 x 9.7 mm nodule in the right lower lobe and a 10.0 x 10.0 mm nodule in the medial left lower lobe.  Colonoscopy on 10/04/2016 revealed a malignant partially obstructing tumor in the recto-sigmoid colon.  The mass was circumferential and measured 9 cm in length.  At about 18 cm from the anal verge, stenosis  prevented the scope to be passed further proximally.  Pathology revealed moderately differentiated invasive adenocarcinoma.  CEA was 8.0 on 10/08/2016.  EGD on 10/04/2016 revealed LA Grade A erosive esophagitis, gastritis, and a normal duodenum.  He has a history of chronic ITP.  Previously his platelet count ranged between 50,000 and 60,000. Work-up on 10/12/2014 revealed the following negative  studies: ANA, hepatitis B surface antigen, hepatitis C antibody, HIV testing, ANA, SPEP, UPEP, B12, folate, and LDH.   Symptomatically, he has urgency.  He has new jaundice (bilirubin 5.6) with increased enzymes and alkaline phosphatase (925).  Plan: 1.  Labs today:  CBC with diff, CMP, CEA. 2.  Discuss metastatic colon cancer.  Review CT images with patient and his wife.  Discuss my conversation with surgery.  Discuss signs and symptoms of obstruction.  Discuss plan to proceed with chemotherapy.  Discuss chemotherapy plan with FOLFOX every 2 weeks.  Discuss administration and side effects.  Discuss consideration of addition of Avastin or cetuximab in the future depending on response to therapy.  Plan to avoid Avastin currently as patient may require surgery.  Await KRAS testing. 3.  Follow-up testing for KRAS, NRAS, and BRAF mutational testing + microsatellite instability (MSI) / mismatch repair (MMR). 4.  Discuss plan to postpone chemotherapy today secondary to elevated liver function tests.  Discuss acute change in LFTs in past 2 weeks.  Etiology felt secondary to rapidly progressive liver disease or biliary obstruction.  Discuss chemotherapy options limited with bilirubin > 5.0. 5.  STAT abdomen CT: r/o obstruction. 6.  RTC after CT scan.  Addendum:  Abdomen CT scan on 10/23/2016 revealed increase in intra hepatic biliary duct dilatation.  There was increased inflammation around the gallbladder.  Findings were concerning for biliary obstruction at the level of porta hepatis. Most likely etiology would be  metastatic adenopathy in the porta hepatis constricting the extrahepatic bile duct. Consider ERCP with bile duct stenting.  Common bile duct was normal caliber through the pancreatic head.  There was no pancreatic inflammation.  There was interval increase in size of hepatic metastasis.  There was interval increase in retroperitoneal adenopathy.  Discussed findings with patient and his wife.  Discussed with Dr. Allen Norris.  Plan for admission to [facility] on 10/24/2016 for antibiotics and ERCP on 10/25/2016.  Patient advised to contact clinic immediately if develops a fever or becomes ill.  Over an hour today was spent with the patient.   Lequita Asal, MD  10/23/2016, 11:52 AM

## 2016-10-24 ENCOUNTER — Encounter: Payer: Self-pay | Admitting: Surgery

## 2016-10-24 ENCOUNTER — Encounter: Payer: Self-pay | Admitting: Student

## 2016-10-24 ENCOUNTER — Ambulatory Visit (INDEPENDENT_AMBULATORY_CARE_PROVIDER_SITE_OTHER): Payer: 59 | Admitting: Surgery

## 2016-10-24 ENCOUNTER — Inpatient Hospital Stay
Admission: AD | Admit: 2016-10-24 | Discharge: 2016-10-27 | DRG: 444 | Disposition: A | Discharge status: Home / Self Care | Payer: 59 | Source: Ambulatory Visit | Attending: Internal Medicine | Admitting: Internal Medicine

## 2016-10-24 ENCOUNTER — Encounter: Payer: Self-pay | Admitting: Hematology and Oncology

## 2016-10-24 VITALS — BP 125/77 | HR 93 | Temp 98.1°F | Ht 63.0 in | Wt 137.0 lb

## 2016-10-24 DIAGNOSIS — Z7984 Long term (current) use of oral hypoglycemic drugs: Secondary | ICD-10-CM | POA: Diagnosis not present

## 2016-10-24 DIAGNOSIS — K838 Other specified diseases of biliary tract: Secondary | ICD-10-CM | POA: Diagnosis not present

## 2016-10-24 DIAGNOSIS — E43 Unspecified severe protein-calorie malnutrition: Secondary | ICD-10-CM | POA: Insufficient documentation

## 2016-10-24 DIAGNOSIS — E876 Hypokalemia: Secondary | ICD-10-CM | POA: Diagnosis present

## 2016-10-24 DIAGNOSIS — K831 Obstruction of bile duct: Secondary | ICD-10-CM | POA: Diagnosis present

## 2016-10-24 DIAGNOSIS — C772 Secondary and unspecified malignant neoplasm of intra-abdominal lymph nodes: Secondary | ICD-10-CM | POA: Diagnosis present

## 2016-10-24 DIAGNOSIS — C189 Malignant neoplasm of colon, unspecified: Secondary | ICD-10-CM | POA: Diagnosis not present

## 2016-10-24 DIAGNOSIS — C7800 Secondary malignant neoplasm of unspecified lung: Secondary | ICD-10-CM | POA: Diagnosis present

## 2016-10-24 DIAGNOSIS — K219 Gastro-esophageal reflux disease without esophagitis: Secondary | ICD-10-CM | POA: Diagnosis present

## 2016-10-24 DIAGNOSIS — R7989 Other specified abnormal findings of blood chemistry: Secondary | ICD-10-CM | POA: Diagnosis present

## 2016-10-24 DIAGNOSIS — C19 Malignant neoplasm of rectosigmoid junction: Secondary | ICD-10-CM | POA: Diagnosis not present

## 2016-10-24 DIAGNOSIS — C787 Secondary malignant neoplasm of liver and intrahepatic bile duct: Secondary | ICD-10-CM

## 2016-10-24 DIAGNOSIS — I10 Essential (primary) hypertension: Secondary | ICD-10-CM | POA: Diagnosis not present

## 2016-10-24 DIAGNOSIS — C20 Malignant neoplasm of rectum: Secondary | ICD-10-CM

## 2016-10-24 DIAGNOSIS — Z87891 Personal history of nicotine dependence: Secondary | ICD-10-CM | POA: Diagnosis not present

## 2016-10-24 DIAGNOSIS — Z9889 Other specified postprocedural states: Secondary | ICD-10-CM | POA: Diagnosis not present

## 2016-10-24 DIAGNOSIS — E871 Hypo-osmolality and hyponatremia: Secondary | ICD-10-CM | POA: Diagnosis present

## 2016-10-24 DIAGNOSIS — Z95828 Presence of other vascular implants and grafts: Secondary | ICD-10-CM

## 2016-10-24 DIAGNOSIS — C187 Malignant neoplasm of sigmoid colon: Secondary | ICD-10-CM | POA: Diagnosis not present

## 2016-10-24 DIAGNOSIS — K6289 Other specified diseases of anus and rectum: Secondary | ICD-10-CM | POA: Diagnosis not present

## 2016-10-24 DIAGNOSIS — Z6822 Body mass index (BMI) 22.0-22.9, adult: Secondary | ICD-10-CM | POA: Diagnosis not present

## 2016-10-24 DIAGNOSIS — Z79899 Other long term (current) drug therapy: Secondary | ICD-10-CM | POA: Diagnosis not present

## 2016-10-24 DIAGNOSIS — E119 Type 2 diabetes mellitus without complications: Secondary | ICD-10-CM | POA: Diagnosis not present

## 2016-10-24 DIAGNOSIS — R748 Abnormal levels of other serum enzymes: Secondary | ICD-10-CM | POA: Diagnosis not present

## 2016-10-24 DIAGNOSIS — Z809 Family history of malignant neoplasm, unspecified: Secondary | ICD-10-CM | POA: Diagnosis not present

## 2016-10-24 LAB — CBC WITH DIFFERENTIAL/PLATELET
Basophils Absolute: 0 10*3/uL (ref 0–0.1)
Basophils Relative: 0 %
EOS ABS: 0.1 10*3/uL (ref 0–0.7)
Eosinophils Relative: 1 %
HEMATOCRIT: 31.8 % — AB (ref 40.0–52.0)
HEMOGLOBIN: 11 g/dL — AB (ref 13.0–18.0)
LYMPHS ABS: 0.6 10*3/uL — AB (ref 1.0–3.6)
LYMPHS PCT: 7 %
MCH: 30.6 pg (ref 26.0–34.0)
MCHC: 34.5 g/dL (ref 32.0–36.0)
MCV: 88.6 fL (ref 80.0–100.0)
MONOS PCT: 9 %
Monocytes Absolute: 0.8 10*3/uL (ref 0.2–1.0)
NEUTROS ABS: 7.1 10*3/uL — AB (ref 1.4–6.5)
NEUTROS PCT: 83 %
Platelets: 152 10*3/uL (ref 150–440)
RBC: 3.59 MIL/uL — AB (ref 4.40–5.90)
RDW: 14.1 % (ref 11.5–14.5)
WBC: 8.6 10*3/uL (ref 3.8–10.6)

## 2016-10-24 LAB — COMPREHENSIVE METABOLIC PANEL
ALBUMIN: 2.3 g/dL — AB (ref 3.5–5.0)
ALK PHOS: 999 U/L — AB (ref 38–126)
ALT: 186 U/L — AB (ref 17–63)
AST: 158 U/L — ABNORMAL HIGH (ref 15–41)
Anion gap: 8 (ref 5–15)
BILIRUBIN TOTAL: 6.9 mg/dL — AB (ref 0.3–1.2)
BUN: 14 mg/dL (ref 6–20)
CALCIUM: 8.5 mg/dL — AB (ref 8.9–10.3)
CO2: 25 mmol/L (ref 22–32)
CREATININE: 0.5 mg/dL — AB (ref 0.61–1.24)
Chloride: 97 mmol/L — ABNORMAL LOW (ref 101–111)
GFR calc non Af Amer: >60 mL/min (ref >60)
GLUCOSE: 192 mg/dL — AB (ref 65–99)
Potassium: 3.3 mmol/L — ABNORMAL LOW (ref 3.5–5.1)
SODIUM: 130 mmol/L — AB (ref 135–145)
TOTAL PROTEIN: 7.1 g/dL (ref 6.5–8.1)

## 2016-10-24 LAB — GLUCOSE, CAPILLARY
Glucose-Capillary: 166 mg/dL — ABNORMAL HIGH (ref 65–99)
Glucose-Capillary: 85 mg/dL (ref 65–99)

## 2016-10-24 LAB — CEA: CEA: 22.6 ng/mL — ABNORMAL HIGH (ref 0.0–4.7)

## 2016-10-24 MED ORDER — POTASSIUM CHLORIDE CRYS ER 20 MEQ PO TBCR
40.0000 meq | EXTENDED_RELEASE_TABLET | Freq: Every day | ORAL | Status: DC
  Administered 2016-10-25 – 2016-10-26 (×2): 40 meq via ORAL
  Filled 2016-10-24 (×2): qty 2

## 2016-10-24 MED ORDER — POLYETHYLENE GLYCOL 3350 17 G PO PACK: 17.0000 g | PACK | Freq: Every day | ORAL | Status: DC

## 2016-10-24 MED ORDER — METFORMIN HCL 500 MG PO TABS
1000.0000 mg | ORAL_TABLET | Freq: Two times a day (BID) | ORAL | Status: DC
  Administered 2016-10-24: 1000 mg via ORAL
  Filled 2016-10-24: qty 2

## 2016-10-24 MED ORDER — INSULIN ASPART 100 UNIT/ML ~~LOC~~ SOLN
0.0000 [IU] | Freq: Three times a day (TID) | SUBCUTANEOUS | Status: DC
  Administered 2016-10-24 – 2016-10-26 (×3): 2 [IU] via SUBCUTANEOUS
  Administered 2016-10-27: 1 [IU] via SUBCUTANEOUS
  Filled 2016-10-24: qty 2
  Filled 2016-10-24: qty 1
  Filled 2016-10-24 (×2): qty 2

## 2016-10-24 MED ORDER — MORPHINE SULFATE (PF) 4 MG/ML IV SOLN
2.0000 mg | INTRAVENOUS | Status: DC | PRN
  Administered 2016-10-24 – 2016-10-25 (×4): 2 mg via INTRAVENOUS
  Filled 2016-10-24 (×4): qty 1

## 2016-10-24 MED ORDER — ENALAPRIL MALEATE 10 MG PO TABS
10.0000 mg | ORAL_TABLET | Freq: Every day | ORAL | Status: DC
  Administered 2016-10-24 – 2016-10-26 (×3): 10 mg via ORAL
  Filled 2016-10-24 (×3): qty 1

## 2016-10-24 MED ORDER — SODIUM CHLORIDE 0.9 % IV SOLN: INTRAVENOUS | Status: DC

## 2016-10-24 MED ORDER — PANTOPRAZOLE SODIUM 40 MG PO TBEC
40.0000 mg | DELAYED_RELEASE_TABLET | Freq: Every day | ORAL | Status: DC
  Administered 2016-10-25 – 2016-10-27 (×3): 40 mg via ORAL
  Filled 2016-10-24 (×3): qty 1

## 2016-10-24 MED ORDER — SODIUM CHLORIDE 0.9 % IV SOLN
INTRAVENOUS | Status: DC
  Administered 2016-10-24 – 2016-10-27 (×7): via INTRAVENOUS

## 2016-10-24 MED ORDER — ATORVASTATIN CALCIUM 20 MG PO TABS
40.0000 mg | ORAL_TABLET | Freq: Every day | ORAL | Status: DC
  Administered 2016-10-24: 23:00:00 40 mg via ORAL
  Filled 2016-10-24: qty 2

## 2016-10-24 MED ORDER — POTASSIUM CHLORIDE 20 MEQ PO PACK
40.0000 meq | PACK | Freq: Once | ORAL | Status: AC
  Administered 2016-10-24: 40 meq via ORAL
  Filled 2016-10-24: qty 2

## 2016-10-24 MED ORDER — ENOXAPARIN SODIUM 40 MG/0.4ML ~~LOC~~ SOLN
40.0000 mg | SUBCUTANEOUS | Status: DC
  Administered 2016-10-24 – 2016-10-26 (×3): 40 mg via SUBCUTANEOUS
  Filled 2016-10-24 (×3): qty 0.4

## 2016-10-24 MED ORDER — POLYETHYLENE GLYCOL 3350 17 G PO PACK: 17.0000 g | PACK | Freq: Every day | ORAL | Status: DC | PRN

## 2016-10-24 MED ORDER — PIPERACILLIN-TAZOBACTAM 3.375 G IVPB
3.3750 g | Freq: Three times a day (TID) | INTRAVENOUS | Status: DC
  Administered 2016-10-24 – 2016-10-27 (×8): 3.375 g via INTRAVENOUS
  Filled 2016-10-24 (×9): qty 50

## 2016-10-24 NOTE — Progress Notes (Signed)
Patient's EMR reviewed.  He is scheduled for ERCP tomorrow with Dr. Allen Norris to evaluate intrahepatic biliary duct dilatation, which is likely the cause of his elevated LFTs.  Therefore, GI will not consult today given this upcoming procedure.  Dr. Vira Agar reviewed this patient and is in agreement with this plan.

## 2016-10-24 NOTE — Progress Notes (Signed)
[place] responded to order for advanced directives requested by a Pt in room 126A. [place] visited Pt who was with his son in the room; he gave the Pt the brochure and educated the Pt on advanced directives, but Pt was not ready to complete it, so the [facility] left the information with the Pt to page him whenever he was ready to complete the Ad. [place] asked the Pt if there was anything else he need, he said no. CH completed the visit and left.     10/24/16 1600  Clinical Encounter Type  Visited With Patient;Patient and family together  Visit Type Initial;Spiritual support  Referral From Nurse  Consult/Referral To Chaplain  Spiritual Encounters  Spiritual Needs Brochure;Prayer;Other (Comment)  Stress Factors  Patient Stress Factors Major life changes  Family Stress Factors Other (Comment)

## 2016-10-24 NOTE — Progress Notes (Signed)
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  He was initially referred for possible obstructive symptoms.  Today he reports he continues to have flatus and bowel movements that are soft.  Denies any distention, but reports RUQ pain today which is new.  His CT scan from yesterday does not show any dilated bowels.  He has urgency and goes to the bathroom several times per day, but this is due to the size of the mass in the rectum giving him a sense of fullness and urgency to have a BM.  He was seen by Dr. Mike Gip yesterday to start chemotherapy but this was placed on hold due to elevated total bilirubin, and the patient is due to be admitted today to hospital for ERCP tomorrow with Dr. Allen Norris.   Vital signs: BP 125/77   Pulse 93   Temp 98.1 F (36.7 C) (Oral)   Ht 5\' 3"  (1.6 m)   Wt 62.1 kg (137 lb)   BMI 24.27 kg/m    Physical Exam: Constitutional:  No acute distress Abdomen: soft, non-distened, with mild tenderness to palpation over the right upper quadrant.  Negative Murphy's sign.  No tenderness over lower abdomen.  Assessment/Plan: 65 yo male with metastatic rectal cancer with liver and pulmonary mets.  --No signs or symptoms of bowel obstruction from the rectal mass at this point. --Patient to be admitted today for ERCP tomorrow for elevated LFTs with worsening liver mets size. --No acute surgical need at this point for a diverting colostomy.  Can otherwise proceed with oncologic management. --Will follow up in a month to re-evaluate his progress.   Melvyn Neth, [place]

## 2016-10-24 NOTE — Progress Notes (Signed)
Pharmacy Antibiotic Note  Mukesh Trashawn Warden is a 65 y.o. male admitted on 10/24/2016 with Intra-abdominal Infection.  Pharmacy has been consulted for Zosyn dosing.  Plan: Will start the patient on Zosyn 3.375 IV EI every 8 hours.   Height: 5\' 4"  (162.6 cm) Weight: 133 lb 11.2 oz (60.6 kg) IBW/kg (Calculated) : 59.2  Temp (24hrs), Avg:98 F (36.7 C), Min:97.8 F (36.6 C), Max:98.1 F (36.7 C)   Recent Labs Lab 10/23/16 1004  WBC 10.1  CREATININE 0.60*    Estimated Creatinine Clearance: 79.1 mL/min (by C-G formula based on SCr of 0.6 mg/dL (L)).    No Known Allergies  Antimicrobials this admission: 11/15 Zosyn >>  Dose adjustments this admission:  Microbiology results: BCx:  UCx:  Sputum:  MRSA PCR:   Thank you for allowing pharmacy to be a part of this patient's care.  Pernell Dupre, PharmD Clinical Pharmacist  10/24/2016 1:30 PM

## 2016-10-24 NOTE — Patient Instructions (Signed)
We would like to follow up with you in one month.Please see the appointment listed below. Please call our office if you have any questions or concerns.

## 2016-10-24 NOTE — Plan of Care (Signed)
Problem: Pain Managment: Goal: General experience of comfort will improve Outcome: Not Progressing Rates pain during assessement at 8/10 mid ABD. Received 2mg  Morphine IV.

## 2016-10-24 NOTE — H&P (Signed)
[facility] [place] NAME: Nathan Atkinson    MR#:  [id number]  DATE OF BIRTH:  [birth year]  DATE OF ADMISSION:  10/24/2016  PRIMARY CARE PHYSICIAN: Baltazar Apo, MD   REQUESTING/REFERRING PHYSICIAN: Lequita Asal, MD  CHIEF COMPLAINT:  No chief complaint on file.   HISTORY OF PRESENT ILLNESS: Nathan Atkinson  is a 65 y.o. male with a known history of  metastatic colorectal cancer, diabetes, hyperlipidemia and hypertension who went for a follow up with his oncologist yesterday. Patient had blood work checked and he was also complaining of being jaundiced. CT scan of the abdomen was done which reveals increase in intrahepatic biliary duct dilation. There was increase inflammation around the gallbladder. Findings were concerning for biliary obstruction at the level of porta hepatis. The oncologist discussed the case with Dr. Allen Norris who will be planning to do an ERCP with stent tomorrow. Patient complains of pain in the right upper quadrant and epigastric region described it as sharp. An intermittent ache in nature. He also has some nausea but no vomiting or diarrhea. He also complains of pain in the left leg. No fevers or chills. PAST MEDICAL HISTORY:   Past Medical History:  Diagnosis Date  . Cataract 08/2015   forming  . Colorectal cancer ([place]) 10/08/2016  . Diabetes mellitus without complication ([address])   . Hyperlipidemia   . Hypertension   . Platelets decreased ([place])     PAST SURGICAL HISTORY: Past Surgical History:  Procedure Laterality Date  . CARDIAC CATHETERIZATION  2003  . COLONOSCOPY N/A 10/04/2016   Procedure: COLONOSCOPY;  Surgeon: Lollie Sails, MD;  [facility] END[facility]COPY;  Service: Endoscopy;  Laterality: N/A;  . ESOPHAGOGASTRODUODENOSCOPY (EGD) WITH PROPOFOL N/A 10/04/2016   Procedure: ESOPHAGOGASTRODUODENOSCOPY (EGD) WITH PROPOFOL;  Surgeon: Lollie Sails, MD;  Location[facility] ENDOSCOPY;   Service: Endoscopy;  Laterality: N/A;  . PORTACATH PLACEMENT Left 10/12/2016   Procedure: INSERTION PORT-A-CATH;  Surgeon: Olean Ree, MD;  Location: [facility] ORS;  Service: General;  Laterality: Left;    SOCIAL HISTORY:  Social History  Substance Use Topics  . Smoking status: Former Smoker    Years: 10.00    Types: Cigarettes    Quit date: 06/15/1995  . Smokeless tobacco: Never Used  . Alcohol use 0.0 oz/week    FAMILY HISTORY:  Family History  Problem Relation Age of Onset  . Cancer Brother     not sure what kind    DRUG ALLERGIES: No Known Allergies  REVIEW OF SYSTEMS:   CONSTITUTIONAL: No fever, fatigue or weakness.  EYES: No blurred or double vision.  EARS, NOSE, AND THROAT: No tinnitus or ear pain.  RESPIRATORY: No cough, shortness of breath, wheezing or hemoptysis.  CARDIOVASCULAR: No chest pain, orthopnea, edema.  GASTROINTESTINAL: Positive nausea, no vomiting, diarrhea or abdominal pain.  GENITOURINARY: No dysuria, hematuria.  ENDOCRINE: No polyuria, nocturia,  HEMATOLOGY: No anemia, easy bruising or bleeding SKIN: No rash or lesion. MUSCULOSKELETAL: Left hip joint pain or arthritis.   NEUROLOGIC: No tingling, numbness, weakness.  PSYCHIATRY: No anxiety or depression.   MEDICATIONS AT HOME:  Prior to Admission medications   Medication Sig Start Date End Date Taking? Authorizing Provider  atorvastatin (LIPITOR) 40 MG tablet Take 40 mg by mouth at bedtime. Reported on 05/09/2016   Yes Historical Provider, MD  enalapril (VASOTEC) 10 MG tablet Take 10 mg by mouth at bedtime.    Yes Historical Provider, MD  glipiZIDE (GLUCOTROL) 5  MG tablet Take 5 mg by mouth daily after supper.    Yes Historical Provider, MD  lidocaine-prilocaine (EMLA) cream Apply cream 1 hour before chemotherapy treatment 10/22/16  Yes Lequita Asal, MD  metFORMIN (GLUCOPHAGE) 500 MG tablet Take 1,000 mg by mouth 2 (two) times daily. Reported on 08/22/17/2017- taking 2-500mg  bid Metformin- causing  extreme number of bowel movements and abd. Pain Reported on 09/05/16- now taking 2 tabs q am and 1 tab in the pm- diarrhea has decreased.   Yes Historical Provider, MD  omeprazole (PRILOSEC) 20 MG capsule Take 1 capsule (20 mg total) by mouth daily. 10/11/16  Yes Olean Ree, MD  Polyethylene Glycol POWD Take 17 g by mouth daily. 10/11/16  Yes Olean Ree, MD  potassium chloride (K-DUR) 10 MEQ tablet Take 4 tablets (40 mEq total) by mouth daily. Patient not taking: Reported on 10/24/2016 10/11/16   Olean Ree, MD      PHYSICAL EXAMINATION:   VITAL SIGNS: Blood pressure 129/72, pulse 90, temperature 97.8 F (36.6 C), temperature source Oral, resp. rate 18, height 5\' 4"  (1.626 m), weight 133 lb 11.2 oz (60.6 kg), SpO2 99 %.  GENERAL:  65 y.o.-year-old patient lying in the bed with no acute distress.  EYES: Pupils equal, round, reactive to light and accommodation. No scleral icterus. Extraocular muscles intact.  HEENT: Head atraumatic, normocephalic. Oropharynx and nasopharynx clear.  NECK:  Supple, no jugular venous distention. No thyroid enlargement, no tenderness.  LUNGS: Normal breath sounds bilaterally, no wheezing, rales,rhonchi or crepitation. No use of accessory muscles of respiration.  CARDIOVASCULAR: S1, S2 normal. No murmurs, rubs, or gallops.  ABDOMEN: Soft, right upper quadrant and epigastric tenderness. Bowel sounds present. No organomegaly or mass.  EXTREMITIES: No pedal edema, cyanosis, or clubbing.  NEUROLOGIC: Cranial nerves II through XII are intact. Muscle strength 5/5 in all extremities. Sensation intact. Gait not checked.  PSYCHIATRIC: The patient is alert and oriented x 3.  SKIN: Jaundice  LABORATORY PANEL:   CBC  Recent Labs Lab 10/23/16 1004 10/24/16 1352  WBC 10.1 8.6  HGB 11.1* 11.0*  HCT 32.6* 31.8*  PLT 172 152  MCV 89.1 88.6  MCH 30.3 30.6  MCHC 34.0 34.5  RDW 14.3 14.1  LYMPHSABS 0.6* 0.6*  MONOABS 0.9 0.8  EOSABS 0.1 0.1  BASOSABS 0.0 0.0    ------------------------------------------------------------------------------------------------------------------  Chemistries   Recent Labs Lab 10/23/16 1004 10/24/16 1352  NA 129* 130*  K 3.6 3.3*  CL 96* 97*  CO2 24 25  GLUCOSE 131* 192*  BUN 16 14  CREATININE 0.60* 0.50*  CALCIUM 8.5* 8.5*  AST 159* 158*  ALT 171* 186*  ALKPHOS 925* 999*  BILITOT 5.6* 6.9*   ------------------------------------------------------------------------------------------------------------------ estimated creatinine clearance is 79.1 mL/min (by C-G formula based on SCr of 0.5 mg/dL (L)). ------------------------------------------------------------------------------------------------------------------ No results for input(s): TSH, T4TOTAL, T3FREE, THYROIDAB in the last 72 hours.  Invalid input(s): FREET3   Coagulation profile No results for input(s): INR, PROTIME in the last 168 hours. ------------------------------------------------------------------------------------------------------------------- No results for input(s): DDIMER in the last 72 hours. -------------------------------------------------------------------------------------------------------------------  Cardiac Enzymes No results for input(s): CKMB, TROPONINI, MYOGLOBIN in the last 168 hours.  Invalid input(s): CK ------------------------------------------------------------------------------------------------------------------ Invalid input(s): POCBNP  ---------------------------------------------------------------------------------------------------------------  Urinalysis No results found for: COLORURINE, APPEARANCEUR, LABSPEC, PHURINE, GLUCOSEU, HGBUR, BILIRUBINUR, KETONESUR, PROTEINUR, UROBILINOGEN, NITRITE, LEUKOCYTESUR   RADIOLOGY: Ct Abdomen W Contrast  Result Date: 10/23/2016 CLINICAL DATA:  Colorectal carcinoma with liver metastasis. Jaundice. Concern for obstruction. EXAM: CT ABDOMEN WITH CONTRAST TECHNIQUE:  Multidetector CT imaging  of the abdomen was performed using the standard protocol following bolus administration of intravenous contrast. CONTRAST:  121mL ISOVUE-300 IOPAMIDOL (ISOVUE-300) INJECTION 61% COMPARISON:  CT chest 10/15/2016, CT abdomen 09/2017 17 FINDINGS: Lower chest: RIGHT lower lobe and LEFT lower lobe pulmonary nodules again demonstrated. No short interval change . Hepatobiliary: Multiple round irregular hepatic metastasis again demonstrated. Lesions increased in size in short interval. For example posterior LEFT hepatic lobe lesion measuring 20 mm (image 32, series 2) compares to 13 mm. The lesion in the RIGHT hepatic lobe adjacent gallbladder fossa measures 4.5 cm compared to 3.8 cm. Lesion the posterior RIGHT hepatic lobe measures 3.6 cm compared to 2.5 cm. There is mild intrahepatic duct dilatation which is increased compared to prior. Duct dilatation is within the LEFT and RIGHT hepatic lobe but more prominent on the LEFT. There is no dilatation of the common hepatic duct or common bile duct. The gallbladder is thick-walled with peri cholecystic fluid inflammation. This inflammation seen on sagittal image 24, series 5. Pancreas: No pancreatic lesion.  No pancreatic duct dilatation Spleen: Normal spleen Adrenals/Urinary Tract: Adrenal glands kidneys are unchanged. Low-density lesions within the RIGHT kidney are stable. Stomach/Bowel: Stomach limited view of the bowel is unremarkable. Vascular/Lymphatic: Abdominal aorta is normal caliber. There is retroperitoneal lymphadenopathy. For example lymph node positioned between the IVC and aorta measures 16 mm short axis increased from 12 mm. There is loss of fat planes around this lymph node. Lymph node the porta hepatis is low-density consistent necrosis measuring 17 mm similar to 17 mm. Other: No peritoneal disease.  No aggressive osseous lesion. Musculoskeletal: No aggressive osseous IMPRESSION: 1. Increase in intra hepatic biliary duct dilatation.  Increased inflammation around the gallbladder. Findings are concerning for biliary obstruction at the level of porta hepatis. Most likely etiology would be metastatic adenopathy in the porta hepatis constricting the extrahepatic bile duct. Consider ERCP with bile duct stenting. 2. Common bile duct is normal caliber through the pancreatic head. No pancreatic inflammation. 3. Interval increase in size of hepatic metastasis. 4. Interval increase in retroperitoneal adenopathy. 5. Stable pulmonary metastasis. Findings conveyed toMELISSA CORCORAN on 10/23/2016  at15:21. Electronically Signed   By: Suzy Bouchard M.D.   On: 10/23/2016 15:22    EKG: Orders placed or performed during the hospital encounter of 10/11/16  . EKG 12-Lead  . EKG 12-Lead    IMPRESSION AND PLAN: Patient is a 65 year old Spanish-speaking male with obstructive jaundice 1. Obstructive jaundice suspect due to metastatic colorectal cancer We will make patient nothing by mouth past midnight for ERCP tomorrow I will also hold his Lipitor due to elevated LFTs   2. Diabetes type 2 I will continue his metformin hold his Glucotrol Place him on sliding scale insulin  3. Essential hypertension continue Vasotec  4. GERD we'll continue Prilosec as taking at home   5. Hypokalemia I will give him potassium  6. Colorectal cancer outpatient follow-up with oncology for further consideration of chemotherapy      l the records are reviewed and case discussed with ED provider. Management plans discussed with the patient, family and they are in agreement.  CODE STATUS:    Code Status Orders        Start     Ordered   10/24/16 1340  Full code  Continuous     10/24/16 1339    Code Status History    Date Active Date Inactive Code Status Order ID Comments User Context   This patient has a current code status  but no historical code status.       TOTAL TIME TAKING CARE OF THIS PATIENT: 42minutes.    Dustin Flock M.D  on 10/24/2016 at 3:33 PM  Between 7am to 6pm - Pager - [phone]  After 6pm go to [email or web address] - password [email or web address] [facility]  Office  [phone]  CC: Primary care physician; Baltazar Apo, MD

## 2016-10-25 ENCOUNTER — Inpatient Hospital Stay: Payer: 59

## 2016-10-25 ENCOUNTER — Encounter: Payer: Self-pay | Admitting: Gastroenterology

## 2016-10-25 ENCOUNTER — Inpatient Hospital Stay: Payer: 59 | Admitting: Anesthesiology

## 2016-10-25 ENCOUNTER — Encounter
Admission: AD | Disposition: A | Discharge status: Home / Self Care | Payer: Self-pay | Source: Ambulatory Visit | Attending: Internal Medicine

## 2016-10-25 DIAGNOSIS — E43 Unspecified severe protein-calorie malnutrition: Secondary | ICD-10-CM | POA: Insufficient documentation

## 2016-10-25 HISTORY — PX: ERCP: SHX5425

## 2016-10-25 LAB — POCT CBG MONITORING: POCT GLUCOSE (MANUAL ENTRY) KUC: 105 mg/dL — AB (ref 70–99)

## 2016-10-25 LAB — GLUCOSE, CAPILLARY
GLUCOSE-CAPILLARY: 121 mg/dL — AB (ref 65–99)
GLUCOSE-CAPILLARY: 86 mg/dL (ref 65–99)
Glucose-Capillary: 71 mg/dL (ref 65–99)
Glucose-Capillary: 134 mg/dL — ABNORMAL HIGH (ref 65–99)

## 2016-10-25 LAB — CBC
HCT: 31.7 % — ABNORMAL LOW (ref 40.0–52.0)
Hemoglobin: 11 g/dL — ABNORMAL LOW (ref 13.0–18.0)
MCH: 30.8 pg (ref 26.0–34.0)
MCHC: 34.6 g/dL (ref 32.0–36.0)
MCV: 89.1 fL (ref 80.0–100.0)
PLATELETS: 146 10*3/uL — AB (ref 150–440)
RBC: 3.56 MIL/uL — ABNORMAL LOW (ref 4.40–5.90)
RDW: 14.5 % (ref 11.5–14.5)
WBC: 9.6 10*3/uL (ref 3.8–10.6)

## 2016-10-25 LAB — BASIC METABOLIC PANEL
ANION GAP: 8 (ref 5–15)
BUN: 11 mg/dL (ref 6–20)
CALCIUM: 8.1 mg/dL — AB (ref 8.9–10.3)
CO2: 24 mmol/L (ref 22–32)
CREATININE: 0.53 mg/dL — AB (ref 0.61–1.24)
Chloride: 99 mmol/L — ABNORMAL LOW (ref 101–111)
GLUCOSE: 72 mg/dL (ref 65–99)
Potassium: 3.7 mmol/L (ref 3.5–5.1)
Sodium: 131 mmol/L — ABNORMAL LOW (ref 135–145)

## 2016-10-25 SURGERY — ERCP, WITH INTERVENTION IF INDICATED
Anesthesia: General

## 2016-10-25 MED ORDER — MIDAZOLAM HCL 5 MG/5ML IJ SOLN: INTRAMUSCULAR | Status: DC | PRN

## 2016-10-25 MED ORDER — PROPOFOL 10 MG/ML IV BOLUS
INTRAVENOUS | Status: DC | PRN
  Administered 2016-10-25: 80 mg via INTRAVENOUS

## 2016-10-25 MED ORDER — ROCURONIUM BROMIDE 100 MG/10ML IV SOLN
INTRAVENOUS | Status: DC | PRN
  Administered 2016-10-25: 10 mg via INTRAVENOUS

## 2016-10-25 MED ORDER — FENTANYL CITRATE (PF) 100 MCG/2ML IJ SOLN: 25.0000 ug | INTRAMUSCULAR | Status: DC | PRN

## 2016-10-25 MED ORDER — INSULIN ASPART 100 UNIT/ML ~~LOC~~ SOLN: 0.0000 [IU] | Freq: Every day | SUBCUTANEOUS | Status: DC

## 2016-10-25 MED ORDER — ONDANSETRON HCL 4 MG/2ML IJ SOLN: 4.0000 mg | Freq: Once | INTRAMUSCULAR | Status: DC | PRN

## 2016-10-25 MED ORDER — ONDANSETRON HCL 4 MG/2ML IJ SOLN
INTRAMUSCULAR | Status: DC | PRN
  Administered 2016-10-25: 4 mg via INTRAVENOUS

## 2016-10-25 MED ORDER — FENTANYL CITRATE (PF) 100 MCG/2ML IJ SOLN
INTRAMUSCULAR | Status: DC | PRN
  Administered 2016-10-25: 50 ug via INTRAVENOUS

## 2016-10-25 MED ORDER — DEXTROSE 50 % IV SOLN
25.0000 mL | Freq: Once | INTRAVENOUS | Status: AC
  Administered 2016-10-25: 09:00:00 25 mL via INTRAVENOUS
  Filled 2016-10-25: qty 50

## 2016-10-25 MED ORDER — INDOMETHACIN 50 MG RE SUPP
50.0000 mg | Freq: Once | RECTAL | Status: AC
  Administered 2016-10-25: 50 mg via RECTAL
  Filled 2016-10-25: qty 1

## 2016-10-25 MED ORDER — INSULIN ASPART 100 UNIT/ML ~~LOC~~ SOLN: 0.0000 [IU] | Freq: Three times a day (TID) | SUBCUTANEOUS | Status: DC

## 2016-10-25 MED ORDER — PHENYLEPHRINE HCL 10 MG/ML IJ SOLN
INTRAMUSCULAR | Status: DC | PRN
  Administered 2016-10-25: 100 ug via INTRAVENOUS

## 2016-10-25 MED ORDER — LIDOCAINE HCL (CARDIAC) 20 MG/ML IV SOLN
INTRAVENOUS | Status: DC | PRN
  Administered 2016-10-25: 60 mg via INTRAVENOUS

## 2016-10-25 MED ORDER — MIDAZOLAM HCL 5 MG/5ML IJ SOLN
INTRAMUSCULAR | Status: DC | PRN
  Administered 2016-10-25: 2 mg via INTRAVENOUS

## 2016-10-25 MED ORDER — HYDROCORTISONE 1 % EX CREA
1.0000 "application " | TOPICAL_CREAM | Freq: Four times a day (QID) | CUTANEOUS | Status: DC | PRN
  Filled 2016-10-25: qty 28

## 2016-10-25 MED ORDER — SUGAMMADEX SODIUM 200 MG/2ML IV SOLN
INTRAVENOUS | Status: DC | PRN
  Administered 2016-10-25: 120 mg via INTRAVENOUS

## 2016-10-25 MED ORDER — SUCCINYLCHOLINE CHLORIDE 20 MG/ML IJ SOLN
INTRAMUSCULAR | Status: DC | PRN
  Administered 2016-10-25: 80 mg via INTRAVENOUS

## 2016-10-25 NOTE — Anesthesia Procedure Notes (Signed)
Procedure Name: Intubation Date/Time: 10/25/2016 11:47 AM Performed by: Dionne Bucy Pre-anesthesia Checklist: Patient identified, Patient being monitored, Timeout performed, Emergency Drugs available and Suction available Patient Re-evaluated:Patient Re-evaluated prior to inductionOxygen Delivery Method: Circle system utilized Preoxygenation: Pre-oxygenation with 100% oxygen Intubation Type: IV induction Ventilation: Mask ventilation without difficulty Laryngoscope Size: Mac and 4 Grade View: Grade II Tube type: Oral Tube size: 7.0 mm Number of attempts: 1 Airway Equipment and Method: Stylet Placement Confirmation: ETT inserted through vocal cords under direct vision,  positive ETCO2 and breath sounds checked- equal and bilateral Secured at: 22 cm Tube secured with: Tape Dental Injury: Teeth and Oropharynx as per pre-operative assessment

## 2016-10-25 NOTE — Op Note (Signed)
[facility] Gastroenterology Patient Name: Nathan Atkinson Procedure Date: 10/25/2016 11:58 AM MRN: [id number] Account #: [id number] Date of Birth: 1951/01/07 Admit Type: Inpatient Age: 65 Room: [facility] ENDO ROOM 4 Gender: Male Note Status: Finalized Procedure:            ERCP Indications:          Jaundice Providers:            Lucilla Lame MD, MD Referring MD:         Drue Second. Mike Gip (Referring MD) Medicines:            General Anesthesia Complications:        No immediate complications. Procedure:            Pre-Anesthesia Assessment:                       - Prior to the procedure, a History and Physical was                        performed, and patient medications and allergies were                        reviewed. The patient's tolerance of previous                        anesthesia was also reviewed. The risks and benefits of                        the procedure and the sedation options and risks were                        discussed with the patient. All questions were                        answered, and informed consent was obtained. Prior                        Anticoagulants: The patient has taken no previous                        anticoagulant or antiplatelet agents. ASA Grade                        Assessment: II - A patient with mild systemic disease.                        After reviewing the risks and benefits, the patient was                        deemed in satisfactory condition to undergo the                        procedure.                       After obtaining informed consent, the scope was passed                        under direct vision. Throughout the procedure, the  patient's blood pressure, pulse, and oxygen saturations                        were monitored continuously. The Endosonoscope was                        introduced through the mouth, and used to inject                        contrast into  and used to inject contrast into the bile                        duct. The ERCP was accomplished with ease. The patient                        tolerated the procedure well. Findings:      The scout film was normal. The esophagus was successfully intubated       under direct vision. The scope was advanced to a normal major papilla in       the descending duodenum without detailed examination of the pharynx,       larynx and associated structures, and upper GI tract. The upper GI tract       was grossly normal. The bile duct was deeply cannulated with the       short-nosed traction sphincterotome. Contrast was injected. I personally       interpreted the bile duct images. There was brisk flow of contrast       through the ducts. Image quality was excellent. Contrast extended to the       entire biliary tree. The common hepatic duct and hepatic duct       bifurcation contained a single segmental stenosis 15 mm in length. The       entire biliary tree was diffusely dilated, secondary to a stricture. A       wire was passed into the biliary tree. Biliary sphincterotomy was made       with a traction (standard) sphincterotome using ERBE electrocautery.       There was no post-sphincterotomy bleeding. One 7 Fr by 12 cm plastic       stent with a single external flap and a single internal flap was placed       10 cm into the common bile duct. Bile flowed through the stent. The       stent was in good position. 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                       - The entire biliary tree was dilated, secondary to a                        stricture.                       - A biliary sphincterotomy was performed.                       - One plastic stent was placed into the common bile                        duct. Recommendation:       -  Watch for pancreatitis, bleeding, perforation, and                        cholangitis. Procedure Code(s):    --- Professional  ---                       [phone], Endoscopic retrograde cholangiopancreatography                        (ERCP); with placement of endoscopic stent into biliary                        or pancreatic duct, including pre- and post-dilation                        and guide wire passage, when performed, including                        sphincterotomy, when performed, each stent                       [id number], Endoscopic catheterization of the biliary ductal                        system, radiological supervision and interpretation Diagnosis Code(s):    --- Professional ---                       R17, Unspecified jaundice                       K83.1, Obstruction of bile duct CPT copyright 2016 American Medical Association. All rights reserved. The codes documented in this report are preliminary and upon coder review may  be revised to meet current compliance requirements. Lucilla Lame MD, MD 10/25/2016 12:34:38 PM This report has been signed electronically. Number of Addenda: 0 Note Initiated On: 10/25/2016 11:58 AM      [facility]

## 2016-10-25 NOTE — Transfer of Care (Signed)
Immediate Anesthesia Transfer of Care Note  Patient: Nathan Atkinson  Procedure(s) Performed: Procedure(s): ENDOSCOPIC RETROGRADE CHOLANGIOPANCREATOGRAPHY (ERCP) (N/A)  Patient Location: PACU  Anesthesia Type:General  Level of Consciousness: sedated  Airway & Oxygen Therapy: Patient Spontanous Breathing and Patient connected to face mask oxygen  Post-op Assessment: Report given to RN and Post -op Vital signs reviewed and stable  Post vital signs: Reviewed and stable  Last Vitals:  Vitals:   10/25/16 1107 10/25/16 1244  BP: 115/68 121/64  Pulse: 85 74  Resp: 17 18  Temp: 37.3 C 37 C    Last Pain:  Vitals:   10/25/16 1107  TempSrc: Tympanic  PainSc:          Complications: No apparent anesthesia complications

## 2016-10-25 NOTE — Anesthesia Preprocedure Evaluation (Signed)
Anesthesia Evaluation  Patient identified by MRN, date of birth, ID band Patient awake    Reviewed: Allergy & Precautions, H&P , NPO status , Patient's Chart, lab work & pertinent test results, reviewed documented beta blocker date and time   Airway Mallampati: II  TM Distance: >3 FB Neck ROM: full    Dental  (+) Teeth Intact   Pulmonary neg pulmonary ROS, former smoker,    Pulmonary exam normal        Cardiovascular hypertension, negative cardio ROS Normal cardiovascular exam Rhythm:regular Rate:Normal     Neuro/Psych negative neurological ROS  negative psych ROS   GI/Hepatic negative GI ROS, Neg liver ROS,   Endo/Other  negative endocrine ROSdiabetes  Renal/GU negative Renal ROS  negative genitourinary   Musculoskeletal   Abdominal   Peds  Hematology negative hematology ROS (+)   Anesthesia Other Findings Past Medical History: 08/2015: Cataract     Comment: forming 10/08/2016: Colorectal cancer ([place]) No date: Diabetes mellitus without complication ([place]) No date: Hyperlipidemia No date: Hypertension No date: Platelets decreased ([place]) Past Surgical History: 2003: CARDIAC CATHETERIZATION 10/04/2016: COLONOSCOPY N/A     Comment: Procedure: COLONOSCOPY;  Surgeon: Lollie Sails, MD;  Location: [facility] ENDOSCOPY;                Service: Endoscopy;  Laterality: N/A; 10/04/2016: ESOPHAGOGASTRODUODENOSCOPY (EGD) WITH PROPOFOL N/A     Comment: Procedure: ESOPHAGOGASTRODUODENOSCOPY (EGD)               WITH PROPOFOL;  Surgeon: Lollie Sails, MD;              Location: [facility] ENDOSCOPY;  Service: Endoscopy;               Laterality: N/A; 10/12/2016: PORTACATH PLACEMENT Left     Comment: Procedure: INSERTION PORT-A-CATH;  Surgeon:               Olean Ree, MD;  Location: [facility] ORS;                Service: General;  Laterality: Left; BMI    Body Mass Index:  22.95 kg/m      Reproductive/Obstetrics negative OB ROS                             Anesthesia Physical Anesthesia Plan  ASA: II and emergent  Anesthesia Plan: General ETT   Post-op Pain Management:    Induction:   Airway Management Planned:   Additional Equipment:   Intra-op Plan:   Post-operative Plan:   Informed Consent: I have reviewed the patients History and Physical, chart, labs and discussed the procedure including the risks, benefits and alternatives for the proposed anesthesia with the patient or authorized representative who has indicated his/her understanding and acceptance.   Dental Advisory Given  Plan Discussed with: CRNA  Anesthesia Plan Comments:         Anesthesia Quick Evaluation

## 2016-10-25 NOTE — Progress Notes (Signed)
Initial Nutrition Assessment  DOCUMENTATION CODES:   Severe malnutrition in context of acute illness/injury  INTERVENTION:  Will order Ensure Enlive po BID when diet advanced after procedure, each supplement provides 350 kcal and 20 grams of protein.  Encouraged adequate intake of calories and protein with meals, snacks, and beverages. Explained the role of adequate intake in preventing muscle loss so patient can stay strong for chemotherapy.  NUTRITION DIAGNOSIS:   Increased nutrient needs related to catabolic illness, cancer and cancer related treatments as evidenced by estimated needs.  GOAL:   Patient will meet greater than or equal to 90% of their needs  MONITOR:   PO intake, Supplement acceptance, Labs, Weight trends, Diet advancement, I & O's  REASON FOR ASSESSMENT:   Consult Poor PO  ASSESSMENT:   65 y.o. male with a known history of  metastatic colorectal cancer (mets to liver and lung), diabetes, hyperlipidemia and hypertension who went for a follow up with his oncologist yesterday. Patient had blood work checked and he was also complaining of being jaundiced. CT scan of the abdomen was done which reveals increase in intrahepatic biliary duct dilation. There was increase inflammation around the gallbladder. Findings were concerning for biliary obstruction at the level of porta hepatis. The oncologist discussed the case with Dr. Allen Norris who will be planning to do an ERCP with stent tomorrow. Patient complains of pain in the right upper quadrant and epigastric region described it as sharp. An intermittent ache in nature. He also has some nausea but no vomiting or diarrhea.   -Plan was to start cycle #1 of FOLFOX chemotherapy, but was postponed due to concern for obstruction.  Patient is Spanish-speaking. Spoke with patient and son at bedside (son was able to translate). Patient reports his appetite has been poor for the past month and he has been eating less than usual.  Appetite is poor related to mass in colon. It is causing him to feel like he needs to have frequent bowel movements, so he has been eating less than usual in an attempt to lessen the bowel movements. He reports he has been having 2-3 meals per day but only small portions (eating 50% less than usual intake).   UBW is 148 lbs and reports he has been losing weight over the past month. Patient has lost 14 lbs (10% body weight) over 1 month, which is significant for time frame.  Discussed importance of adequate protein and calorie intake to remain strong and prevent loss of muscle mass in setting of upcoming chemotherapy that has been postponed. Reviewed protein foods with patient and encouraged their intake at each meal. Patient and son also amenable to drinking Ensure Enlive daily to provide more calories and protein.  Medications reviewed and include: Novolog sliding scale TID with meals, metformin 1000 mg BID with meals, pantoprazole, potassium chloride 40 mEq daily, NS @ 100 ml/hr, Morphine PRN.  Labs reviewed: CBG 71-166 since admission, Sodium 131, Chloride 99, Creatinine 0.53, HgbA1c 8 on 08/22/2016.  Nutrition-Focused physical exam completed. Findings are no fat depletion, no muscle depletion, and no edema. Although no wasting noted on exam, patient reports he is feeling weaker than usual.  Patient meets criteria for severe acute malnutrition in setting of 10% weight loss over 1 month, intake </=50% of estimated energy requirement for >/= 5 days.  Discussed with RN. Will go for ERCP later today.  Diet Order:  Diet NPO time specified  Skin:  Reviewed, no issues  Last BM:  10/24/2016  Height:  Ht Readings from Last 1 Encounters:  10/24/16 5\' 4"  (1.626 m)    Weight:   Wt Readings from Last 1 Encounters:  10/24/16 133 lb 11.2 oz (60.6 kg)    Ideal Body Weight:  59.1 kg  BMI:  Body mass index is 22.95 kg/m.  Estimated Nutritional Needs:   Kcal:  1800-2100 (30-35  kcal/kg)  Protein:  80-95 grams (1.3-1.6 grams/kg)  Fluid:  >/= 1.8 L/day  EDUCATION NEEDS:   Education needs addressed (Importance of adequate calories and protein.)  Willey Blade, MS, RD, LDN Pager: [phone] After Hours Pager: [phone]

## 2016-10-25 NOTE — OR Nursing (Signed)
11:44 Patient is being intubated by  Anesthesia staff.

## 2016-10-25 NOTE — Progress Notes (Addendum)
[place] at [place] NAME: Nathan Atkinson    MR#:  [id number]  DATE OF BIRTH:  07-17-1951  SUBJECTIVE:   Some abdominal pain and jaundice  REVIEW OF SYSTEMS:    Review of Systems  Constitutional: Negative.  Negative for chills, fever and malaise/fatigue.  HENT: Negative.  Negative for ear discharge, ear pain, hearing loss, nosebleeds and sore throat.   Eyes: Negative.  Negative for blurred vision and pain.  Respiratory: Negative.  Negative for cough, hemoptysis, shortness of breath and wheezing.   Cardiovascular: Negative.  Negative for chest pain, palpitations and leg swelling.  Gastrointestinal: Positive for abdominal pain. Negative for blood in stool, diarrhea, nausea and vomiting.  Genitourinary: Negative.  Negative for dysuria.  Musculoskeletal: Negative.  Negative for back pain.  Skin: Negative.   Neurological: Negative for dizziness, tremors, speech change, focal weakness, seizures and headaches.  Endo/Heme/Allergies: Negative.  Does not bruise/bleed easily.  Psychiatric/Behavioral: Negative.  Negative for depression, hallucinations and suicidal ideas.    Tolerating Diet: NPO      DRUG ALLERGIES:  No Known Allergies  VITALS:  Blood pressure 126/64, pulse 87, temperature 98.6 F (37 C), resp. rate 18, height 5\' 4"  (1.626 m), weight 60.6 kg (133 lb 11.2 oz), SpO2 97 %.  PHYSICAL EXAMINATION:   Physical Exam  Constitutional: He is oriented to person, place, and time and well-developed, well-nourished, and in no distress. No distress.  HENT:  Head: Normocephalic.  Eyes: No scleral icterus.  Icteric   Neck: Normal range of motion. Neck supple. No JVD present. No tracheal deviation present.  Cardiovascular: Normal rate, regular rhythm and normal heart sounds.  Exam reveals no gallop and no friction rub.   No murmur heard. Pulmonary/Chest: Effort normal and breath sounds normal. No respiratory distress. He has no  wheezes. He has no rales. He exhibits no tenderness.  Abdominal: Soft. Bowel sounds are normal. He exhibits mass. He exhibits no distension. There is tenderness. There is no rebound and no guarding.  Musculoskeletal: Normal range of motion. He exhibits no edema.  Neurological: He is alert and oriented to person, place, and time.  Skin: Skin is warm. No rash noted. No erythema.  jaundice  Psychiatric: Affect and judgment normal.      LABORATORY PANEL:   CBC  Recent Labs Lab 10/25/16 0504  WBC 9.6  HGB 11.0*  HCT 31.7*  PLT 146*   ------------------------------------------------------------------------------------------------------------------  Chemistries   Recent Labs Lab 10/24/16 1352 10/25/16 0504  NA 130* 131*  K 3.3* 3.7  CL 97* 99*  CO2 25 24  GLUCOSE 192* 72  BUN 14 11  CREATININE 0.50* 0.53*  CALCIUM 8.5* 8.1*  AST 158*  --   ALT 186*  --   ALKPHOS 999*  --   BILITOT 6.9*  --    ------------------------------------------------------------------------------------------------------------------  Cardiac Enzymes No results for input(s): TROPONINI in the last 168 hours. ------------------------------------------------------------------------------------------------------------------  RADIOLOGY:  Ct Abdomen W Contrast  Result Date: 10/23/2016 CLINICAL DATA:  Colorectal carcinoma with liver metastasis. Jaundice. Concern for obstruction. EXAM: CT ABDOMEN WITH CONTRAST TECHNIQUE: Multidetector CT imaging of the abdomen was performed using the standard protocol following bolus administration of intravenous contrast. CONTRAST:  156mL ISOVUE-300 IOPAMIDOL (ISOVUE-300) INJECTION 61% COMPARISON:  CT chest 10/15/2016, CT abdomen 09/2017 17 FINDINGS: Lower chest: RIGHT lower lobe and LEFT lower lobe pulmonary nodules again demonstrated. No short interval change . Hepatobiliary: Multiple round irregular hepatic metastasis again demonstrated. Lesions increased in size in  short interval. For example posterior LEFT hepatic lobe lesion measuring 20 mm (image 32, series 2) compares to 13 mm. The lesion in the RIGHT hepatic lobe adjacent gallbladder fossa measures 4.5 cm compared to 3.8 cm. Lesion the posterior RIGHT hepatic lobe measures 3.6 cm compared to 2.5 cm. There is mild intrahepatic duct dilatation which is increased compared to prior. Duct dilatation is within the LEFT and RIGHT hepatic lobe but more prominent on the LEFT. There is no dilatation of the common hepatic duct or common bile duct. The gallbladder is thick-walled with peri cholecystic fluid inflammation. This inflammation seen on sagittal image 24, series 5. Pancreas: No pancreatic lesion.  No pancreatic duct dilatation Spleen: Normal spleen Adrenals/Urinary Tract: Adrenal glands kidneys are unchanged. Low-density lesions within the RIGHT kidney are stable. Stomach/Bowel: Stomach limited view of the bowel is unremarkable. Vascular/Lymphatic: Abdominal aorta is normal caliber. There is retroperitoneal lymphadenopathy. For example lymph node positioned between the IVC and aorta measures 16 mm short axis increased from 12 mm. There is loss of fat planes around this lymph node. Lymph node the porta hepatis is low-density consistent necrosis measuring 17 mm similar to 17 mm. Other: No peritoneal disease.  No aggressive osseous lesion. Musculoskeletal: No aggressive osseous IMPRESSION: 1. Increase in intra hepatic biliary duct dilatation. Increased inflammation around the gallbladder. Findings are concerning for biliary obstruction at the level of porta hepatis. Most likely etiology would be metastatic adenopathy in the porta hepatis constricting the extrahepatic bile duct. Consider ERCP with bile duct stenting. 2. Common bile duct is normal caliber through the pancreatic head. No pancreatic inflammation. 3. Interval increase in size of hepatic metastasis. 4. Interval increase in retroperitoneal adenopathy. 5. Stable  pulmonary metastasis. Findings conveyed toMELISSA CORCORAN on 10/23/2016  at15:21. Electronically Signed   By: Suzy Bouchard M.D.   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  1. Obstructive jaundice due to metastatic adenopathy in the porta hepatis constricting the extrahepatic bile duct Plan for ERCP today  2. Metastatic colorectal cancer: Patient will follow-up with Dr. Mike Gip to start chemotherapy.  3 diabetes: Stop metformin for now Continue sliding scale insulin Appreciate diabetes consult  4. Hyponatremia: Likely poor po intake/cancer Monitor for am   5. Severe malnutrition in context of acute illness/injury: dietary consult   Management plans discussed with the patient and he is in agreement.  CODE STATUS: full  TOTAL TIME TAKING CARE OF THIS PATIENT: 30 minutes.   Rounded with nursing staff  Spanish interpreter in room POSSIBLE D/C tomorrow, DEPENDING ON CLINICAL CONDITION.   Jovontae Banko M.D on 10/25/2016 at 10:17 AM  Between 7am to 6pm - Pager - [phone] After 6pm go to [email or web address] - password [email or web address] [facility]  Office  [phone]  CC: Primary care physician; Baltazar Apo, MD  Note: This dictation was prepared with Dragon dictation along with smaller phrase technology. Any transcriptional errors that result from this process are unintentional.

## 2016-10-25 NOTE — Progress Notes (Signed)
Inpatient Diabetes Program Recommendations  AACE/ADA: New Consensus Statement on Inpatient Glycemic Control (2015)  Target Ranges:  Prepandial:   less than 140 mg/dL      Peak postprandial:   less than 180 mg/dL (1-2 hours)      Critically ill patients:  140 - 180 mg/dL   Results for BREYTON, SEPANSKI (MRN [id number]) as of 10/25/2016 08:24  Ref. Range 10/24/2016 17:28 10/24/2016 22:05 10/25/2016 07:28  Glucose-Capillary Latest Ref Range: 65 - 99 mg/dL 166 (H) 85 71   Review of Glycemic Control  Diabetes history: DM2 Outpatient Diabetes medications: Glipizide 5 mg QPM, Metformin 1000 mg BID Current orders for Inpatient glycemic control: Metformin 1000 mg BID, Novolog 0-9 units TID with meals  Inpatient Diabetes Program Recommendations: Correction (SSI): Noted patient is NPO for procedure today. If patient remains NPO please change frequency of CBGs and Novolog to Q4H but if diet is resumed please add Novolog 0-5 units QHS correction scale. Oral Agents: While inpatient, please consider discontinuing Metformin.  Thanks, Barnie Alderman, RN, MSN, CDE Diabetes Coordinator Inpatient Diabetes Program [phone] (Team Pager from 8am to 5pm)

## 2016-10-25 NOTE — Anesthesia Postprocedure Evaluation (Signed)
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  Procedure(s) Performed: Procedure(s) (LRB): ENDOSCOPIC RETROGRADE CHOLANGIOPANCREATOGRAPHY (ERCP) (N/A)  Patient location during evaluation: PACU Anesthesia Type: General Level of consciousness: awake and alert Pain management: pain level controlled Vital Signs Assessment: post-procedure vital signs reviewed and stable Respiratory status: spontaneous breathing, nonlabored ventilation, respiratory function stable and patient connected to nasal cannula oxygen Cardiovascular status: blood pressure returned to baseline and stable Postop Assessment: no signs of nausea or vomiting Anesthetic complications: no    Last Vitals:  Vitals:   10/25/16 1329 10/25/16 1401  BP: 123/68 118/67  Pulse: 75 72  Resp: 16 18  Temp: 36.8 C 36.9 C    Last Pain:  Vitals:   10/25/16 1401  TempSrc: Oral  PainSc:                  Molli Barrows

## 2016-10-25 NOTE — Progress Notes (Signed)
Pt transported to Endo with son at bedside.

## 2016-10-26 ENCOUNTER — Inpatient Hospital Stay: Payer: 59

## 2016-10-26 DIAGNOSIS — Z7984 Long term (current) use of oral hypoglycemic drugs: Secondary | ICD-10-CM

## 2016-10-26 DIAGNOSIS — E119 Type 2 diabetes mellitus without complications: Secondary | ICD-10-CM

## 2016-10-26 DIAGNOSIS — C187 Malignant neoplasm of sigmoid colon: Secondary | ICD-10-CM

## 2016-10-26 DIAGNOSIS — R748 Abnormal levels of other serum enzymes: Secondary | ICD-10-CM

## 2016-10-26 DIAGNOSIS — D696 Thrombocytopenia, unspecified: Secondary | ICD-10-CM

## 2016-10-26 DIAGNOSIS — Z87891 Personal history of nicotine dependence: Secondary | ICD-10-CM

## 2016-10-26 DIAGNOSIS — I10 Essential (primary) hypertension: Secondary | ICD-10-CM

## 2016-10-26 DIAGNOSIS — R918 Other nonspecific abnormal finding of lung field: Secondary | ICD-10-CM

## 2016-10-26 DIAGNOSIS — C787 Secondary malignant neoplasm of liver and intrahepatic bile duct: Secondary | ICD-10-CM

## 2016-10-26 DIAGNOSIS — E785 Hyperlipidemia, unspecified: Secondary | ICD-10-CM

## 2016-10-26 DIAGNOSIS — K828 Other specified diseases of gallbladder: Secondary | ICD-10-CM

## 2016-10-26 DIAGNOSIS — Z79899 Other long term (current) drug therapy: Secondary | ICD-10-CM

## 2016-10-26 LAB — COMPREHENSIVE METABOLIC PANEL
ALT: 209 U/L — ABNORMAL HIGH (ref 17–63)
AST: 185 U/L — AB (ref 15–41)
Albumin: 2.2 g/dL — ABNORMAL LOW (ref 3.5–5.0)
Alkaline Phosphatase: 1054 U/L — ABNORMAL HIGH (ref 38–126)
Anion gap: 7 (ref 5–15)
BUN: 10 mg/dL (ref 6–20)
CHLORIDE: 102 mmol/L (ref 101–111)
CO2: 25 mmol/L (ref 22–32)
Calcium: 8 mg/dL — ABNORMAL LOW (ref 8.9–10.3)
Creatinine, Ser: 0.49 mg/dL — ABNORMAL LOW (ref 0.61–1.24)
Glucose, Bld: 107 mg/dL — ABNORMAL HIGH (ref 65–99)
POTASSIUM: 3.8 mmol/L (ref 3.5–5.1)
Sodium: 134 mmol/L — ABNORMAL LOW (ref 135–145)
Total Bilirubin: 8.8 mg/dL — ABNORMAL HIGH (ref 0.3–1.2)
Total Protein: 6.7 g/dL (ref 6.5–8.1)

## 2016-10-26 LAB — GLUCOSE, CAPILLARY
GLUCOSE-CAPILLARY: 183 mg/dL — AB (ref 65–99)
GLUCOSE-CAPILLARY: 157 mg/dL — AB (ref 65–99)
Glucose-Capillary: 105 mg/dL — ABNORMAL HIGH (ref 65–99)
Glucose-Capillary: 100 mg/dL — ABNORMAL HIGH (ref 65–99)
Glucose-Capillary: 111 mg/dL — ABNORMAL HIGH (ref 65–99)

## 2016-10-26 LAB — LIPASE, BLOOD: LIPASE: 19 U/L (ref 11–51)

## 2016-10-26 MED ORDER — ENSURE ENLIVE PO LIQD
237.0000 mL | Freq: Two times a day (BID) | ORAL | Status: DC
  Administered 2016-10-27: 237 mL via ORAL

## 2016-10-26 MED ORDER — OXYCODONE HCL 5 MG PO TABS
5.0000 mg | ORAL_TABLET | ORAL | Status: DC | PRN
  Administered 2016-10-26 – 2016-10-27 (×2): 10 mg via ORAL
  Filled 2016-10-26: qty 1
  Filled 2016-10-26 (×2): qty 2

## 2016-10-26 NOTE — Progress Notes (Signed)
Pharmacy Antibiotic Note  Nathan Atkinson is a 65 y.o. male admitted on 10/24/2016 with Intra-abdominal Infection.  Pharmacy has been consulted for Zosyn dosing.  Plan: Will start the patient on Zosyn 3.375 IV EI every 8 hours.   Height: 5\' 4"  (162.6 cm) Weight: 133 lb 11.2 oz (60.6 kg) IBW/kg (Calculated) : 59.2  Temp (24hrs), Avg:98.6 F (37 C), Min:98.3 F (36.8 C), Max:99.2 F (37.3 C)   Recent Labs Lab 10/23/16 1004 10/24/16 1352 10/25/16 0504 10/26/16 0522  WBC 10.1 8.6 9.6  --   CREATININE 0.60* 0.50* 0.53* 0.49*    Estimated Creatinine Clearance: 79.1 mL/min (by C-G formula based on SCr of 0.49 mg/dL (L)).    No Known Allergies  Antimicrobials this admission: 11/15 Zosyn >>  Dose adjustments this admission:  Microbiology results: BCx:  UCx:  Sputum:  MRSA PCR:   Thank you for allowing pharmacy to be a part of this patient's care.  Cheri Guppy, PharmD Clinical Pharmacist  10/26/2016 10:15 AM

## 2016-10-26 NOTE — Progress Notes (Signed)
[facility] at [place] NAME: Nathan Atkinson    MR#:  [id number]  DATE OF BIRTH:  1951/10/08  SUBJECTIVE:  CHIEF COMPLAINT:  No chief complaint on file.  - spanish speaking patient- used an interpreter for communication - metastatic colon cancer new diagnosis with biliary obstruction - complains of abdominal pain, s/p ERCP yesterday and biliary stent placed but still elevated bili  REVIEW OF SYSTEMS:  Review of Systems  Constitutional: Positive for malaise/fatigue. Negative for chills and fever.  HENT: Negative for ear discharge, ear pain and hearing loss.   Eyes: Negative for blurred vision and double vision.  Respiratory: Negative for cough, shortness of breath and wheezing.   Cardiovascular: Negative for chest pain, palpitations and leg swelling.  Gastrointestinal: Positive for abdominal pain. Negative for constipation, diarrhea, nausea and vomiting.  Genitourinary: Negative for dysuria and urgency.  Musculoskeletal: Negative for myalgias.  Neurological: Negative for dizziness, sensory change, speech change, seizures and headaches.  Psychiatric/Behavioral: Negative for depression.    DRUG ALLERGIES:  No Known Allergies  VITALS:  Blood pressure 136/67, pulse 78, temperature 98.4 F (36.9 C), temperature source Oral, resp. rate 18, height 5\' 4"  (1.626 m), weight 60.6 kg (133 lb 11.2 oz), SpO2 97 %.  PHYSICAL EXAMINATION:  Physical Exam  GENERAL:  65 y.o.-year-old patient lying in the bed with no acute distress.  EYES: Pupils equal, round, reactive to light and accommodation. positive scleral icterus. Extraocular muscles intact.  HEENT: Head atraumatic, normocephalic. Oropharynx and nasopharynx clear.  NECK:  Supple, no jugular venous distention. No thyroid enlargement, no tenderness.  LUNGS: Normal breath sounds bilaterally, no wheezing, rales,rhonchi or crepitation. No use of accessory muscles of respiration.    CARDIOVASCULAR: S1, S2 normal. No murmurs, rubs, or gallops.  ABDOMEN: Soft, nontender, nondistended. Bowel sounds present. No organomegaly or mass.  EXTREMITIES: No pedal edema, cyanosis, or clubbing.  NEUROLOGIC: Cranial nerves II through XII are intact. Muscle strength 5/5 in all extremities. Sensation intact. Gait not checked.  PSYCHIATRIC: The patient is alert and oriented x 3.  SKIN: No obvious rash, lesion, or ulcer.    LABORATORY PANEL:   CBC  Recent Labs Lab 10/25/16 0504  WBC 9.6  HGB 11.0*  HCT 31.7*  PLT 146*   ------------------------------------------------------------------------------------------------------------------  Chemistries   Recent Labs Lab 10/26/16 0522  NA 134*  K 3.8  CL 102  CO2 25  GLUCOSE 107*  BUN 10  CREATININE 0.49*  CALCIUM 8.0*  AST 185*  ALT 209*  ALKPHOS 1,054*  BILITOT 8.8*   ------------------------------------------------------------------------------------------------------------------  Cardiac Enzymes No results for input(s): TROPONINI in the last 168 hours. ------------------------------------------------------------------------------------------------------------------  RADIOLOGY:  US Abdomen Limited Ruq  Result Date: 10/26/2016 CLINICAL DATA:  Metastatic colon carcinoma to the liver and status post placement of endoscopic biliary stent in the common bile duct to treat biliary obstruction. Evaluation of the liver and bile ducts performed due to rising bilirubin. EXAM: US ABDOMEN LIMITED - RIGHT UPPER QUADRANT COMPARISON:  CT of the abdomen on 10/23/2016 FINDINGS: Gallbladder: The gallbladder contains sludge. Common bile duct: Diameter: 4-7 mm without significant dilatation. Common bile duct stent is visible by ultrasound. Liver: Multiple masses are seen throughout the liver and the liver shows diffuse enlargement. There is no evidence of significant intrahepatic biliary ductal dilatation. IMPRESSION: No evidence by  ultrasound of progressive biliary obstruction status post endoscopic biliary stent placement. Percutaneous biliary drainage currently would not be possible given the decompressed appearance of bile ducts within  the liver parenchyma. Electronically Signed   By: Aletta Edouard M.D.   On: 10/26/2016 15:18    EKG:   Orders placed or performed during the hospital encounter of 10/11/16  . EKG 12-Lead  . EKG 12-Lead    ASSESSMENT AND PLAN:   65 year old male with past medical history significant for hypertension, diabetes who was just recently diagnosed with metastatic colon cancer presents to Hospital secondary to worsening jaundice.  #1 obstructive jaundice-secondary to metastatic adenopathy compressing the biliary ducts. -Appreciate GI consult. Status post ERCP and stent placement yesterday. -LFTs are still elevated with elevated bilirubin. Ultrasound done today to see if there are any dilated biliary ducts to see if percutaneous intervention to decompress the biliary tract is possible. -Ultrasound did not show any dilated biliary ducts. So plan is to watch until his bilirubin improves.  #2 metastatic colon cancer-just diagnosed 3 weeks ago. Had a Port-A-Cath placed in. -Metastases to liver and also lymph nodes in the abdomen and lungs noted. -Hasn't been started yet on chemotherapy because of his elevated liver tests. -Appreciate oncology consult  #3 GERD-Protonix  #4 diabetes mellitus-A1c is 8 from September 2017. On sliding scale insulin. Glipizide and metformin on hold as he is on full liquid diet  #5 DVT prophylaxis- on lovenox   Anticipate discharge in the next 1-2 days as LFTs improve   All the records are reviewed and case discussed with Care Management/Social Workerr. Management plans discussed with the patient, family and they are in agreement.  CODE STATUS: Full Code  TOTAL TIME TAKING CARE OF THIS PATIENT: 38 minutes.   POSSIBLE D/C IN 1-2 DAYS, DEPENDING ON CLINICAL  CONDITION.   Gladstone Lighter M.D on 10/26/2016 at 5:09 PM  Between 7am to 6pm - Pager - [phone]  After 6pm go to [email or web address] - password [email or web address] [facility]  Office  [phone]  CC: Primary care physician; Baltazar Apo, MD

## 2016-10-27 LAB — GLUCOSE, CAPILLARY
GLUCOSE-CAPILLARY: 133 mg/dL — AB (ref 65–99)
Glucose-Capillary: 83 mg/dL (ref 65–99)

## 2016-10-27 LAB — COMPREHENSIVE METABOLIC PANEL
ALT: 186 U/L — AB (ref 17–63)
AST: 152 U/L — ABNORMAL HIGH (ref 15–41)
Albumin: 2 g/dL — ABNORMAL LOW (ref 3.5–5.0)
Alkaline Phosphatase: 1065 U/L — ABNORMAL HIGH (ref 38–126)
Anion gap: 4 — ABNORMAL LOW (ref 5–15)
BUN: 10 mg/dL (ref 6–20)
CHLORIDE: 102 mmol/L (ref 101–111)
CO2: 26 mmol/L (ref 22–32)
CREATININE: 0.49 mg/dL — AB (ref 0.61–1.24)
Calcium: 7.9 mg/dL — ABNORMAL LOW (ref 8.9–10.3)
GFR calc non Af Amer: >60 mL/min (ref >60)
Glucose, Bld: 97 mg/dL (ref 65–99)
Potassium: 3.6 mmol/L (ref 3.5–5.1)
SODIUM: 132 mmol/L — AB (ref 135–145)
Total Bilirubin: 9.3 mg/dL — ABNORMAL HIGH (ref 0.3–1.2)
Total Protein: 6.4 g/dL — ABNORMAL LOW (ref 6.5–8.1)

## 2016-10-27 MED ORDER — OXYCODONE HCL 5 MG PO TABS: 5.0000 mg | ORAL_TABLET | ORAL | 0 refills | Status: DC | PRN

## 2016-10-27 MED ORDER — ENSURE ENLIVE PO LIQD: 237.0000 mL | Freq: Two times a day (BID) | ORAL | 12 refills | Status: AC

## 2016-10-27 NOTE — Discharge Summary (Signed)
[facility] [place] NAME: Nathan Atkinson    MR#:  [id number]  DATE OF BIRTH:  Jul 24, 1951  DATE OF ADMISSION:  10/24/2016   ADMITTING PHYSICIAN: Epifanio Lesches, MD  DATE OF DISCHARGE: 10/27/16  PRIMARY CARE PHYSICIAN: Baltazar Apo, MD   ADMISSION DIAGNOSIS:   Metastatic Colon CA Jaundie Malignant neoplasm of the rectosigmoid junction C19 Other diseases of the anus and rectum K62.89  DISCHARGE DIAGNOSIS:   Active Problems:   Obstructive jaundice   Protein-calorie malnutrition, severe   SECONDARY DIAGNOSIS:   Past Medical History:  Diagnosis Date  . Cataract 08/2015   forming  . Colorectal cancer ([place]) 10/08/2016  . Diabetes mellitus without complication ([place])   . Hyperlipidemia   . Hypertension   . Platelets decreased [facility])     HOSPITAL COURSE:   65 year old male with past medical history significant for hypertension, diabetes who was just recently diagnosed with metastatic colon cancer presents to Hospital secondary to worsening jaundice.  #1 Obstructive jaundice-secondary to metastatic adenopathy compressing the biliary ducts. -Appreciate GI consult. Status post ERCP and stent placement 10/25/16. -LFTs are still elevated with elevated bilirubin. Stable today. Ultrasound done to see if there are any dilated biliary ducts to see if percutaneous intervention to decompress the biliary tract is possible. However no further dilated biliary ducts noted. -So plan is to monitor until his bilirubin improves.  #2 metastatic colon cancer-just diagnosed 3 weeks ago. Had a Port-A-Cath placed in. -Metastases to liver and also lymph nodes in the abdomen and lungs noted. -Hasn't been started yet on chemotherapy because of his elevated liver tests. -Appreciate oncology consult - plan to follow up on Monday with LFTs to see if chemo can be started Please oncology notes further  #3 GERD-PPI  #4 diabetes mellitus-A1c  is 8 from September 2017. However intake has been reduced now. Discontinued Glipizide and metformin for now. Sugars are within normal limuts Outpatient follow up recommended   Being discharged today with follow up on Monday Family might want to seek second opinion regarding his cancer treatment at [facility] or [facility]. Used an interpretor to discuss  DISCHARGE CONDITIONS:   Guarded  CONSULTS OBTAINED:   Treatment Team:  Manya Silvas, MD Lucilla Lame, MD  DRUG ALLERGIES:   No Known Allergies DISCHARGE MEDICATIONS:     Medication List    STOP taking these medications   atorvastatin 40 MG tablet Commonly known as:  LIPITOR   glipiZIDE 5 MG tablet Commonly known as:  GLUCOTROL   metFORMIN 500 MG tablet Commonly known as:  GLUCOPHAGE   potassium chloride 10 MEQ tablet Commonly known as:  K-DUR     TAKE these medications   enalapril 10 MG tablet Commonly known as:  VASOTEC Take 10 mg by mouth at bedtime.   feeding supplement (ENSURE ENLIVE) Liqd Take 237 mLs by mouth 2 (two) times daily between meals.   lidocaine-prilocaine cream Commonly known as:  EMLA Apply cream 1 hour before chemotherapy treatment   omeprazole 20 MG capsule Commonly known as:  PRILOSEC Take 1 capsule (20 mg total) by mouth daily.   oxyCODONE 5 MG immediate release tablet Commonly known as:  Oxy IR/ROXICODONE Take 1-2 tablets (5-10 mg total) by mouth every 4 (four) hours as needed for moderate pain or severe pain (mild pain).   Polyethylene Glycol Powd Take 17 g by mouth daily.        DISCHARGE INSTRUCTIONS:   1. Follow up with your  oncologist Dr. Nolon Stalls at the cancer center on Monday 10/29/16 for blood work and further follow up  DIET:   Cardiac diet  ACTIVITY:   Activity as tolerated  OXYGEN:   Home Oxygen: No.  Oxygen Delivery: room air  DISCHARGE LOCATION:   home   If you experience worsening of your admission symptoms, develop shortness of breath, life  threatening emergency, suicidal or homicidal thoughts you must seek medical attention immediately by calling 911 or calling your MD immediately  if symptoms less severe.  You Must read complete instructions/literature along with all the possible adverse reactions/side effects for all the Medicines you take and that have been prescribed to you. Take any new Medicines after you have completely understood and accpet all the possible adverse reactions/side effects.   Please note  You were cared for by a hospitalist during your hospital stay. If you have any questions about your discharge medications or the care you received while you were in the hospital after you are discharged, you can call the unit and asked to speak with the hospitalist on call if the hospitalist that took care of you is not available. Once you are discharged, your primary care physician will handle any further medical issues. Please note that NO REFILLS for any discharge medications will be authorized once you are discharged, as it is imperative that you return to your primary care physician (or establish a relationship with a primary care physician if you do not have one) for your aftercare needs so that they can reassess your need for medications and monitor your lab values.    On the day of Discharge:  VITAL SIGNS:   Blood pressure 131/69, pulse 75, temperature 98.2 F (36.8 C), temperature source Oral, resp. rate 18, height 5\' 4"  (1.626 m), weight 60.6 kg (133 lb 11.2 oz), SpO2 97 %.  PHYSICAL EXAMINATION:    GENERAL:  65 y.o.-year-old patient lying in the bed with no acute distress.  EYES: Pupils equal, round, reactive to light and accommodation. positive scleral icterus. Extraocular muscles intact.  HEENT: Head atraumatic, normocephalic. Oropharynx and nasopharynx clear.  NECK:  Supple, no jugular venous distention. No thyroid enlargement, no tenderness.  LUNGS: Normal breath sounds bilaterally, no wheezing,  rales,rhonchi or crepitation. No use of accessory muscles of respiration.  CARDIOVASCULAR: S1, S2 normal. No murmurs, rubs, or gallops.  ABDOMEN: Soft, nontender but discomfort on palpation in periumbilical region, nondistended. Bowel sounds present. No organomegaly or mass.  EXTREMITIES: No pedal edema, cyanosis, or clubbing.  NEUROLOGIC: Cranial nerves II through XII are intact. Muscle strength 5/5 in all extremities. Sensation intact. Gait not checked.  PSYCHIATRIC: The patient is alert and oriented x 3.  SKIN: No obvious Atkinson, lesion, or ulcer.   DATA REVIEW:   CBC  Recent Labs Lab 10/25/16 0504  WBC 9.6  HGB 11.0*  HCT 31.7*  PLT 146*    Chemistries   Recent Labs Lab 10/27/16 0501  NA 132*  K 3.6  CL 102  CO2 26  GLUCOSE 97  BUN 10  CREATININE 0.49*  CALCIUM 7.9*  AST 152*  ALT 186*  ALKPHOS 1,065*  BILITOT 9.3*     Microbiology Results  No results found for this or any previous visit.  RADIOLOGY:  US Abdomen Limited Ruq  Result Date: 10/26/2016 CLINICAL DATA:  Metastatic colon carcinoma to the liver and status post placement of endoscopic biliary stent in the common bile duct to treat biliary obstruction. Evaluation of the liver and bile  ducts performed due to rising bilirubin. EXAM: US ABDOMEN LIMITED - RIGHT UPPER QUADRANT COMPARISON:  CT of the abdomen on 10/23/2016 FINDINGS: Gallbladder: The gallbladder contains sludge. Common bile duct: Diameter: 4-7 mm without significant dilatation. Common bile duct stent is visible by ultrasound. Liver: Multiple masses are seen throughout the liver and the liver shows diffuse enlargement. There is no evidence of significant intrahepatic biliary ductal dilatation. IMPRESSION: No evidence by ultrasound of progressive biliary obstruction status post endoscopic biliary stent placement. Percutaneous biliary drainage currently would not be possible given the decompressed appearance of bile ducts within the liver parenchyma.  Electronically Signed   By: Aletta Edouard M.D.   On: 10/26/2016 15:18     Management plans discussed with the patient, family and they are in agreement.  CODE STATUS:     Code Status Orders        Start     Ordered   10/24/16 1340  Full code  Continuous     10/24/16 1339    Code Status History    Date Active Date Inactive Code Status Order ID Comments User Context   This patient has a current code status but no historical code status.      TOTAL TIME TAKING CARE OF THIS PATIENT: 38 minutes.    Gl[place]stone Lighter M.D on 10/27/2016 at 1:02 PM  Between 7am to 6pm - Pager - [phone]  After 6pm go to [email or web address] - Proofreader  [employer] [facility] [facility]  Office  [phone]  CC: Primary care physician; Baltazar Apo, MD   Note: This dictation was prepared with Dragon dictation along with smaller phrase technology. Any transcriptional errors that result from this process are unintentional.

## 2016-10-27 NOTE — Consult Note (Signed)
[facility]  Date of admission:  10/24/2016  Inpatient day:  10/26/2016  Consulting physician:  Dr. [place] Lighter   Reason for Consultation:  Colon cancer  Chief Complaint: Nathan Atkinson is a 65 y.o. male with metastatic colorectal cancer who was admitted with hyperbilirubinemia prior to ERCP and stent placement.  HPI:  The patient was diagnosed with metastatic colorectal cancer on 09/26/2016.  He presented with a 4 month history of rectal bleeding.  Abdomen and pelvic CT scan on 09/26/2016 revealed innumerable ill-defined liver lesions compatible with metastatic disease. Rectal wall appeared thickened and irregular raising the concern for neoplasm. Chest CT on 10/15/2016 revealed multiple pulmonary nodules suspicious for metastatic disease.   Colonoscopy on 10/04/2016 revealed a malignant partially obstructing tumor in the recto-sigmoid colon.  The mass was circumferential and measured 9 cm in length.  At about 18 cm from the anal verge, stenosis prevented the scope to be passed further proximally.  Pathology revealed moderately differentiated invasive adenocarcinoma.  CEA was 8.0 on 10/08/2016.  He was seen in the medical oncology clinic on 10/23/2016 for initiation of FOLFOX chemotherapy.  He had new jaundice (bilirubin 5.6) with increased enzymes and alkaline phosphatase (925).  LFTs on 10/08/2016 revealed a bilirubin of 0.3.  He underwent STAT abdomen CT scan on 10/23/2016.  Findings revealed increase in intra hepatic biliary duct dilatation.  There was increased inflammation around the gallbladder.  Findings were concerning for biliary obstruction at the level of porta hepatis.  He was admitted on 10/25/2016 for antibiotics prior to ERCP and stent placement on 10/26/2016 by Dr. Allen Norris.   A segmental biliary stricture was found.  The entire biliary tree was dilated.  A biliary sphincterotomy was performed.  A plastic stent was placed in the common bile  duct.  The patient's bilirubin has increased from 5.6 on 10/23/2016 to 6.9 on 10/24/2016 to 8.8 on 10/26/2016.  I spoke with Dr. Allen Norris and Dr. Kathlene Cote today about a percutaneous biliary drain.  A RUQ ultrasound was performed.  Imaging revealed no evidence by ultrasound of progressive biliary obstruction status post endoscopic biliary stent placement. Percutaneous biliary drainage currently would not be possible given the decompressed appearance of bile ducts within the liver parenchyma.  Symptomatically, he denies any new complaints.   Past Medical History:  Diagnosis Date  . Cataract 08/2015   forming  . Colorectal cancer ([place]) 10/08/2016  . Diabetes mellitus without complication ([place])   . Hyperlipidemia   . Hypertension   . Platelets decreased ([place])     Past Surgical History:  Procedure Laterality Date  . CARDIAC CATHETERIZATION  2003  . COLONOSCOPY N/A 10/04/2016   Procedure: COLONOSCOPY;  Surgeon: Lollie Sails, MD;  Location: [facility] ENDOSCOPY;  Service: Endoscopy;  Laterality: N/A;  . ERCP N/A 10/25/2016   Procedure: ENDOSCOPIC RETROGRADE CHOLANGIOPANCREATOGRAPHY (ERCP);  Surgeon: Lucilla Lame, MD;  Location: [facility] ENDOSCOPY;  Service: Endoscopy;  Laterality: N/A;  . ESOPHAGOGASTRODUODENOSCOPY (EGD) WITH PROPOFOL N/A 10/04/2016   Procedure: ESOPHAGOGASTRODUODENOSCOPY (EGD) WITH PROPOFOL;  Surgeon: Lollie Sails, MD;  Location: [facility] ENDOSCOPY;  Service: Endoscopy;  Laterality: N/A;  . PORTACATH PLACEMENT Left 10/12/2016   Procedure: INSERTION PORT-A-CATH;  Surgeon: Olean Ree, MD;  Location: [facility] ORS;  Service: General;  Laterality: Left;    Family History  Problem Relation Age of Onset  . Cancer Brother     not sure what kind    Social History:  reports that he quit smoking about 21 years ago. His smoking use included  Cigarettes. He quit after 10.00 years of use. He has never used smokeless tobacco. He reports that he drinks alcohol. He reports that he does not use  drugs.  He is accompanied by his wife and 2 sons.  Allergies: No Known Allergies  Medications Prior to Admission  Medication Sig Dispense Refill  . atorvastatin (LIPITOR) 40 MG tablet Take 40 mg by mouth at bedtime. Reported on 05/09/2016    . enalapril (VASOTEC) 10 MG tablet Take 10 mg by mouth at bedtime.     Marland Kitchen glipiZIDE (GLUCOTROL) 5 MG tablet Take 5 mg by mouth daily after supper.     . lidocaine-prilocaine (EMLA) cream Apply cream 1 hour before chemotherapy treatment 30 g 1  . metFORMIN (GLUCOPHAGE) 500 MG tablet Take 1,000 mg by mouth 2 (two) times daily. Reported on 08/22/17/2017- taking 2-543m bid Metformin- causing extreme number of bowel movements and abd. Pain Reported on 09/05/16- now taking 2 tabs q am and 1 tab in the pm- diarrhea has decreased.    .Marland Kitchenomeprazole (PRILOSEC) 20 MG capsule Take 1 capsule (20 mg total) by mouth daily. 30 capsule 3  . Polyethylene Glycol POWD Take 17 g by mouth daily. 527 g 5  . potassium chloride (K-DUR) 10 MEQ tablet Take 4 tablets (40 mEq total) by mouth daily. (Patient not taking: Reported on 10/24/2016) 4 tablet 0    Review of Systems: GENERAL:  Feels "ok". No fevers or sweats.  Weight loss of 7 pounds since last visit. PERFORMANCE STATUS (ECOG):  0 HEENT:  No visual changes, runny nose, sore throat, mouth sores or tenderness. Lungs: No shortness of breath or cough.  No hemoptysis. Cardiac:  No chest pain, palpitations, orthopnea, or PND. GI:  Sense of urgency.  Small frequent soft stools.  Intermittent hematochezia.  No nausea, vomiting, diarrhea, or melena. GU:  No urgency, frequency, dysuria, or hematuria. Musculoskeletal:  No back pain.  No joint pain.  No muscle tenderness. Extremities:  Intermittent left leg pain.  No swelling. Skin:  No rashes or skin changes. Neuro:  No headache, numbness or weakness, balance or coordination issues. Endocrine:  Diabetes.  No tthyroid issues, hot flashes or night sweats. Psych:  No mood changes,  depression or anxiety. Pain:  RUQ discomfort. Review of systems:  All other systems reviewed and found   Physical Exam:  Blood pressure 131/69, pulse 75, temperature 98.2 F (36.8 C), temperature source Oral, resp. rate 18, height '5\' 4"'  (1.626 m), weight 133 lb 11.2 oz (60.6 kg), SpO2 97 %.  GENERAL:  Well developed, well nourished, gentleman sitting comfortably on the medical unit in no acute distress. MENTAL STATUS:  Alert and oriented to person, place and time. HEAD:  Shaved head.  Normocephalic, atraumatic, face symmetric, no Cushingoid features. EYES:  Glasses.  Brown eyes.  Scleral icterus..  Pupils equal round and reactive to light and accomodation.  No conjunctivitis. ENT:  Oropharynx clear without lesion.  Tongue normal. Mucous membranes moist.  RESPIRATORY:  Clear to auscultation without rales, wheezes or rhonchi. CARDIOVASCULAR:  Regular rate and rhythm without murmur, rub or gallop. ABDOMEN:  Soft, non-tender, with active bowel sounds, and no hepatosplenomegaly.  No masses. SKIN:  No rashes, ulcers or lesions. EXTREMITIES: No edema, no skin discoloration or tenderness.  No palpable cords. NEUROLOGICAL: Unremarkable. PSYCH:  Appropriate.   Results for orders placed or performed during the hospital encounter of 10/24/16 (from the past 48 hour(s))  Glucose, capillary     Status: None   Collection Time:  10/25/16  7:28 AM  Result Value Ref Range   Glucose-Capillary 71 65 - 99 mg/dL  Glucose, capillary     Status: Abnormal   Collection Time: 10/25/16  9:47 AM  Result Value Ref Range   Glucose-Capillary 121 (H) 65 - 99 mg/dL  Glucose, capillary     Status: Abnormal   Collection Time: 10/25/16  2:02 PM  Result Value Ref Range   Glucose-Capillary 105 (H) 65 - 99 mg/dL  Glucose, capillary     Status: None   Collection Time: 10/25/16  5:04 PM  Result Value Ref Range   Glucose-Capillary 86 65 - 99 mg/dL  Glucose, capillary     Status: Abnormal   Collection Time: 10/25/16  8:57  PM  Result Value Ref Range   Glucose-Capillary 134 (H) 65 - 99 mg/dL  Lipase, blood     Status: None   Collection Time: 10/26/16  5:22 AM  Result Value Ref Range   Lipase 19 11 - 51 U/L  Comprehensive metabolic panel     Status: Abnormal   Collection Time: 10/26/16  5:22 AM  Result Value Ref Range   Sodium 134 (L) 135 - 145 mmol/L   Potassium 3.8 3.5 - 5.1 mmol/L   Chloride 102 101 - 111 mmol/L   CO2 25 22 - 32 mmol/L   Glucose, Bld 107 (H) 65 - 99 mg/dL   BUN 10 6 - 20 mg/dL   Creatinine, Ser 0.49 (L) 0.61 - 1.24 mg/dL   Calcium 8.0 (L) 8.9 - 10.3 mg/dL   Total Protein 6.7 6.5 - 8.1 g/dL   Albumin 2.2 (L) 3.5 - 5.0 g/dL   AST 185 (H) 15 - 41 U/L   ALT 209 (H) 17 - 63 U/L   Alkaline Phosphatase 1,054 (H) 38 - 126 U/L   Total Bilirubin 8.8 (H) 0.3 - 1.2 mg/dL   GFR calc non Af Amer >60 >60 mL/min   GFR calc Af Amer >60 >60 mL/min    Comment: (NOTE) The eGFR has been calculated using the CKD EPI equation. This calculation has not been validated in all clinical situations. eGFR's persistently <60 mL/min signify possible Chronic Kidney Disease.    Anion gap 7 5 - 15  Glucose, capillary     Status: Abnormal   Collection Time: 10/26/16  8:01 AM  Result Value Ref Range   Glucose-Capillary 100 (H) 65 - 99 mg/dL  Glucose, capillary     Status: Abnormal   Collection Time: 10/26/16 11:39 AM  Result Value Ref Range   Glucose-Capillary 183 (H) 65 - 99 mg/dL  Glucose, capillary     Status: Abnormal   Collection Time: 10/26/16  4:57 PM  Result Value Ref Range   Glucose-Capillary 157 (H) 65 - 99 mg/dL  Glucose, capillary     Status: Abnormal   Collection Time: 10/26/16  9:31 PM  Result Value Ref Range   Glucose-Capillary 111 (H) 65 - 99 mg/dL   Comment 1 Notify RN   Comprehensive metabolic panel     Status: Abnormal   Collection Time: 10/27/16  5:01 AM  Result Value Ref Range   Sodium 132 (L) 135 - 145 mmol/L   Potassium 3.6 3.5 - 5.1 mmol/L   Chloride 102 101 - 111 mmol/L    CO2 26 22 - 32 mmol/L   Glucose, Bld 97 65 - 99 mg/dL   BUN 10 6 - 20 mg/dL   Creatinine, Ser 0.49 (L) 0.61 - 1.24 mg/dL   Calcium 7.9 (  L) 8.9 - 10.3 mg/dL   Total Protein 6.4 (L) 6.5 - 8.1 g/dL   Albumin 2.0 (L) 3.5 - 5.0 g/dL   AST 152 (H) 15 - 41 U/L   ALT 186 (H) 17 - 63 U/L   Alkaline Phosphatase 1,065 (H) 38 - 126 U/L   Total Bilirubin 9.3 (H) 0.3 - 1.2 mg/dL   GFR calc non Af Amer >60 >60 mL/min   GFR calc Af Amer >60 >60 mL/min    Comment: (NOTE) The eGFR has been calculated using the CKD EPI equation. This calculation has not been validated in all clinical situations. eGFR's persistently <60 mL/min signify possible Chronic Kidney Disease.    Anion gap 4 (L) 5 - 15   US Abdomen Limited Ruq  Result Date: 10/26/2016 CLINICAL DATA:  Metastatic colon carcinoma to the liver and status post placement of endoscopic biliary stent in the common bile duct to treat biliary obstruction. Evaluation of the liver and bile ducts performed due to rising bilirubin. EXAM: US ABDOMEN LIMITED - RIGHT UPPER QUADRANT COMPARISON:  CT of the abdomen on 10/23/2016 FINDINGS: Gallbladder: The gallbladder contains sludge. Common bile duct: Diameter: 4-7 mm without significant dilatation. Common bile duct stent is visible by ultrasound. Liver: Multiple masses are seen throughout the liver and the liver shows diffuse enlargement. There is no evidence of significant intrahepatic biliary ductal dilatation. IMPRESSION: No evidence by ultrasound of progressive biliary obstruction status post endoscopic biliary stent placement. Percutaneous biliary drainage currently would not be possible given the decompressed appearance of bile ducts within the liver parenchyma. Electronically Signed   By: Aletta Edouard M.D.   On: 10/26/2016 15:18    Assessment:  The patient is a 65 y.o.  gentleman with metastatic colorectal carcinoma.  He has numerous pulmonary and hepatic metastasis.  He has a  partially obstructing colorectal  mass.  He presented with acute jaundice (bilirubin 5.6) on 10/23/2016.  Abdomen CT scan on 10/23/2016 revealed increase in intra hepatic biliary duct dilatation.  There was increased inflammation around the gallbladder.  Findings were concerning for biliary obstruction at the level of porta hepatis.  He underwent ERCP and stent placement on 10/26/2016.   A segmental biliary stricture was found.  The entire biliary tree was dilated.  A biliary sphincterotomy was performed.  A plastic stent was placed in the common bile duct.  His bilirubin has increased from 5.6 on 10/23/2016 to 8.8 on 10/26/2016.  RUQ ultrasound revealed no evidence by ultrasound of progressive biliary obstruction status post endoscopic biliary stent placement. Percutaneous biliary drainage currently would not be possible given the decompressed appearance of bile ducts within the liver parenchyma.  Plan:   1. Oncology:  Coordinated care performed throughout the day with radiology and gastroenterology.  Increasing bilirubin despite stent placement with no option for external drain.  Discussed with patient and family in detail.  No other options at [facility].  Patient's son inquired about taking him some place else.  I discussed that he could go to [facility] or [facility] or another institution, but I am unclear if they would have any options.  Discuss chemotherapy options are limited with markedly elevated liver enzymes.  Discuss plans for follow-up in clinic on Monday, 10/29/2016, for likely single agent oxaliplatin.  If liver function tests improve, discuss multi-agent chemotherapy.  2.  Disposition:  Anticipate discharge on Saturday, 10/27/2016.  Patient has appointment in clinic on 10/29/2016 at 9:30 AM (arrive at 9 AM for labs).  Please inform patient of time (  I was not aware of his appointment time during our meeting).   Thank you for allowing me to participate in [place] 's care.  I will follow him closely with you while  hospitalized and after discharge in the outpatient department.  Dr Grayland Ormond is on call this weekend.   Lequita Asal, MD  10/26/2016

## 2016-10-27 NOTE — Progress Notes (Signed)
Discharge instructions along with home medications and follow up gone over with patient and family with an interpreter. All family verbalizes that they understood instructions. One prescriptions given to patient. IV removed. Pt being discharged home on room air, no distress noted. Ammie Dalton, RN

## 2016-10-28 NOTE — Progress Notes (Signed)
[facility] [place] day:  10/29/2016  Chief Complaint: Nathan Atkinson is a 65 y.o. male with metastatic colorectal cancer who is seen for assessment following interval hospitalization and assessment prior to cycle #1 FOLFOX chemotherapy.  HPI: The patient was last seen in the medical oncology clinic on 10/23/2016.  At that time, chemotherapy was cancelled secondary to a bilirubin of 5.6.  He underwent abdomen CT scan.  Abdomen CT scan on 10/23/2016 revealed increase in intra hepatic biliary duct dilatation.  There was increased inflammation around the gallbladder.  Findings were concerning for biliary obstruction at the level of porta hepatis. Most likely etiology would be metastatic adenopathy in the porta hepatis constricting the extrahepatic bile duct. Consider ERCP with bile duct stenting.  Common bile duct was normal caliber through the pancreatic head.  There was no pancreatic inflammation.  There was interval increase in size of hepatic metastasis.  There was interval increase in retroperitoneal adenopathy.  He was admitted to [facility] from 10/25/2016 - 10/27/2016.  He underwent ERCP and stent placement on 10/26/2016.  A segmental biliary stricture was found.  The entire biliary tree was dilated.  A biliary sphincterotomy was performed.  A plastic stent was placed in the common bile duct.  The patient's bilirubin increased from 5.6 on 10/23/2016 to 8.8 on 10/26/2016.   RUQ ultrasound revealed no evidence by ultrasound of progressive biliary obstruction status post endoscopic biliary stent placement. Percutaneous biliary drainage was not be possible given the decompressed appearance.  Bilirubin was 9.3 on 10/27/2016.  Symptomatically,  He has little appetite.  He is now drinking Boost.  Bowel movements are frequent, but only a small amount.  He also notes a little blood in the stool.  He notes pain in his sternum.    Past Medical History:  Diagnosis  Date  . Cataract 08/2015   forming  . Colorectal cancer ([place]) 10/08/2016  . Diabetes mellitus without complication ([place])   . Hyperlipidemia   . Hypertension   . Platelets decreased ([place])     Past Surgical History:  Procedure Laterality Date  . CARDIAC CATHETERIZATION  2003  . COLONOSCOPY N/A 10/04/2016   Procedure: COLONOSCOPY;  Surgeon: Lollie Sails, MD;  Location: [facility] ENDOSCOPY;  Service: Endoscopy;  Laterality: N/A;  . ERCP N/A 10/25/2016   Procedure: ENDOSCOPIC RETROGRADE CHOLANGIOPANCREATOGRAPHY (ERCP);  Surgeon: Lucilla Lame, MD;  Location: [facility] ENDOSCOPY;  Service: Endoscopy;  Laterality: N/A;  . ESOPHAGOGASTRODUODENOSCOPY (EGD) WITH PROPOFOL N/A 10/04/2016   Procedure: ESOPHAGOGASTRODUODENOSCOPY (EGD) WITH PROPOFOL;  Surgeon: Lollie Sails, MD;  Location: [facility] ENDOSCOPY;  Service: Endoscopy;  Laterality: N/A;  . PORTACATH PLACEMENT Left 10/12/2016   Procedure: INSERTION PORT-A-CATH;  Surgeon: Olean Ree, MD;  Location: [facility] ORS;  Service: General;  Laterality: Left;    Family History  Problem Relation Age of Onset  . Cancer Brother     not sure what kind    Social History:  reports that he quit smoking about 21 years ago. His smoking use included Cigarettes. He quit after 10.00 years of use. He has never used smokeless tobacco. He reports that he drinks alcohol. He reports that he does not use drugs.  He works in Charity fundraiser at [employer].  The patient is accompanied by his wife, [name], son, and the Spanish interpreter today.  Allergies: No Known Allergies  Current Medications: Current Outpatient Prescriptions  Medication Sig Dispense Refill  . enalapril (VASOTEC) 10 MG tablet Take 10 mg by mouth at bedtime.     Marland Kitchen  feeding supplement, ENSURE ENLIVE, (ENSURE ENLIVE) LIQD Take 237 mLs by mouth 2 (two) times daily between meals. 237 mL 12  . lidocaine-prilocaine (EMLA) cream Apply cream 1 hour before chemotherapy treatment 30 g 1  . omeprazole (PRILOSEC) 20 MG  capsule Take 1 capsule (20 mg total) by mouth daily. 30 capsule 3  . oxyCODONE (OXY IR/ROXICODONE) 5 MG immediate release tablet Take 1-2 tablets (5-10 mg total) by mouth every 4 (four) hours as needed for moderate pain or severe pain (mild pain). 30 tablet 0  . Polyethylene Glycol POWD Take 17 g by mouth daily. 527 g 5   No current facility-administered medications for this visit.    Facility-Administered Medications Ordered in Other Visits  Medication Dose Route Frequency Provider Last Rate Last Dose  . dexamethasone (DECADRON) injection 10 mg  10 mg Intravenous Once Lequita Asal, MD   10 mg at 10/29/16 1208  . fluorouracil (ADRUCIL) 2,000 mg in sodium chloride 0.9 % 110 mL chemo infusion  1,200 mg/m2 (Treatment Plan Recorded) Intravenous 1 day or 1 dose Lequita Asal, MD      . heparin lock flush 100 unit/mL  500 Units Intravenous Once Lequita Asal, MD      . heparin lock flush 100 unit/mL  500 Units Intravenous Once Lequita Asal, MD      . oxaliplatin (ELOXATIN) 125 mg in dextrose 5 % 500 mL chemo infusion  75 mg/m2 (Treatment Plan Recorded) Intravenous Once Lequita Asal, MD      . sodium chloride 0.9 % injection 10 mL  10 mL Intravenous Once Lequita Asal, MD      . sodium chloride flush (NS) 0.9 % injection 10 mL  10 mL Intravenous PRN Lequita Asal, MD   10 mL at 10/29/16 9163    Review of Systems:  GENERAL:  Fatigue.  No fevers or sweats.  Weight loss of 7 pounds since last visit. PERFORMANCE STATUS (ECOG):  0 HEENT:  No visual changes, runny nose, sore throat, mouth sores or tenderness. Lungs: No shortness of breath or cough.  No hemoptysis. Cardiac:  No chest pain, palpitations, orthopnea, or PND. GI:  Fecal urgency.  Small frequent soft stools.  Hematochezia.  No nausea, vomiting, diarrhea, or melena. GU:  No urgency, frequency, dysuria, or hematuria. Musculoskeletal:  No back pain.  No joint pain.  No muscle tenderness. Extremities:   Intermittent left leg pain.  No swelling. Skin:  No rashes or skin changes. Neuro:  No headache, numbness or weakness, balance or coordination issues. Endocrine:  Diabetes.  No tthyroid issues, hot flashes or night sweats. Psych:  No mood changes, depression or anxiety. Pain:  No focal pain. Review of systems:  All other systems reviewed and found to be negative.  Physical Exam: Blood pressure 128/74, pulse 96, temperature 97.1 F (36.2 C), temperature source Tympanic, resp. rate 18, weight 142 lb 10.2 oz (64.7 kg). GENERAL:  Well developed, well nourished, gentleman sitting comfortably in the exam room in no acute distress. MENTAL STATUS:  Alert and oriented to person, place and time. HEAD:  Wearing a black cap.  Shaved head.  Normocephalic, atraumatic, face symmetric, no Cushingoid features. EYES:  Glasses.  Brown eyes.  Deep scleral icterus.  Pupils equal round and reactive to light and accomodation.  No conjunctivitis. ENT:  Oropharynx clear without lesion.  Tongue normal. Mucous membranes moist.  RESPIRATORY:  Clear to auscultation without rales, wheezes or rhonchi. CARDIOVASCULAR:  Regular rate and rhythm without  murmur, rub or gallop. ABDOMEN:  Soft, non-tender, with active bowel sounds, and no hepatosplenomegaly.  No masses. SKIN:  No rashes, ulcers or lesions. EXTREMITIES: No edema, no skin discoloration or tenderness.  No palpable cords. LYMPH NODES: No palpable cervical, supraclavicular, axillary or inguinal adenopathy  NEUROLOGICAL: Unremarkable. PSYCH:  Appropriate.   Infusion on 10/29/2016  Component Date Value Ref Range Status  . WBC 10/29/2016 10.2  3.8 - 10.6 K/uL Final  . RBC 10/29/2016 3.44* 4.40 - 5.90 MIL/uL Final  . Hemoglobin 10/29/2016 10.7* 13.0 - 18.0 g/dL Final  . HCT 10/29/2016 30.6* 40.0 - 52.0 % Final  . MCV 10/29/2016 89.0  80.0 - 100.0 fL Final  . MCH 10/29/2016 31.1  26.0 - 34.0 pg Final  . MCHC 10/29/2016 35.0  32.0 - 36.0 g/dL Final  . RDW  10/29/2016 14.3  11.5 - 14.5 % Final  . Platelets 10/29/2016 130* 150 - 440 K/uL Final  . Neutrophils Relative % 10/29/2016 81  % Final  . Lymphocytes Relative 10/29/2016 10  % Final  . Monocytes Relative 10/29/2016 6  % Final  . Eosinophils Relative 10/29/2016 2  % Final  . Basophils Relative 10/29/2016 1  % Final  . Neutro Abs 10/29/2016 8.3* 1.4 - 6.5 K/uL Final  . Lymphs Abs 10/29/2016 1.0  1.0 - 3.6 K/uL Final  . Monocytes Absolute 10/29/2016 0.6  0.2 - 1.0 K/uL Final  . Eosinophils Absolute 10/29/2016 0.2  0 - 0.7 K/uL Final  . Basophils Absolute 10/29/2016 0.1  0 - 0.1 K/uL Final  . RBC Morphology 10/29/2016 TARGET CELLS   Final  . Sodium 10/29/2016 124* 135 - 145 mmol/L Final  . Potassium 10/29/2016 3.3* 3.5 - 5.1 mmol/L Final  . Chloride 10/29/2016 93* 101 - 111 mmol/L Final  . CO2 10/29/2016 25  22 - 32 mmol/L Final  . Glucose, Bld 10/29/2016 161* 65 - 99 mg/dL Final  . BUN 10/29/2016 13  6 - 20 mg/dL Final  . Creatinine, Ser 10/29/2016 0.34* 0.61 - 1.24 mg/dL Final  . Calcium 10/29/2016 8.1* 8.9 - 10.3 mg/dL Final  . Total Protein 10/29/2016 7.3  6.5 - 8.1 g/dL Final  . Albumin 10/29/2016 2.2* 3.5 - 5.0 g/dL Final  . AST 10/29/2016 123* 15 - 41 U/L Final  . ALT 10/29/2016 158* 17 - 63 U/L Final  . Alkaline Phosphatase 10/29/2016 1078* 38 - 126 U/L Final  . Total Bilirubin 10/29/2016 14.1* 0.3 - 1.2 mg/dL Final  . GFR calc non Af Amer 10/29/2016 >60  >60 mL/min Final  . GFR calc Af Amer 10/29/2016 >60  >60 mL/min Final   Comment: (NOTE) The eGFR has been calculated using the CKD EPI equation. This calculation has not been validated in all clinical situations. eGFR's persistently <60 mL/min signify possible Chronic Kidney Disease.   . Anion gap 10/29/2016 6  5 - 15 Final  Admission on 10/24/2016, Discharged on 10/27/2016  Component Date Value Ref Range Status  . WBC 10/24/2016 8.6  3.8 - 10.6 K/uL Final  . RBC 10/24/2016 3.59* 4.40 - 5.90 MIL/uL Final  . Hemoglobin  10/24/2016 11.0* 13.0 - 18.0 g/dL Final  . HCT 10/24/2016 31.8* 40.0 - 52.0 % Final  . MCV 10/24/2016 88.6  80.0 - 100.0 fL Final  . MCH 10/24/2016 30.6  26.0 - 34.0 pg Final  . MCHC 10/24/2016 34.5  32.0 - 36.0 g/dL Final  . RDW 10/24/2016 14.1  11.5 - 14.5 % Final  . Platelets 10/24/2016 152  150 - 440 K/uL Final  . Neutrophils Relative % 10/24/2016 83  % Final  . Neutro Abs 10/24/2016 7.1* 1.4 - 6.5 K/uL Final  . Lymphocytes Relative 10/24/2016 7  % Final  . Lymphs Abs 10/24/2016 0.6* 1.0 - 3.6 K/uL Final  . Monocytes Relative 10/24/2016 9  % Final  . Monocytes Absolute 10/24/2016 0.8  0.2 - 1.0 K/uL Final  . Eosinophils Relative 10/24/2016 1  % Final  . Eosinophils Absolute 10/24/2016 0.1  0 - 0.7 K/uL Final  . Basophils Relative 10/24/2016 0  % Final  . Basophils Absolute 10/24/2016 0.0  0 - 0.1 K/uL Final  . Sodium 10/24/2016 130* 135 - 145 mmol/L Final  . Potassium 10/24/2016 3.3* 3.5 - 5.1 mmol/L Final  . Chloride 10/24/2016 97* 101 - 111 mmol/L Final  . CO2 10/24/2016 25  22 - 32 mmol/L Final  . Glucose, Bld 10/24/2016 192* 65 - 99 mg/dL Final  . BUN 10/24/2016 14  6 - 20 mg/dL Final  . Creatinine, Ser 10/24/2016 0.50* 0.61 - 1.24 mg/dL Final  . Calcium 10/24/2016 8.5* 8.9 - 10.3 mg/dL Final  . Total Protein 10/24/2016 7.1  6.5 - 8.1 g/dL Final  . Albumin 10/24/2016 2.3* 3.5 - 5.0 g/dL Final  . AST 10/24/2016 158* 15 - 41 U/L Final  . ALT 10/24/2016 186* 17 - 63 U/L Final  . Alkaline Phosphatase 10/24/2016 999* 38 - 126 U/L Final  . Total Bilirubin 10/24/2016 6.9* 0.3 - 1.2 mg/dL Final  . GFR calc non Af Amer 10/24/2016 >60  >60 mL/min Final  . GFR calc Af Amer 10/24/2016 >60  >60 mL/min Final   Comment: (NOTE) The eGFR has been calculated using the CKD EPI equation. This calculation has not been validated in all clinical situations. eGFR's persistently <60 mL/min signify possible Chronic Kidney Disease.   . Anion gap 10/24/2016 8  5 - 15 Final  . WBC 10/25/2016 9.6   3.8 - 10.6 K/uL Final  . RBC 10/25/2016 3.56* 4.40 - 5.90 MIL/uL Final  . Hemoglobin 10/25/2016 11.0* 13.0 - 18.0 g/dL Final  . HCT 10/25/2016 31.7* 40.0 - 52.0 % Final  . MCV 10/25/2016 89.1  80.0 - 100.0 fL Final  . MCH 10/25/2016 30.8  26.0 - 34.0 pg Final  . MCHC 10/25/2016 34.6  32.0 - 36.0 g/dL Final  . RDW 10/25/2016 14.5  11.5 - 14.5 % Final  . Platelets 10/25/2016 146* 150 - 440 K/uL Final  . Sodium 10/25/2016 131* 135 - 145 mmol/L Final  . Potassium 10/25/2016 3.7  3.5 - 5.1 mmol/L Final  . Chloride 10/25/2016 99* 101 - 111 mmol/L Final  . CO2 10/25/2016 24  22 - 32 mmol/L Final  . Glucose, Bld 10/25/2016 72  65 - 99 mg/dL Final  . BUN 10/25/2016 11  6 - 20 mg/dL Final  . Creatinine, Ser 10/25/2016 0.53* 0.61 - 1.24 mg/dL Final  . Calcium 10/25/2016 8.1* 8.9 - 10.3 mg/dL Final  . GFR calc non Af Amer 10/25/2016 >60  >60 mL/min Final  . GFR calc Af Amer 10/25/2016 >60  >60 mL/min Final   Comment: (NOTE) The eGFR has been calculated using the CKD EPI equation. This calculation has not been validated in all clinical situations. eGFR's persistently <60 mL/min signify possible Chronic Kidney Disease.   . Anion gap 10/25/2016 8  5 - 15 Final  . Glucose-Capillary 10/24/2016 166* 65 - 99 mg/dL Final  . Glucose-Capillary 10/24/2016 85  65 - 99 mg/dL Final  .  Glucose-Capillary 10/25/2016 71  65 - 99 mg/dL Final  . Glucose-Capillary 10/25/2016 121* 65 - 99 mg/dL Final  . POCT Glucose ([facility]) 10/25/2016 105* 70 - 99 mg/dL Final  . Lipase 10/26/2016 19  11 - 51 U/L Final  . Sodium 10/26/2016 134* 135 - 145 mmol/L Final  . Potassium 10/26/2016 3.8  3.5 - 5.1 mmol/L Final  . Chloride 10/26/2016 102  101 - 111 mmol/L Final  . CO2 10/26/2016 25  22 - 32 mmol/L Final  . Glucose, Bld 10/26/2016 107* 65 - 99 mg/dL Final  . BUN 10/26/2016 10  6 - 20 mg/dL Final  . Creatinine, Ser 10/26/2016 0.49* 0.61 - 1.24 mg/dL Final  . Calcium 10/26/2016 8.0* 8.9 - 10.3 mg/dL Final  . Total Protein  10/26/2016 6.7  6.5 - 8.1 g/dL Final  . Albumin 10/26/2016 2.2* 3.5 - 5.0 g/dL Final  . AST 10/26/2016 185* 15 - 41 U/L Final  . ALT 10/26/2016 209* 17 - 63 U/L Final  . Alkaline Phosphatase 10/26/2016 1054* 38 - 126 U/L Final  . Total Bilirubin 10/26/2016 8.8* 0.3 - 1.2 mg/dL Final  . GFR calc non Af Amer 10/26/2016 >60  >60 mL/min Final  . GFR calc Af Amer 10/26/2016 >60  >60 mL/min Final   Comment: (NOTE) The eGFR has been calculated using the CKD EPI equation. This calculation has not been validated in all clinical situations. eGFR's persistently <60 mL/min signify possible Chronic Kidney Disease.   . Anion gap 10/26/2016 7  5 - 15 Final  . Glucose-Capillary 10/25/2016 86  65 - 99 mg/dL Final  . Glucose-Capillary 10/25/2016 134* 65 - 99 mg/dL Final  . Glucose-Capillary 10/26/2016 100* 65 - 99 mg/dL Final  . Glucose-Capillary 10/26/2016 105* 65 - 99 mg/dL Final  . Glucose-Capillary 10/26/2016 183* 65 - 99 mg/dL Final  . Glucose-Capillary 10/26/2016 157* 65 - 99 mg/dL Final  . Sodium 10/27/2016 132* 135 - 145 mmol/L Final  . Potassium 10/27/2016 3.6  3.5 - 5.1 mmol/L Final  . Chloride 10/27/2016 102  101 - 111 mmol/L Final  . CO2 10/27/2016 26  22 - 32 mmol/L Final  . Glucose, Bld 10/27/2016 97  65 - 99 mg/dL Final  . BUN 10/27/2016 10  6 - 20 mg/dL Final  . Creatinine, Ser 10/27/2016 0.49* 0.61 - 1.24 mg/dL Final  . Calcium 10/27/2016 7.9* 8.9 - 10.3 mg/dL Final  . Total Protein 10/27/2016 6.4* 6.5 - 8.1 g/dL Final  . Albumin 10/27/2016 2.0* 3.5 - 5.0 g/dL Final  . AST 10/27/2016 152* 15 - 41 U/L Final  . ALT 10/27/2016 186* 17 - 63 U/L Final  . Alkaline Phosphatase 10/27/2016 1065* 38 - 126 U/L Final  . Total Bilirubin 10/27/2016 9.3* 0.3 - 1.2 mg/dL Final  . GFR calc non Af Amer 10/27/2016 >60  >60 mL/min Final  . GFR calc Af Amer 10/27/2016 >60  >60 mL/min Final   Comment: (NOTE) The eGFR has been calculated using the CKD EPI equation. This calculation has not been  validated in all clinical situations. eGFR's persistently <60 mL/min signify possible Chronic Kidney Disease.   . Anion gap 10/27/2016 4* 5 - 15 Final  . Glucose-Capillary 10/26/2016 111* 65 - 99 mg/dL Final  . Comment 1 10/26/2016 Notify RN   Final  . Glucose-Capillary 10/27/2016 83  65 - 99 mg/dL Final  . Comment 1 10/27/2016 Notify RN   Final  . Comment 2 10/27/2016 Document in Chart   Final  . Glucose-Capillary 10/27/2016  133* 65 - 99 mg/dL Final  . Comment 1 10/27/2016 Notify RN   Final  . Comment 2 10/27/2016 Document in Chart   Final    Assessment:  Aylen Gleb Mcguire is a 65 y.o. male with metastatic colorectal carcinoma.  He presented with a 4 month history of rectal bleeding.  Abdomen and pelvic CT scan on 09/26/2016 revealed innumerable ill-defined liver lesions compatible with metastatic disease. There were pulmonary nodules in the right lower lobe suggestive of metastatic involvement.  Rectal wall appeared thickened and irregular raising the concern for neoplasm. There was perirectal edema/inflammation and some small perirectal lymph nodes. There was necrotic lymphadenopathy in the upper abdomen with periaortic retroperitoneal adenopathy.  Chest CT on 10/15/2016 revealed multiple pulmonary nodules suspicious for metastatic disease.  Index nodules included an 8.3 x 9.7 mm nodule in the right lower lobe and a 10.0 x 10.0 mm nodule in the medial left lower lobe.  Colonoscopy on 10/04/2016 revealed a malignant partially obstructing tumor in the recto-sigmoid colon.  The mass was circumferential and measured 9 cm in length.  At about 18 cm from the anal verge, stenosis prevented the scope to be passed further proximally.  Pathology revealed moderately differentiated invasive adenocarcinoma.  CEA was 8.0 on 10/08/2016.  He underwent ERCP and stent placement on 10/26/2016.  A segmental biliary stricture was found.  The entire biliary tree was dilated.  A biliary sphincterotomy  was performed.  A plastic stent was placed in the common bile duct.  Bilirubin increased to 8.8 on 10/26/2016.   RUQ ultrasound revealed no evidence by ultrasound of progressive biliary obstruction status post endoscopic biliary stent placement. Percutaneous biliary drainage was not be possible given the decompressed appearance.    He has progressive liver function abnormalities due to progressive liver metastasis.  EGD on 10/04/2016 revealed LA Grade A erosive esophagitis, gastritis, and a normal duodenum.  He has a history of chronic ITP.  Previously his platelet count ranged between 50,000 and 60,000. Work-up on 10/12/2014 revealed the following negative studies: ANA, hepatitis B surface antigen, hepatitis C antibody, HIV testing, ANA, SPEP, UPEP, B12, folate, and LDH.   Symptomatically, he has fecal urgency.  He has increasing jaundice (bilirubin 14.1) with increased enzymes and alkaline phosphatase (1,078).  He has hyponatremia (sodium 124) and hypokalemia (potassium 3.3).  Plan: 1.  Labs today:  CBC with diff, CMP, CEA. 2.  Discuss plans to initiate chemotherapy at dose reduction.  Discuss modified FOLFOX.  Patient's son requests treatment at [facility] or [facility].  Discuss concern for delay in treatment given rapidly progressive disease, delay in clinic assessment and treatment, and upcoming Thanksgiving holiday.  While patient was in clinic, I spoke with Dr. Berneice Gandy at [facility] who agreed to see patient urgently.  He agreed with initiating treatment today.  He would like to proceed with oxaliplatin alone then initiation of oral Xeloda. 3.  Oxaliplatin today. 4.  Patient to follow-up at [facility] this week. 5.  RTC prn.  Over 40 minutes were spent today discussing patient's current medical condition, his treatment options, and coordinating patient's care.   Lequita Asal, MD  10/29/2016, 12:08 PM

## 2016-10-29 ENCOUNTER — Telehealth: Payer: Self-pay | Admitting: Pharmacist

## 2016-10-29 ENCOUNTER — Inpatient Hospital Stay (HOSPITAL_BASED_OUTPATIENT_CLINIC_OR_DEPARTMENT_OTHER): Payer: 59 | Admitting: Hematology and Oncology

## 2016-10-29 ENCOUNTER — Telehealth: Payer: Self-pay | Admitting: *Deleted

## 2016-10-29 ENCOUNTER — Inpatient Hospital Stay: Payer: 59

## 2016-10-29 ENCOUNTER — Encounter: Payer: Self-pay | Admitting: Pharmacist

## 2016-10-29 ENCOUNTER — Other Ambulatory Visit: Payer: Self-pay | Admitting: *Deleted

## 2016-10-29 VITALS — BP 128/74 | HR 96 | Temp 97.1°F | Resp 18 | Wt 142.6 lb

## 2016-10-29 DIAGNOSIS — E876 Hypokalemia: Secondary | ICD-10-CM

## 2016-10-29 DIAGNOSIS — E119 Type 2 diabetes mellitus without complications: Secondary | ICD-10-CM | POA: Diagnosis not present

## 2016-10-29 DIAGNOSIS — I10 Essential (primary) hypertension: Secondary | ICD-10-CM

## 2016-10-29 DIAGNOSIS — Z809 Family history of malignant neoplasm, unspecified: Secondary | ICD-10-CM | POA: Diagnosis not present

## 2016-10-29 DIAGNOSIS — C787 Secondary malignant neoplasm of liver and intrahepatic bile duct: Secondary | ICD-10-CM

## 2016-10-29 DIAGNOSIS — R918 Other nonspecific abnormal finding of lung field: Secondary | ICD-10-CM

## 2016-10-29 DIAGNOSIS — R17 Unspecified jaundice: Secondary | ICD-10-CM | POA: Diagnosis not present

## 2016-10-29 DIAGNOSIS — C189 Malignant neoplasm of colon, unspecified: Secondary | ICD-10-CM

## 2016-10-29 DIAGNOSIS — D696 Thrombocytopenia, unspecified: Secondary | ICD-10-CM | POA: Diagnosis not present

## 2016-10-29 DIAGNOSIS — R152 Fecal urgency: Secondary | ICD-10-CM | POA: Diagnosis not present

## 2016-10-29 DIAGNOSIS — R1011 Right upper quadrant pain: Secondary | ICD-10-CM | POA: Diagnosis not present

## 2016-10-29 DIAGNOSIS — C19 Malignant neoplasm of rectosigmoid junction: Secondary | ICD-10-CM | POA: Diagnosis not present

## 2016-10-29 DIAGNOSIS — E871 Hypo-osmolality and hyponatremia: Secondary | ICD-10-CM | POA: Diagnosis not present

## 2016-10-29 DIAGNOSIS — E785 Hyperlipidemia, unspecified: Secondary | ICD-10-CM

## 2016-10-29 DIAGNOSIS — Z7984 Long term (current) use of oral hypoglycemic drugs: Secondary | ICD-10-CM | POA: Diagnosis not present

## 2016-10-29 DIAGNOSIS — K2970 Gastritis, unspecified, without bleeding: Secondary | ICD-10-CM

## 2016-10-29 DIAGNOSIS — D693 Immune thrombocytopenic purpura: Secondary | ICD-10-CM | POA: Diagnosis not present

## 2016-10-29 DIAGNOSIS — Z87891 Personal history of nicotine dependence: Secondary | ICD-10-CM | POA: Diagnosis not present

## 2016-10-29 DIAGNOSIS — Z5111 Encounter for antineoplastic chemotherapy: Secondary | ICD-10-CM | POA: Diagnosis not present

## 2016-10-29 DIAGNOSIS — Z79899 Other long term (current) drug therapy: Secondary | ICD-10-CM | POA: Diagnosis not present

## 2016-10-29 DIAGNOSIS — K2210 Ulcer of esophagus without bleeding: Secondary | ICD-10-CM | POA: Diagnosis not present

## 2016-10-29 LAB — CBC WITH DIFFERENTIAL/PLATELET
Basophils Absolute: 0.1 10*3/uL (ref 0–0.1)
Basophils Relative: 1 %
Eosinophils Absolute: 0.2 10*3/uL (ref 0–0.7)
Eosinophils Relative: 2 %
HCT: 30.6 % — ABNORMAL LOW (ref 40.0–52.0)
Hemoglobin: 10.7 g/dL — ABNORMAL LOW (ref 13.0–18.0)
Lymphocytes Relative: 10 %
Lymphs Abs: 1 10*3/uL (ref 1.0–3.6)
MCH: 31.1 pg (ref 26.0–34.0)
MCHC: 35 g/dL (ref 32.0–36.0)
MCV: 89 fL (ref 80.0–100.0)
Monocytes Absolute: 0.6 10*3/uL (ref 0.2–1.0)
Monocytes Relative: 6 %
Neutro Abs: 8.3 10*3/uL — ABNORMAL HIGH (ref 1.4–6.5)
Neutrophils Relative %: 81 %
Platelets: 130 10*3/uL — ABNORMAL LOW (ref 150–440)
RBC: 3.44 MIL/uL — ABNORMAL LOW (ref 4.40–5.90)
RDW: 14.3 % (ref 11.5–14.5)
WBC: 10.2 10*3/uL (ref 3.8–10.6)

## 2016-10-29 LAB — COMPREHENSIVE METABOLIC PANEL
ALT: 158 U/L — ABNORMAL HIGH (ref 17–63)
AST: 123 U/L — ABNORMAL HIGH (ref 15–41)
Albumin: 2.2 g/dL — ABNORMAL LOW (ref 3.5–5.0)
Alkaline Phosphatase: 1078 U/L — ABNORMAL HIGH (ref 38–126)
Anion gap: 6 (ref 5–15)
BUN: 13 mg/dL (ref 6–20)
CO2: 25 mmol/L (ref 22–32)
Calcium: 8.1 mg/dL — ABNORMAL LOW (ref 8.9–10.3)
Chloride: 93 mmol/L — ABNORMAL LOW (ref 101–111)
Creatinine, Ser: 0.34 mg/dL — ABNORMAL LOW (ref 0.61–1.24)
GFR calc Af Amer: >60 mL/min (ref >60)
GFR calc non Af Amer: >60 mL/min (ref >60)
Glucose, Bld: 161 mg/dL — ABNORMAL HIGH (ref 65–99)
Potassium: 3.3 mmol/L — ABNORMAL LOW (ref 3.5–5.1)
Sodium: 124 mmol/L — ABNORMAL LOW (ref 135–145)
Total Bilirubin: 14.1 mg/dL — ABNORMAL HIGH (ref 0.3–1.2)
Total Protein: 7.3 g/dL (ref 6.5–8.1)

## 2016-10-29 LAB — GLUCOSE, CAPILLARY: GLUCOSE-CAPILLARY: 89 mg/dL (ref 65–99)

## 2016-10-29 MED ORDER — OXALIPLATIN CHEMO INJECTION 100 MG/20ML
75.0000 mg/m2 | Freq: Once | INTRAVENOUS | Status: AC
  Administered 2016-10-29: 125 mg via INTRAVENOUS
  Filled 2016-10-29: qty 20

## 2016-10-29 MED ORDER — SODIUM CHLORIDE 0.9 % IV SOLN
INTRAVENOUS | Status: AC
  Administered 2016-10-29: 15:00:00 via INTRAVENOUS
  Filled 2016-10-29: qty 500

## 2016-10-29 MED ORDER — DEXTROSE 5 % IV SOLN
Freq: Once | INTRAVENOUS | Status: AC
  Administered 2016-10-29: 12:00:00 via INTRAVENOUS
  Filled 2016-10-29: qty 1000

## 2016-10-29 MED ORDER — SODIUM CHLORIDE 0.9 % IV SOLN
1200.0000 mg/m2 | INTRAVENOUS | Status: DC
  Filled 2016-10-29: qty 40

## 2016-10-29 MED ORDER — SODIUM CHLORIDE 0.9 % IV SOLN
20.0000 meq | Freq: Once | INTRAVENOUS | Status: DC
  Filled 2016-10-29: qty 10

## 2016-10-29 MED ORDER — DEXAMETHASONE SODIUM PHOSPHATE 10 MG/ML IJ SOLN
10.0000 mg | Freq: Once | INTRAMUSCULAR | Status: AC
  Administered 2016-10-29: 10 mg via INTRAVENOUS
  Filled 2016-10-29: qty 1

## 2016-10-29 MED ORDER — PALONOSETRON HCL INJECTION 0.25 MG/5ML
0.2500 mg | Freq: Once | INTRAVENOUS | Status: AC
  Administered 2016-10-29: 0.25 mg via INTRAVENOUS

## 2016-10-29 MED ORDER — SODIUM CHLORIDE 0.9% FLUSH
10.0000 mL | INTRAVENOUS | Status: DC | PRN
  Administered 2016-10-29: 10 mL via INTRAVENOUS
  Filled 2016-10-29: qty 10

## 2016-10-29 MED ORDER — SODIUM CHLORIDE 0.9 % IV SOLN: 10.0000 mg | Freq: Once | INTRAVENOUS | Status: DC

## 2016-10-29 MED ORDER — HEPARIN SOD (PORK) LOCK FLUSH 100 UNIT/ML IV SOLN
500.0000 [IU] | Freq: Once | INTRAVENOUS | Status: AC
  Administered 2016-10-29: 500 [IU] via INTRAVENOUS
  Filled 2016-10-29: qty 5

## 2016-10-29 MED ORDER — SODIUM CHLORIDE 0.9 % IV SOLN: INTRAVENOUS | Status: DC

## 2016-10-29 NOTE — Telephone Encounter (Signed)
Per Dr. Mike Gip, Oxaliplatin dose is to be reduced to 75mg /m2, No Leucovorin or 5FUbolus. Reduce by 50% of the 5Fu pump dose.

## 2016-10-29 NOTE — Telephone Encounter (Signed)
Pt and family wanted second opinion at [facility] or [facility] . Whoever could get appt the quickest for second opinion. Dr. Mike Gip called and spoke to Dr. Berneice Gandy and he will see pt tom.  We were given Fuller [place] the navigator number3 [phone].  I called her and she gave me 11/21 at 9:30. May want to arrive early to get from parking deck to the area where they check in. Address: [address] [place], [place] [address] 3rf floor clinic [address]2.  Phone number [phone]  Fax number to fax records was [phone] and 45 pages faxed and I faxed a request to radiology for power share so [facility] can read images and see the actual images . All the info was given to pat, wife, son with Kennyth Lose the translator giving all the info in [place]

## 2016-10-29 NOTE — Progress Notes (Signed)
Lab results reviewed. Total Bilirubin: 14.1, AST: 123, ALT: 158. MD, Dr. Mike Gip, notified via telephone and already aware. Per MD order: proceed with chemotherapy treatment today.

## 2016-10-29 NOTE — Progress Notes (Signed)
Patient here today as hospital follow up. Patient has added Boost to his intake, due to decreased appetite.  States he feels like he has to have a BM frequently, but when he goes he is unable to pass much.

## 2016-10-30 ENCOUNTER — Inpatient Hospital Stay: Payer: 59

## 2016-10-30 ENCOUNTER — Other Ambulatory Visit: Payer: Self-pay | Admitting: *Deleted

## 2016-10-30 DIAGNOSIS — C19 Malignant neoplasm of rectosigmoid junction: Secondary | ICD-10-CM | POA: Diagnosis not present

## 2016-10-31 ENCOUNTER — Inpatient Hospital Stay: Payer: 59

## 2016-10-31 ENCOUNTER — Ambulatory Visit: Payer: 59 | Admitting: Radiation Oncology

## 2016-10-31 ENCOUNTER — Inpatient Hospital Stay: Payer: 59 | Admitting: Hematology and Oncology

## 2016-11-02 DIAGNOSIS — E118 Type 2 diabetes mellitus with unspecified complications: Secondary | ICD-10-CM | POA: Diagnosis not present

## 2016-11-02 DIAGNOSIS — R17 Unspecified jaundice: Secondary | ICD-10-CM | POA: Diagnosis not present

## 2016-11-02 DIAGNOSIS — C7800 Secondary malignant neoplasm of unspecified lung: Secondary | ICD-10-CM | POA: Diagnosis not present

## 2016-11-02 DIAGNOSIS — K810 Acute cholecystitis: Secondary | ICD-10-CM | POA: Diagnosis not present

## 2016-11-02 DIAGNOSIS — R188 Other ascites: Secondary | ICD-10-CM | POA: Diagnosis not present

## 2016-11-02 DIAGNOSIS — C19 Malignant neoplasm of rectosigmoid junction: Secondary | ICD-10-CM | POA: Diagnosis not present

## 2016-11-02 DIAGNOSIS — C772 Secondary and unspecified malignant neoplasm of intra-abdominal lymph nodes: Secondary | ICD-10-CM | POA: Diagnosis not present

## 2016-11-02 DIAGNOSIS — C20 Malignant neoplasm of rectum: Secondary | ICD-10-CM | POA: Diagnosis not present

## 2016-11-02 DIAGNOSIS — D688 Other specified coagulation defects: Secondary | ICD-10-CM | POA: Diagnosis not present

## 2016-11-02 DIAGNOSIS — C787 Secondary malignant neoplasm of liver and intrahepatic bile duct: Secondary | ICD-10-CM | POA: Diagnosis not present

## 2016-11-02 DIAGNOSIS — K830 Cholangitis: Secondary | ICD-10-CM | POA: Diagnosis not present

## 2016-11-02 DIAGNOSIS — K831 Obstruction of bile duct: Secondary | ICD-10-CM | POA: Diagnosis not present

## 2016-11-02 DIAGNOSIS — R509 Fever, unspecified: Secondary | ICD-10-CM | POA: Diagnosis not present

## 2016-11-02 DIAGNOSIS — T859XXA Unspecified complication of internal prosthetic device, implant and graft, initial encounter: Secondary | ICD-10-CM | POA: Diagnosis not present

## 2016-11-02 DIAGNOSIS — A419 Sepsis, unspecified organism: Secondary | ICD-10-CM | POA: Diagnosis not present

## 2016-11-02 DIAGNOSIS — Z9689 Presence of other specified functional implants: Secondary | ICD-10-CM | POA: Diagnosis not present

## 2016-11-02 DIAGNOSIS — Z4659 Encounter for fitting and adjustment of other gastrointestinal appliance and device: Secondary | ICD-10-CM | POA: Diagnosis not present

## 2016-11-02 DIAGNOSIS — J9811 Atelectasis: Secondary | ICD-10-CM | POA: Diagnosis not present

## 2016-11-02 DIAGNOSIS — E1165 Type 2 diabetes mellitus with hyperglycemia: Secondary | ICD-10-CM | POA: Diagnosis not present

## 2016-11-05 ENCOUNTER — Inpatient Hospital Stay: Payer: 59 | Admitting: Hematology and Oncology

## 2016-11-05 ENCOUNTER — Inpatient Hospital Stay: Payer: 59

## 2016-11-05 LAB — CYTOLOGY - NON PAP

## 2016-11-13 DIAGNOSIS — R609 Edema, unspecified: Secondary | ICD-10-CM | POA: Diagnosis not present

## 2016-11-13 DIAGNOSIS — Z515 Encounter for palliative care: Secondary | ICD-10-CM | POA: Diagnosis not present

## 2016-11-13 DIAGNOSIS — G893 Neoplasm related pain (acute) (chronic): Secondary | ICD-10-CM | POA: Diagnosis not present

## 2016-11-13 DIAGNOSIS — Z7189 Other specified counseling: Secondary | ICD-10-CM | POA: Diagnosis not present

## 2016-11-20 ENCOUNTER — Other Ambulatory Visit: Payer: Self-pay

## 2016-11-20 DIAGNOSIS — E118 Type 2 diabetes mellitus with unspecified complications: Secondary | ICD-10-CM | POA: Diagnosis not present

## 2016-11-20 DIAGNOSIS — I10 Essential (primary) hypertension: Secondary | ICD-10-CM | POA: Insufficient documentation

## 2016-11-20 DIAGNOSIS — E1165 Type 2 diabetes mellitus with hyperglycemia: Secondary | ICD-10-CM | POA: Diagnosis not present

## 2016-11-20 DIAGNOSIS — Z5111 Encounter for antineoplastic chemotherapy: Secondary | ICD-10-CM | POA: Diagnosis not present

## 2016-11-20 DIAGNOSIS — G893 Neoplasm related pain (acute) (chronic): Secondary | ICD-10-CM | POA: Diagnosis not present

## 2016-11-20 DIAGNOSIS — E119 Type 2 diabetes mellitus without complications: Secondary | ICD-10-CM | POA: Diagnosis not present

## 2016-11-20 DIAGNOSIS — C19 Malignant neoplasm of rectosigmoid junction: Secondary | ICD-10-CM | POA: Diagnosis not present

## 2016-11-20 DIAGNOSIS — Z79891 Long term (current) use of opiate analgesic: Secondary | ICD-10-CM | POA: Diagnosis not present

## 2016-11-20 DIAGNOSIS — Z79899 Other long term (current) drug therapy: Secondary | ICD-10-CM | POA: Diagnosis not present

## 2016-11-20 DIAGNOSIS — C799 Secondary malignant neoplasm of unspecified site: Secondary | ICD-10-CM | POA: Diagnosis not present

## 2016-11-26 ENCOUNTER — Ambulatory Visit: Payer: 59 | Admitting: Surgery

## 2016-11-30 ENCOUNTER — Emergency Department: Payer: 59

## 2016-11-30 ENCOUNTER — Encounter: Payer: Self-pay | Admitting: Emergency Medicine

## 2016-11-30 ENCOUNTER — Inpatient Hospital Stay
Admission: EM | Admit: 2016-11-30 | Discharge: 2016-12-04 | DRG: 481 | Disposition: A | Discharge status: Skilled Nursing Facility | Payer: 59 | Attending: Internal Medicine | Admitting: Internal Medicine

## 2016-11-30 DIAGNOSIS — W182XXA Fall in (into) shower or empty bathtub, initial encounter: Secondary | ICD-10-CM | POA: Diagnosis present

## 2016-11-30 DIAGNOSIS — S299XXA Unspecified injury of thorax, initial encounter: Secondary | ICD-10-CM | POA: Diagnosis not present

## 2016-11-30 DIAGNOSIS — D539 Nutritional anemia, unspecified: Secondary | ICD-10-CM | POA: Diagnosis not present

## 2016-11-30 DIAGNOSIS — E785 Hyperlipidemia, unspecified: Secondary | ICD-10-CM | POA: Diagnosis present

## 2016-11-30 DIAGNOSIS — Z9221 Personal history of antineoplastic chemotherapy: Secondary | ICD-10-CM

## 2016-11-30 DIAGNOSIS — C19 Malignant neoplasm of rectosigmoid junction: Secondary | ICD-10-CM | POA: Diagnosis present

## 2016-11-30 DIAGNOSIS — I10 Essential (primary) hypertension: Secondary | ICD-10-CM | POA: Diagnosis not present

## 2016-11-30 DIAGNOSIS — S72002A Fracture of unspecified part of neck of left femur, initial encounter for closed fracture: Secondary | ICD-10-CM | POA: Diagnosis not present

## 2016-11-30 DIAGNOSIS — S72009A Fracture of unspecified part of neck of unspecified femur, initial encounter for closed fracture: Secondary | ICD-10-CM | POA: Diagnosis present

## 2016-11-30 DIAGNOSIS — R262 Difficulty in walking, not elsewhere classified: Secondary | ICD-10-CM

## 2016-11-30 DIAGNOSIS — M25552 Pain in left hip: Secondary | ICD-10-CM | POA: Diagnosis not present

## 2016-11-30 DIAGNOSIS — W19XXXA Unspecified fall, initial encounter: Secondary | ICD-10-CM

## 2016-11-30 DIAGNOSIS — M6281 Muscle weakness (generalized): Secondary | ICD-10-CM

## 2016-11-30 DIAGNOSIS — E871 Hypo-osmolality and hyponatremia: Secondary | ICD-10-CM | POA: Diagnosis present

## 2016-11-30 DIAGNOSIS — S7222XA Displaced subtrochanteric fracture of left femur, initial encounter for closed fracture: Secondary | ICD-10-CM | POA: Diagnosis not present

## 2016-11-30 DIAGNOSIS — E119 Type 2 diabetes mellitus without complications: Secondary | ICD-10-CM | POA: Diagnosis not present

## 2016-11-30 DIAGNOSIS — E876 Hypokalemia: Secondary | ICD-10-CM | POA: Diagnosis present

## 2016-11-30 DIAGNOSIS — C787 Secondary malignant neoplasm of liver and intrahepatic bile duct: Secondary | ICD-10-CM | POA: Diagnosis present

## 2016-11-30 DIAGNOSIS — Y92012 Bathroom of single-family (private) house as the place of occurrence of the external cause: Secondary | ICD-10-CM

## 2016-11-30 DIAGNOSIS — D696 Thrombocytopenia, unspecified: Secondary | ICD-10-CM | POA: Diagnosis not present

## 2016-11-30 DIAGNOSIS — Z9889 Other specified postprocedural states: Secondary | ICD-10-CM

## 2016-11-30 DIAGNOSIS — Z809 Family history of malignant neoplasm, unspecified: Secondary | ICD-10-CM

## 2016-11-30 DIAGNOSIS — Z79899 Other long term (current) drug therapy: Secondary | ICD-10-CM

## 2016-11-30 NOTE — ED Triage Notes (Signed)
Reports [facility] [place] [facility].  Patient complains of pain to right middle finger.

## 2016-11-30 NOTE — ED Provider Notes (Signed)
[facility] Emergency Department Provider Note        Time seen: ----------------------------------------- 11:39 PM on 11/30/2016 -----------------------------------------    I have reviewed the triage vital signs and the nursing notes.   HISTORY  Chief Complaint No chief complaint on file.    HPI Nathan Atkinson is a 65 y.o. male who presents to the ER after he fell at home. Family found him lying on the floor of the home and called EMS. There is obvious shortening and rotation of his left leg. Patient is Spanish-speaking, complains of left hip pain otherwise denies complaints.   Past Medical History:  Diagnosis Date  . Cataract 08/2015   forming  . Colorectal cancer ([place]) 10/08/2016  . Diabetes mellitus without complication ([place])   . Hyperlipidemia   . Hypertension   . Platelets decreased [facility])     Patient Active Problem List   Diagnosis Date Noted  . Hypertension 11/20/2016  . Protein-calorie malnutrition, severe 10/25/2016  . Hyperbilirubinemia 10/24/2016  . 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 Obstructive jaundice 10/24/2016  . Rectal cancer metastasized to liver ([place])   . Port-a-cath in place   . Colorectal cancer ([place]) 10/08/2016  . Liver metastases ([place]) 10/08/2016  . Diabetes mellitus type 2, uncontrolled ([place]) 05/09/2016  . Diabetes mellitus type 2, controlled ([place]) 09/21/2015    Past Surgical History:  Procedure Laterality Date  . CARDIAC CATHETERIZATION  2003  . COLONOSCOPY N/A 10/04/2016   Procedure: COLONOSCOPY;  Surgeon: Lollie Sails, MD;  Location: [facility] ENDOSCOPY;  Service: Endoscopy;  Laterality: N/A;  . ERCP N/A 10/25/2016   Procedure: ENDOSCOPIC RETROGRADE CHOLANGIOPANCREATOGRAPHY (ERCP);  Surgeon: Lucilla Lame, MD;  Location: [facility] ENDOSCOPY;  Service: Endoscopy;  Laterality: N/A;  . ESOPHAGOGASTRODUODENOSCOPY (EGD) WITH PROPOFOL N/A 10/04/2016   Procedure: ESOPHAGOGASTRODUODENOSCOPY (EGD) WITH PROPOFOL;   Surgeon: Lollie Sails, MD;  Location: [facility] ENDOSCOPY;  Service: Endoscopy;  Laterality: N/A;  . PORTACATH PLACEMENT Left 10/12/2016   Procedure: INSERTION PORT-A-CATH;  Surgeon: Olean Ree, MD;  Location: [facility] ORS;  Service: General;  Laterality: Left;    Allergies Patient has no known allergies.  Social History Social History  Substance Use Topics  . Smoking status: Former Smoker    Years: 10.00    Types: Cigarettes    Quit date: 06/15/1995  . Smokeless tobacco: Never Used  . Alcohol use 0.0 oz/week    Review of Systems Constitutional: Negative for fever. Cardiovascular: Negative for chest pain. Respiratory: Negative for shortness of breath. Gastrointestinal: Negative for abdominal pain, vomiting and diarrhea. Musculoskeletal: Positive left hip pain Skin: Negative for rash. Neurological: Negative for headaches, positive for weakness  10-point ROS otherwise negative.  ____________________________________________   PHYSICAL EXAM:  VITAL SIGNS: ED Triage Vitals  Enc Vitals Group     BP      Pulse      Resp      Temp      Temp src      SpO2      Weight      Height      Head Circumference      Peak Flow      Pain Score      Pain Loc      Pain Edu?      Excl. in [place]?     Constitutional: Alert and oriented. Mild distress Eyes: Conjunctivae are normal. PERRL. Normal extraocular movements. ENT   Head: Normocephalic and atraumatic.   Nose: No congestion/rhinnorhea.   Mouth/Throat: Mucous membranes are  moist.   Neck: No stridor. Cardiovascular: Normal rate, regular rhythm. No murmurs, rubs, or gallops. Respiratory: Normal respiratory effort without tachypnea nor retractions. Breath sounds are clear and equal bilaterally. No wheezes/rales/rhonchi. Gastrointestinal: Soft and nontender. Normal bowel sounds Musculoskeletal: Tenderness and severe pain with range of motion of the left hip, obvious shortening and external rotation of the left lower  extremity. Lower extremity edema is noted Neurologic:  Normal speech and language. No gross focal neurologic deficits are appreciated.  Skin:  Skin is warm, dry and intact. No rash noted. Psychiatric: Mood and affect are normal. Speech and behavior are normal.  ____________________________________________  EKG: Interpreted by me. Sinus tachycardia with a rate of 106 bpm, normal PR interval, normal QRS, normal QT, left axis deviation.  ____________________________________________  ED COURSE:  Pertinent labs & imaging results that were available during my care of the patient were reviewed by me and considered in my medical decision making (see chart for details). Clinical Course   Patient presents to the ER status post fall and likely hip fracture. We will assess with labs and imaging.  Procedures ____________________________________________   LABS (pertinent positives/negatives)  Labs Reviewed  PROTIME-INR - Abnormal; Notable for the following:       Result Value   Prothrombin Time 16.5 (*)    All other components within normal limits  CBC WITH DIFFERENTIAL/PLATELET  COMPREHENSIVE METABOLIC PANEL  TROPONIN I  URINALYSIS, COMPLETE (UACMP) WITH MICROSCOPIC  CK    RADIOLOGY Images were viewed by me  Chest x-ray, left hip x-ray  IMPRESSION: Displaced sub trochanteric fracture of the left femur. No Dislocation. IMPRESSION: No acute findings. ____________________________________________  FINAL ASSESSMENT AND PLAN  Fall, weakness, hip fracture  Plan: Patient with labs and imaging as dictated above. Patient presents to ER after mechanical fall and obvious hip fracture. We will discuss with orthopedics and hospitalist for admission.   Earleen Newport, MD   Note: This dictation was prepared with Dragon dictation. Any transcriptional errors that result from this process are unintentional    Earleen Newport, MD 12/01/16 [phone]

## 2016-11-30 NOTE — ED Triage Notes (Signed)
Pt arrives via EMS with an unwitnessed fall with possible left hip fracture with external rotation and swelling noted at the left hip; pedal pulses noted on both lower extremities; pt is from home

## 2016-12-01 ENCOUNTER — Inpatient Hospital Stay: Payer: 59

## 2016-12-01 ENCOUNTER — Encounter: Payer: Self-pay | Admitting: Emergency Medicine

## 2016-12-01 ENCOUNTER — Inpatient Hospital Stay: Payer: 59 | Admitting: Certified Registered Nurse Anesthetist

## 2016-12-01 ENCOUNTER — Encounter
Admission: EM | Disposition: A | Discharge status: Skilled Nursing Facility | Payer: Self-pay | Source: Home / Self Care | Attending: Internal Medicine

## 2016-12-01 ENCOUNTER — Emergency Department: Payer: 59

## 2016-12-01 DIAGNOSIS — R262 Difficulty in walking, not elsewhere classified: Secondary | ICD-10-CM | POA: Diagnosis not present

## 2016-12-01 DIAGNOSIS — S72009A Fracture of unspecified part of neck of unspecified femur, initial encounter for closed fracture: Secondary | ICD-10-CM | POA: Diagnosis present

## 2016-12-01 DIAGNOSIS — Y92012 Bathroom of single-family (private) house as the place of occurrence of the external cause: Secondary | ICD-10-CM | POA: Diagnosis not present

## 2016-12-01 DIAGNOSIS — S72002A Fracture of unspecified part of neck of left femur, initial encounter for closed fracture: Secondary | ICD-10-CM | POA: Diagnosis not present

## 2016-12-01 DIAGNOSIS — Z809 Family history of malignant neoplasm, unspecified: Secondary | ICD-10-CM | POA: Diagnosis not present

## 2016-12-01 DIAGNOSIS — E785 Hyperlipidemia, unspecified: Secondary | ICD-10-CM | POA: Diagnosis present

## 2016-12-01 DIAGNOSIS — E871 Hypo-osmolality and hyponatremia: Secondary | ICD-10-CM | POA: Diagnosis present

## 2016-12-01 DIAGNOSIS — E876 Hypokalemia: Secondary | ICD-10-CM | POA: Diagnosis present

## 2016-12-01 DIAGNOSIS — Z79899 Other long term (current) drug therapy: Secondary | ICD-10-CM | POA: Diagnosis not present

## 2016-12-01 DIAGNOSIS — S7222XA Displaced subtrochanteric fracture of left femur, initial encounter for closed fracture: Secondary | ICD-10-CM | POA: Diagnosis not present

## 2016-12-01 DIAGNOSIS — Z741 Need for assistance with personal care: Secondary | ICD-10-CM | POA: Diagnosis not present

## 2016-12-01 DIAGNOSIS — Z9181 History of falling: Secondary | ICD-10-CM | POA: Diagnosis not present

## 2016-12-01 DIAGNOSIS — W182XXA Fall in (into) shower or empty bathtub, initial encounter: Secondary | ICD-10-CM | POA: Diagnosis present

## 2016-12-01 DIAGNOSIS — Z7401 Bed confinement status: Secondary | ICD-10-CM | POA: Diagnosis not present

## 2016-12-01 DIAGNOSIS — C19 Malignant neoplasm of rectosigmoid junction: Secondary | ICD-10-CM | POA: Diagnosis not present

## 2016-12-01 DIAGNOSIS — E119 Type 2 diabetes mellitus without complications: Secondary | ICD-10-CM | POA: Diagnosis not present

## 2016-12-01 DIAGNOSIS — D696 Thrombocytopenia, unspecified: Secondary | ICD-10-CM | POA: Diagnosis present

## 2016-12-01 DIAGNOSIS — S7222XD Displaced subtrochanteric fracture of left femur, subsequent encounter for closed fracture with routine healing: Secondary | ICD-10-CM | POA: Diagnosis not present

## 2016-12-01 DIAGNOSIS — C787 Secondary malignant neoplasm of liver and intrahepatic bile duct: Secondary | ICD-10-CM | POA: Diagnosis present

## 2016-12-01 DIAGNOSIS — I471 Supraventricular tachycardia: Secondary | ICD-10-CM | POA: Diagnosis not present

## 2016-12-01 DIAGNOSIS — I10 Essential (primary) hypertension: Secondary | ICD-10-CM | POA: Diagnosis not present

## 2016-12-01 DIAGNOSIS — M25552 Pain in left hip: Secondary | ICD-10-CM | POA: Diagnosis not present

## 2016-12-01 DIAGNOSIS — Z9221 Personal history of antineoplastic chemotherapy: Secondary | ICD-10-CM | POA: Diagnosis not present

## 2016-12-01 DIAGNOSIS — C229 Malignant neoplasm of liver, not specified as primary or secondary: Secondary | ICD-10-CM | POA: Diagnosis not present

## 2016-12-01 DIAGNOSIS — S7222XS Displaced subtrochanteric fracture of left femur, sequela: Secondary | ICD-10-CM | POA: Diagnosis not present

## 2016-12-01 DIAGNOSIS — Z9889 Other specified postprocedural states: Secondary | ICD-10-CM | POA: Diagnosis not present

## 2016-12-01 DIAGNOSIS — M6281 Muscle weakness (generalized): Secondary | ICD-10-CM | POA: Diagnosis not present

## 2016-12-01 DIAGNOSIS — S72142A Displaced intertrochanteric fracture of left femur, initial encounter for closed fracture: Secondary | ICD-10-CM | POA: Diagnosis not present

## 2016-12-01 DIAGNOSIS — Z4789 Encounter for other orthopedic aftercare: Secondary | ICD-10-CM | POA: Diagnosis not present

## 2016-12-01 DIAGNOSIS — D539 Nutritional anemia, unspecified: Secondary | ICD-10-CM | POA: Diagnosis not present

## 2016-12-01 HISTORY — PX: INTRAMEDULLARY (IM) NAIL INTERTROCHANTERIC: SHX5875

## 2016-12-01 LAB — CBC WITH DIFFERENTIAL/PLATELET
BASOS PCT: 1 %
Basophils Absolute: 0.1 10*3/uL (ref 0–0.1)
Eosinophils Absolute: 0.1 10*3/uL (ref 0–0.7)
Eosinophils Relative: 1 %
HCT: 25.5 % — ABNORMAL LOW (ref 40.0–52.0)
HEMOGLOBIN: 8.9 g/dL — AB (ref 13.0–18.0)
Lymphocytes Relative: 7 %
Lymphs Abs: 0.6 10*3/uL — ABNORMAL LOW (ref 1.0–3.6)
MCH: 35.7 pg — ABNORMAL HIGH (ref 26.0–34.0)
MCHC: 34.8 g/dL (ref 32.0–36.0)
MCV: 102.6 fL — ABNORMAL HIGH (ref 80.0–100.0)
MONOS PCT: 6 %
Monocytes Absolute: 0.6 10*3/uL (ref 0.2–1.0)
NEUTROS PCT: 85 %
Neutro Abs: 7.8 10*3/uL — ABNORMAL HIGH (ref 1.4–6.5)
Platelets: 136 10*3/uL — ABNORMAL LOW (ref 150–440)
RBC: 2.48 MIL/uL — AB (ref 4.40–5.90)
RDW: 21.5 % — ABNORMAL HIGH (ref 11.5–14.5)
WBC: 9.1 10*3/uL (ref 3.8–10.6)

## 2016-12-01 LAB — URINALYSIS, COMPLETE (UACMP) WITH MICROSCOPIC
Glucose, UA: NEGATIVE mg/dL
KETONES UR: NEGATIVE mg/dL
LEUKOCYTES UA: NEGATIVE
Nitrite: NEGATIVE
PROTEIN: NEGATIVE mg/dL
Specific Gravity, Urine: 1.019 (ref 1.005–1.030)
pH: 6 (ref 5.0–8.0)

## 2016-12-01 LAB — LIPID PANEL
CHOL/HDL RATIO: 28.2 ratio
Cholesterol: 282 mg/dL — ABNORMAL HIGH (ref 0–200)
HDL: 10 mg/dL — AB (ref >40)
LDL CALC: 229 mg/dL — AB (ref 0–99)
Triglycerides: 214 mg/dL — ABNORMAL HIGH (ref <150)
VLDL: 43 mg/dL — AB (ref 0–40)

## 2016-12-01 LAB — GLUCOSE, CAPILLARY
GLUCOSE-CAPILLARY: 163 mg/dL — AB (ref 65–99)
GLUCOSE-CAPILLARY: 125 mg/dL — AB (ref 65–99)
GLUCOSE-CAPILLARY: 153 mg/dL — AB (ref 65–99)
Glucose-Capillary: 94 mg/dL (ref 65–99)
Glucose-Capillary: 221 mg/dL — ABNORMAL HIGH (ref 65–99)

## 2016-12-01 LAB — PROTIME-INR
INR: 1.32
Prothrombin Time: 16.5 seconds — ABNORMAL HIGH (ref 11.4–15.2)

## 2016-12-01 LAB — COMPREHENSIVE METABOLIC PANEL
ALBUMIN: 1.8 g/dL — AB (ref 3.5–5.0)
ALK PHOS: 607 U/L — AB (ref 38–126)
ALT: 62 U/L (ref 17–63)
AST: 97 U/L — ABNORMAL HIGH (ref 15–41)
Anion gap: 8 (ref 5–15)
BUN: 14 mg/dL (ref 6–20)
CALCIUM: 8 mg/dL — AB (ref 8.9–10.3)
CHLORIDE: 96 mmol/L — AB (ref 101–111)
CO2: 24 mmol/L (ref 22–32)
Creatinine, Ser: 0.45 mg/dL — ABNORMAL LOW (ref 0.61–1.24)
GFR calc Af Amer: >60 mL/min (ref >60)
GFR calc non Af Amer: >60 mL/min (ref >60)
GLUCOSE: 219 mg/dL — AB (ref 65–99)
Potassium: 3.4 mmol/L — ABNORMAL LOW (ref 3.5–5.1)
SODIUM: 128 mmol/L — AB (ref 135–145)
Total Bilirubin: 8.6 mg/dL — ABNORMAL HIGH (ref 0.3–1.2)
Total Protein: 6.6 g/dL (ref 6.5–8.1)

## 2016-12-01 LAB — HEMOGLOBIN AND HEMATOCRIT, BLOOD
HCT: 24.4 % — ABNORMAL LOW (ref 40.0–52.0)
HEMOGLOBIN: 8.4 g/dL — AB (ref 13.0–18.0)

## 2016-12-01 LAB — POTASSIUM: POTASSIUM: 4 mmol/L (ref 3.5–5.1)

## 2016-12-01 LAB — SURGICAL PCR SCREEN
MRSA, PCR: NEGATIVE
Staphylococcus aureus: NEGATIVE

## 2016-12-01 LAB — TROPONIN I: Troponin I: 0.03 ng/mL (ref <0.03)

## 2016-12-01 LAB — CK: CK TOTAL: 104 U/L (ref 49–397)

## 2016-12-01 LAB — SODIUM: SODIUM: 132 mmol/L — AB (ref 135–145)

## 2016-12-01 SURGERY — FIXATION, FRACTURE, INTERTROCHANTERIC, WITH INTRAMEDULLARY ROD
Anesthesia: General | Laterality: Left

## 2016-12-01 MED ORDER — LIDOCAINE HCL (CARDIAC) 20 MG/ML IV SOLN
INTRAVENOUS | Status: DC | PRN
  Administered 2016-12-01: 80 mg via INTRAVENOUS

## 2016-12-01 MED ORDER — NEOMYCIN-POLYMYXIN B GU 40-200000 IR SOLN
Status: DC | PRN
  Administered 2016-12-01: 2 mL

## 2016-12-01 MED ORDER — OXYCODONE HCL 5 MG PO TABS
5.0000 mg | ORAL_TABLET | ORAL | Status: DC | PRN
  Administered 2016-12-02 – 2016-12-04 (×12): 10 mg via ORAL
  Filled 2016-12-01 (×12): qty 2

## 2016-12-01 MED ORDER — MIDAZOLAM HCL 2 MG/2ML IJ SOLN
INTRAMUSCULAR | Status: DC | PRN
  Administered 2016-12-01: 1 mg via INTRAVENOUS

## 2016-12-01 MED ORDER — FENTANYL CITRATE (PF) 100 MCG/2ML IJ SOLN
INTRAMUSCULAR | Status: AC
  Filled 2016-12-01: qty 2

## 2016-12-01 MED ORDER — ZOLPIDEM TARTRATE 5 MG PO TABS: 5.0000 mg | ORAL_TABLET | Freq: Every evening | ORAL | Status: DC | PRN

## 2016-12-01 MED ORDER — MAGNESIUM CITRATE PO SOLN: 1.0000 | Freq: Once | ORAL | Status: DC | PRN

## 2016-12-01 MED ORDER — METOCLOPRAMIDE HCL 5 MG/ML IJ SOLN: 5.0000 mg | Freq: Three times a day (TID) | INTRAMUSCULAR | Status: DC | PRN

## 2016-12-01 MED ORDER — ONDANSETRON HCL 4 MG/2ML IJ SOLN
4.0000 mg | Freq: Once | INTRAMUSCULAR | Status: DC | PRN
Start: 2016-12-01 — End: 2016-12-01

## 2016-12-01 MED ORDER — MIDAZOLAM HCL 2 MG/2ML IJ SOLN
INTRAMUSCULAR | Status: AC
  Filled 2016-12-01: qty 2

## 2016-12-01 MED ORDER — PHENYLEPHRINE HCL 10 MG/ML IJ SOLN
INTRAMUSCULAR | Status: DC | PRN
  Administered 2016-12-01 (×5): 80 ug via INTRAVENOUS

## 2016-12-01 MED ORDER — LIDOCAINE 2% (20 MG/ML) 5 ML SYRINGE
INTRAMUSCULAR | Status: AC
  Filled 2016-12-01: qty 5

## 2016-12-01 MED ORDER — ACETAMINOPHEN 650 MG RE SUPP: 650.0000 mg | Freq: Four times a day (QID) | RECTAL | Status: DC | PRN

## 2016-12-01 MED ORDER — ONDANSETRON HCL 4 MG PO TABS: 4.0000 mg | ORAL_TABLET | Freq: Four times a day (QID) | ORAL | Status: DC | PRN

## 2016-12-01 MED ORDER — CEFAZOLIN SODIUM 1 G IJ SOLR
INTRAMUSCULAR | Status: AC
  Filled 2016-12-01: qty 20

## 2016-12-01 MED ORDER — ACETAMINOPHEN 10 MG/ML IV SOLN
INTRAVENOUS | Status: AC
  Filled 2016-12-01: qty 100

## 2016-12-01 MED ORDER — MENTHOL 3 MG MT LOZG
1.0000 | LOZENGE | OROMUCOSAL | Status: DC | PRN
  Filled 2016-12-01: qty 9

## 2016-12-01 MED ORDER — METOCLOPRAMIDE HCL 10 MG PO TABS: 5.0000 mg | ORAL_TABLET | Freq: Three times a day (TID) | ORAL | Status: DC | PRN

## 2016-12-01 MED ORDER — PROCHLORPERAZINE MALEATE 10 MG PO TABS
10.0000 mg | ORAL_TABLET | Freq: Four times a day (QID) | ORAL | Status: DC | PRN
  Filled 2016-12-01: qty 1

## 2016-12-01 MED ORDER — FENTANYL CITRATE (PF) 100 MCG/2ML IJ SOLN
25.0000 ug | INTRAMUSCULAR | Status: DC | PRN
  Administered 2016-12-01 (×2): 25 ug via INTRAVENOUS

## 2016-12-01 MED ORDER — INSULIN ASPART 100 UNIT/ML ~~LOC~~ SOLN
0.0000 [IU] | SUBCUTANEOUS | Status: DC
  Administered 2016-12-01: 5 [IU] via SUBCUTANEOUS
  Administered 2016-12-01 (×2): 3 [IU] via SUBCUTANEOUS
  Administered 2016-12-02: 2 [IU] via SUBCUTANEOUS
  Administered 2016-12-02: 3 [IU] via SUBCUTANEOUS
  Administered 2016-12-02: 2 [IU] via SUBCUTANEOUS
  Administered 2016-12-02: 3 [IU] via SUBCUTANEOUS
  Administered 2016-12-02: 2 [IU] via SUBCUTANEOUS
  Administered 2016-12-02 – 2016-12-03 (×4): 3 [IU] via SUBCUTANEOUS
  Administered 2016-12-04 (×2): 2 [IU] via SUBCUTANEOUS
  Administered 2016-12-04: 3 [IU] via SUBCUTANEOUS
  Filled 2016-12-01 (×5): qty 3
  Filled 2016-12-01: qty 2
  Filled 2016-12-01: qty 3
  Filled 2016-12-01: qty 2
  Filled 2016-12-01: qty 5
  Filled 2016-12-01 (×2): qty 3
  Filled 2016-12-01 (×2): qty 2

## 2016-12-01 MED ORDER — PHENOL 1.4 % MT LIQD
1.0000 | OROMUCOSAL | Status: DC | PRN
  Filled 2016-12-01: qty 177

## 2016-12-01 MED ORDER — MORPHINE SULFATE (PF) 4 MG/ML IV SOLN
2.0000 mg | INTRAVENOUS | Status: DC | PRN
  Administered 2016-12-01 (×3): 2 mg via INTRAVENOUS
  Filled 2016-12-01 (×3): qty 1

## 2016-12-01 MED ORDER — ONDANSETRON HCL 4 MG/2ML IJ SOLN
INTRAMUSCULAR | Status: AC
  Filled 2016-12-01: qty 2

## 2016-12-01 MED ORDER — ONDANSETRON HCL 4 MG/2ML IJ SOLN
INTRAMUSCULAR | Status: DC | PRN
Start: 2016-12-01 — End: 2016-12-01
  Administered 2016-12-01: 4 mg via INTRAVENOUS

## 2016-12-01 MED ORDER — METHOCARBAMOL 1000 MG/10ML IJ SOLN
500.0000 mg | Freq: Four times a day (QID) | INTRAVENOUS | Status: DC | PRN
  Filled 2016-12-01: qty 5

## 2016-12-01 MED ORDER — METHOCARBAMOL 500 MG PO TABS: 500.0000 mg | ORAL_TABLET | Freq: Four times a day (QID) | ORAL | Status: DC | PRN

## 2016-12-01 MED ORDER — ENSURE ENLIVE PO LIQD
237.0000 mL | Freq: Two times a day (BID) | ORAL | Status: DC
  Administered 2016-12-01: 237 mL via ORAL

## 2016-12-01 MED ORDER — PROPOFOL 10 MG/ML IV BOLUS
INTRAVENOUS | Status: AC
  Filled 2016-12-01: qty 20

## 2016-12-01 MED ORDER — SENNOSIDES-DOCUSATE SODIUM 8.6-50 MG PO TABS: 1.0000 | ORAL_TABLET | Freq: Every evening | ORAL | Status: DC | PRN

## 2016-12-01 MED ORDER — SODIUM CHLORIDE 0.9 % IV SOLN
30.0000 meq | Freq: Once | INTRAVENOUS | Status: AC
  Administered 2016-12-01: 30 meq via INTRAVENOUS
  Filled 2016-12-01 (×2): qty 15

## 2016-12-01 MED ORDER — BISACODYL 5 MG PO TBEC: 5.0000 mg | DELAYED_RELEASE_TABLET | Freq: Every day | ORAL | Status: DC | PRN

## 2016-12-01 MED ORDER — ALUM & MAG HYDROXIDE-SIMETH 200-200-20 MG/5ML PO SUSP
30.0000 mL | ORAL | Status: DC | PRN
Start: 2016-12-01 — End: 2016-12-04

## 2016-12-01 MED ORDER — SODIUM CHLORIDE 0.9 % IV SOLN
INTRAVENOUS | Status: DC
  Administered 2016-12-02: 04:00:00 via INTRAVENOUS

## 2016-12-01 MED ORDER — SUCCINYLCHOLINE CHLORIDE 200 MG/10ML IV SOSY
PREFILLED_SYRINGE | INTRAVENOUS | Status: AC
  Filled 2016-12-01: qty 10

## 2016-12-01 MED ORDER — CAPECITABINE 500 MG PO TABS: 1000.0000 mg | ORAL_TABLET | Freq: Two times a day (BID) | ORAL | Status: DC

## 2016-12-01 MED ORDER — POLYETHYLENE GLYCOL 3350 17 G PO PACK: 17.0000 g | PACK | Freq: Every day | ORAL | Status: DC

## 2016-12-01 MED ORDER — ROCURONIUM BROMIDE 100 MG/10ML IV SOLN
INTRAVENOUS | Status: DC | PRN
  Administered 2016-12-01: 20 mg via INTRAVENOUS

## 2016-12-01 MED ORDER — CEFAZOLIN SODIUM-DEXTROSE 2-4 GM/100ML-% IV SOLN
2.0000 g | Freq: Four times a day (QID) | INTRAVENOUS | Status: AC
  Administered 2016-12-01 – 2016-12-02 (×3): 2 g via INTRAVENOUS
  Filled 2016-12-01 (×3): qty 100

## 2016-12-01 MED ORDER — ACETAMINOPHEN 10 MG/ML IV SOLN
INTRAVENOUS | Status: DC | PRN
  Administered 2016-12-01: 1000 mg via INTRAVENOUS

## 2016-12-01 MED ORDER — HYDROCODONE-ACETAMINOPHEN 5-325 MG PO TABS: 1.0000 | ORAL_TABLET | Freq: Four times a day (QID) | ORAL | Status: DC | PRN

## 2016-12-01 MED ORDER — CEFAZOLIN SODIUM 1 G IJ SOLR
INTRAMUSCULAR | Status: DC | PRN
  Administered 2016-12-01: 2 g via INTRAMUSCULAR

## 2016-12-01 MED ORDER — DEXAMETHASONE SODIUM PHOSPHATE 10 MG/ML IJ SOLN
INTRAMUSCULAR | Status: AC
  Filled 2016-12-01: qty 1

## 2016-12-01 MED ORDER — PHENYLEPHRINE 40 MCG/ML (10ML) SYRINGE FOR IV PUSH (FOR BLOOD PRESSURE SUPPORT)
PREFILLED_SYRINGE | INTRAVENOUS | Status: AC
  Filled 2016-12-01: qty 10

## 2016-12-01 MED ORDER — DEXAMETHASONE SODIUM PHOSPHATE 10 MG/ML IJ SOLN
INTRAMUSCULAR | Status: DC | PRN
  Administered 2016-12-01: 5 mg via INTRAVENOUS

## 2016-12-01 MED ORDER — SODIUM CHLORIDE 0.9 % IJ SOLN
INTRAMUSCULAR | Status: AC
  Filled 2016-12-01: qty 20

## 2016-12-01 MED ORDER — SEVOFLURANE IN SOLN
RESPIRATORY_TRACT | Status: AC
  Filled 2016-12-01: qty 250

## 2016-12-01 MED ORDER — FENTANYL CITRATE (PF) 100 MCG/2ML IJ SOLN
INTRAMUSCULAR | Status: DC | PRN
  Administered 2016-12-01 (×2): 50 ug via INTRAVENOUS

## 2016-12-01 MED ORDER — PROPOFOL 10 MG/ML IV BOLUS
INTRAVENOUS | Status: DC | PRN
  Administered 2016-12-01: 100 mg via INTRAVENOUS

## 2016-12-01 MED ORDER — ENOXAPARIN SODIUM 40 MG/0.4ML ~~LOC~~ SOLN
40.0000 mg | SUBCUTANEOUS | Status: DC
  Administered 2016-12-02 – 2016-12-04 (×2): 40 mg via SUBCUTANEOUS
  Filled 2016-12-01 (×2): qty 0.4

## 2016-12-01 MED ORDER — ONDANSETRON HCL 4 MG/2ML IJ SOLN: 4.0000 mg | Freq: Four times a day (QID) | INTRAMUSCULAR | Status: DC | PRN

## 2016-12-01 MED ORDER — ENALAPRIL MALEATE 10 MG PO TABS
10.0000 mg | ORAL_TABLET | Freq: Every day | ORAL | Status: DC
  Administered 2016-12-01 – 2016-12-03 (×3): 10 mg via ORAL
  Filled 2016-12-01 (×3): qty 1

## 2016-12-01 MED ORDER — DOCUSATE SODIUM 100 MG PO CAPS
100.0000 mg | ORAL_CAPSULE | Freq: Two times a day (BID) | ORAL | Status: DC
  Administered 2016-12-01 – 2016-12-04 (×4): 100 mg via ORAL
  Filled 2016-12-01 (×5): qty 1

## 2016-12-01 MED ORDER — SUCCINYLCHOLINE CHLORIDE 20 MG/ML IJ SOLN
INTRAMUSCULAR | Status: DC | PRN
  Administered 2016-12-01: 100 mg via INTRAVENOUS

## 2016-12-01 MED ORDER — MORPHINE SULFATE (PF) 2 MG/ML IV SOLN: 0.5000 mg | INTRAVENOUS | Status: DC | PRN

## 2016-12-01 MED ORDER — MAGNESIUM HYDROXIDE 400 MG/5ML PO SUSP: 30.0000 mL | Freq: Every day | ORAL | Status: DC | PRN

## 2016-12-01 MED ORDER — SODIUM CHLORIDE 0.9 % IV SOLN
INTRAVENOUS | Status: DC
  Administered 2016-12-01: 75 mL/h via INTRAVENOUS
  Administered 2016-12-01: 03:00:00 via INTRAVENOUS

## 2016-12-01 MED ORDER — ACETAMINOPHEN 325 MG PO TABS
650.0000 mg | ORAL_TABLET | Freq: Four times a day (QID) | ORAL | Status: DC | PRN
  Administered 2016-12-02: 650 mg via ORAL
  Filled 2016-12-01: qty 2

## 2016-12-01 MED ORDER — PANTOPRAZOLE SODIUM 40 MG PO TBEC
80.0000 mg | DELAYED_RELEASE_TABLET | Freq: Every day | ORAL | Status: DC
  Administered 2016-12-01 – 2016-12-04 (×4): 80 mg via ORAL
  Filled 2016-12-01 (×4): qty 2

## 2016-12-01 MED ORDER — MORPHINE SULFATE (PF) 2 MG/ML IV SOLN
2.0000 mg | INTRAVENOUS | Status: DC | PRN
  Administered 2016-12-01 (×2): 2 mg via INTRAVENOUS
  Filled 2016-12-01 (×2): qty 1

## 2016-12-01 MED ORDER — FENTANYL CITRATE (PF) 100 MCG/2ML IJ SOLN
INTRAMUSCULAR | Status: AC
  Administered 2016-12-01: 25 ug via INTRAVENOUS
  Filled 2016-12-01: qty 2

## 2016-12-01 MED ORDER — CEFAZOLIN SODIUM-DEXTROSE 2-4 GM/100ML-% IV SOLN
2.0000 g | Freq: Once | INTRAVENOUS | Status: DC
  Filled 2016-12-01: qty 100

## 2016-12-01 MED ORDER — SUGAMMADEX SODIUM 200 MG/2ML IV SOLN
INTRAVENOUS | Status: DC | PRN
  Administered 2016-12-01: 125 mg via INTRAVENOUS

## 2016-12-01 MED ORDER — HEPARIN SODIUM (PORCINE) 5000 UNIT/ML IJ SOLN: 5000.0000 [IU] | Freq: Three times a day (TID) | INTRAMUSCULAR | Status: DC

## 2016-12-01 SURGICAL SUPPLY — 36 items
BIT DRILL 4.3MMS DISTAL GRDTED (BIT) ×1 IMPLANT
CANISTER SUCT 1200ML W/VALVE (MISCELLANEOUS) IMPLANT
CHLORAPREP W/TINT 26ML (MISCELLANEOUS) ×2 IMPLANT
DRAPE SHEET LG 3/4 BI-LAMINATE (DRAPES) ×2 IMPLANT
DRAPE SURG 17X11 SM STRL (DRAPES) IMPLANT
DRAPE U-SHAPE 47X51 STRL (DRAPES) IMPLANT
DRILL 4.3MMS DISTAL GRADUATED (BIT) ×2
DRSG OPSITE POSTOP 4X6 (GAUZE/BANDAGES/DRESSINGS) ×6 IMPLANT
ELECT REM PT RETURN 9FT ADLT (ELECTROSURGICAL) ×2
ELECTRODE REM PT RTRN 9FT ADLT (ELECTROSURGICAL) ×1 IMPLANT
GAUZE SPONGE 4X4 12PLY STRL (GAUZE/BANDAGES/DRESSINGS) IMPLANT
GLOVE BIOGEL PI IND STRL 9 (GLOVE) ×1 IMPLANT
GLOVE BIOGEL PI INDICATOR 9 (GLOVE) ×1
GLOVE SURG SYN 9.0  PF PI (GLOVE) ×4
GLOVE SURG SYN 9.0 PF PI (GLOVE) ×4 IMPLANT
GOWN SRG 2XL LVL 4 RGLN SLV (GOWNS) ×1 IMPLANT
GOWN STRL NON-REIN 2XL LVL4 (GOWNS) ×1
GOWN STRL REUS W/ TWL LRG LVL3 (GOWN DISPOSABLE) ×1 IMPLANT
GOWN STRL REUS W/TWL LRG LVL3 (GOWN DISPOSABLE) ×1
GUIDEPIN VERSANAIL DSP 3.2X444 ×4 IMPLANT
GUIDEWIRE BALL NOSE 100CM (WIRE) ×2 IMPLANT
IV NS 500ML (IV SOLUTION) ×1
IV NS 500ML BAXH (IV SOLUTION) ×1 IMPLANT
KIT RM TURNOVER STRD PROC AR (KITS) ×2 IMPLANT
MAT BLUE FLOOR 46X72 FLO (MISCELLANEOUS) ×2 IMPLANT
NAIL HIP FRA AFFIX 130X9X340 L (Nail) ×2 IMPLANT
NEEDLE FILTER BLUNT 18X 1/2SAF (NEEDLE) ×1
NEEDLE FILTER BLUNT 18X1 1/2 (NEEDLE) ×1 IMPLANT
PACK HIP COMPR (MISCELLANEOUS) ×2 IMPLANT
SCREW BONE CORTICAL 5.0X42 (Screw) ×2 IMPLANT
SCREW LAG HIP NAIL 10.5X95 (Screw) ×2 IMPLANT
STAPLER SKIN PROX 35W (STAPLE) ×2 IMPLANT
SUT VIC AB 1 CT1 36 (SUTURE) ×2 IMPLANT
SUT VIC AB 2-0 CT1 (SUTURE) ×2 IMPLANT
SYRINGE 10CC LL (SYRINGE) ×2 IMPLANT
TAPE MICROFOAM 4IN (TAPE) IMPLANT

## 2016-12-01 NOTE — Progress Notes (Signed)
Paged Dr. Rudene Christians with lab results. Will contact anesthesia. Discussed chemo medications with Dr. Rudene Christians, will hold med for today due to surgical procedure, plan to resume tomorrow.

## 2016-12-01 NOTE — H&P (Signed)
[place] @ [facility] Admission History and Physical [employer], D.O.   Patient Name: Nathan Atkinson MR#: [id number] Date of Birth: [birth year] Date of Admission: 11/30/2016  Referring MD/NP/PA: Dr. Jimmye Norman Primary Care Physician: Baltazar Apo, MD Patient coming from: Home  Chief Complaint: Fall  HPI: Nathan Atkinson is a 65 y.o. 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  Patient was in a usual state of health until this evening when the patient sustained a mechanical fall at home. He was brought to the hospital via ambulance complaining of left hip pain.  According to the patient's son he was diagnosed with colorectal cancer in November 2017 and has been undergoing chemotherapy since then. No surgical intervention has been performed. Patient's states that he has not had any cardiac history, no cardiac workup in the past.  Patient's son reports that his functional capacity is minimal secondary to weakness related to chemotherapy.   Otherwise there has been no change in status. Patient has been taking medication as prescribed and there has been no recent change in medication or diet.  There has been no recent illness, travel or sick contacts.    Patient denies fevers/chills, dizziness, chest pain, shortness of breath, N/V/C/D, abdominal pain, dysuria/frequency, changes in mental status.   Review of Systems:  CONSTITUTIONAL: No fever/chills, fatigue, headache. Positive global weakness, weight loss, constitutional symptoms since starting chemotherapy. EYES: No blurry or double vision. ENT: No tinnitus, postnasal drip, redness or soreness of the oropharynx. RESPIRATORY: No cough, dyspnea, wheeze, hemoptysis.  CARDIOVASCULAR: No chest pain, palpitations, syncope, orthopnea,   GASTROINTESTINAL: No nausea, vomiting, abdominal pain, constipation, diarrhea.  No hematemesis, melena or hematochezia. GENITOURINARY: No dysuria, frequency, hematuria. ENDOCRINE: No polyuria or nocturia. No heat or cold intolerance. HEMATOLOGY: No anemia, bruising, bleeding. INTEGUMENTARY: No rashes, ulcers, lesions. MUSCULOSKELETAL: No arthritis, gout, dyspnea. Positive left thigh and hip pain NEUROLOGIC: No numbness, tingling, ataxia, seizure-type activity, weakness. PSYCHIATRIC: No anxiety, depression, insomnia.   Past Medical History:  Diagnosis Date  . Cataract 08/2015   forming  . Colorectal cancer ([place]) 10/08/2016  . Diabetes mellitus without complication ([place])   . Hyperlipidemia   . Hypertension   . Platelets decreased ([place])     Past Surgical History:  Procedure Laterality Date  . CARDIAC CATHETERIZATION  2003  . COLONOSCOPY N/A 10/04/2016   Procedure: COLONOSCOPY;  Surgeon: Lollie Sails, MD;  Location: [facility] ENDOSCOPY;  Service: Endoscopy;  Laterality: N/A;  . ERCP N/A 10/25/2016   Procedure: ENDOSCOPIC RETROGRADE CHOLANGIOPANCREATOGRAPHY (ERCP);  Surgeon: Lucilla Lame, MD;  Location: [facility] ENDOSCOPY;  Service: Endoscopy;  Laterality: N/A;  . ESOPHAGOGASTRODUODENOSCOPY (EGD) WITH PROPOFOL N/A 10/04/2016   Procedure: ESOPHAGOGASTRODUODENOSCOPY (EGD) WITH PROPOFOL;  Surgeon: Lollie Sails, MD;  Location: [facility] ENDOSCOPY;  Service: Endoscopy;  Laterality: N/A;  . PORTACATH PLACEMENT Left 10/12/2016   Procedure: INSERTION PORT-A-CATH;  Surgeon: Olean Ree, MD;  Location: [facility] ORS;  Service: General;  Laterality: Left;     reports that he quit smoking about 21 years ago. His smoking use included Cigarettes. He quit after 10.00 years of use. He has never used smokeless tobacco. He reports that he does not drink alcohol or use drugs.  No Known Allergies  Family History  Problem Relation Age of Onset  . Cancer Brother     not sure what kind   Family history has  been reviewed and confirmed with patient.   Prior to Admission medications   Medication Sig Start Date End Date Taking? Authorizing Provider  capecitabine (XELODA) 500 MG tablet Take 2 tab (1000 mg) in AM and 3 tab (1500 mg) in PM (12 hours apart) by mouth with water AFTER meal days 1-14. OFF days 15-21 10/30/16   Historical Provider, MD  dexamethasone (DECADRON) 4 MG tablet Take 8mg  (2 x 4mg  tablets) by mouth in the morning for 2 days after the first day of each cycle, then as directed. 10/30/16   Historical Provider, MD  enalapril (VASOTEC) 10 MG tablet Take 10 mg by mouth at bedtime.     Historical Provider, MD  feeding supplement, ENSURE ENLIVE, (ENSURE ENLIVE) LIQD Take 237 mLs by mouth 2 (two) times daily between meals. 10/27/16   Gladstone Lighter, MD  lidocaine-prilocaine (EMLA) cream Apply cream 1 hour before chemotherapy treatment 10/22/16   Lequita Asal, MD  omeprazole (PRILOSEC) 20 MG capsule Take 1 capsule (20 mg total) by mouth daily. 10/11/16   Olean Ree, MD  ondansetron (ZOFRAN) 8 MG tablet Take 1 tablet (8 mg) by mouth every 12 hours 30 minutes prior to each dose of oral chemotherapy to prevent nausea. 10/30/16   Historical Provider, MD  oxyCODONE (OXY IR/ROXICODONE) 5 MG immediate release tablet Take 1-2 tablets (5-10 mg total) by mouth every 4 (four) hours as needed for moderate pain or severe pain (mild pain). 10/27/16   Gladstone Lighter, MD  Polyethylene Glycol POWD Take 17 g by mouth daily. 10/11/16   Olean Ree, MD  prochlorperazine (COMPAZINE) 10 MG tablet Take by mouth. 10/30/16   Historical Provider, MD  Per patient's son he is only taking chemotherapy by mouth and not taking any other medications.  Physical Exam: Vitals:   12/01/16 0000 12/01/16 0004 12/01/16 0005 12/01/16 0030  BP: 128/72 128/72  122/79  Pulse: (!) 105 (!) 105  (!) 102  Resp:  18  11  Temp:  98.4 F (36.9 C)    TempSrc:  Oral    SpO2: 97% 99%  95%  Weight:   61.2 kg (135 lb)   Height:    5\' 3"  (1.6 m)     GENERAL: 65 y.o.-year-old Hispanic male patient, well-developed, thin, frail lying in the bed in no acute distress.  Pleasant and cooperative.   HEENT: Head atraumatic, normocephalic. Pupils equal, round, reactive to light and accommodation. Positive scleral icterus. Extraocular muscles intact. Nares are patent. Oropharynx is clear. Mucus membranes moist. NECK: Supple, full range of motion. No JVD, no bruit heard. No thyroid enlargement, no tenderness, no cervical lymphadenopathy. CHEST: Normal breath sounds bilaterally. No wheezing, rales, rhonchi or crackles. No use of accessory muscles of respiration.  No reproducible chest wall tenderness.  CARDIOVASCULAR: S1, S2 normal. No murmurs, rubs, or gallops. Cap refill <2 seconds. Pulses intact distally.  ABDOMEN: Soft, nondistended, nontender, . No rebound, guarding, rigidity. Normoactive bowel sounds present in all four quadrants. No organomegaly or mass. EXTREMITIES: Left lower extremity with tenderness to palpation over the hip, shortened and externally rotated. Positive bilateral lower extremity pitting edema to the ankles. NEUROLOGIC: Cranial nerves II through XII are grossly intact with no focal sensorimotor deficit. Muscle strength 5/5 in all extremities. Sensation intact. Gait not checked. SKIN: Warm, dry, and intact without obvious rash, lesion, or ulcer.   Labs on Admission:   CBC:  Recent Labs Lab 12/01/16 0029  WBC 9.1  NEUTROABS 7.8*  HGB 8.9*  HCT 25.5*  MCV  102.6*  PLT [id number]*   Basic Metabolic Panel:  Recent Labs Lab 12/01/16 0029  NA 128*  K 3.4*  CL 96*  CO2 24  GLUCOSE 219*  BUN 14  CREATININE 0.45*  CALCIUM 8.0*   GFR: Estimated Creatinine Clearance: 76.1 mL/min (by C-G formula based on SCr of 0.45 mg/dL (L)). Liver Function Tests:  Recent Labs Lab 12/01/16 0029  AST 97*  ALT 62  ALKPHOS 607*  BILITOT 8.6*  PROT 6.6  ALBUMIN 1.8*   Coagulation Profile:  Recent Labs Lab  12/01/16 0029  INR 1.32   Cardiac Enzymes:  Recent Labs Lab 12/01/16 0029  CKTOTAL 104  TROPONINI 0.03*   Radiological Exams on Admission: Dg Chest 1 View  Result Date: 12/01/2016 CLINICAL DATA:  Unwitnessed fall.  Left hip fracture EXAM: CHEST 1 VIEW COMPARISON:  10/12/2016 FINDINGS: There is a left subclavian port extending into the right atrium. Mild linear scarring or atelectasis in the left base. No consolidation. No effusion. No pneumothorax. IMPRESSION: No acute findings. Electronically Signed   By: Andreas Newport M.D.   On: 12/01/2016 00:01   Dg Hip Unilat W Or W/o Pelvis 2-3 Views Left  Result Date: 12/01/2016 CLINICAL DATA:  65 year old male with fall and left hip pain. History of metastatic colorectal carcinoma. EXAM: DG HIP (WITH OR WITHOUT PELVIS) 2-3V LEFT COMPARISON:  CT of the abdomen pelvis dated 10/23/2016 FINDINGS: There is a displaced oblique subtrochanteric fracture of the left femur with mild varus angulation. There is proximal migration of the femoral shaft in relation to the head of the femur. The femoral head remains in anatomic alignment with the acetabulum. No other acute fracture identified. There is no dislocation. The bones are osteopenic. There is soft tissue swelling of the left hip. Large amount of stool noted throughout the colon. IMPRESSION: Displaced sub trochanteric fracture of the left femur. No dislocation. Electronically Signed   By: Anner Crete M.D.   On: 12/01/2016 00:05   Dg Femur Min 2 Views Left  Result Date: 12/01/2016 CLINICAL DATA:  Fall and hip pain EXAM: LEFT FEMUR 2 VIEWS COMPARISON:  None. FINDINGS: There is an acute oblique fracture of the proximal left femur, just below the intertrochanteric line. The femoral head remains situated within the acetabular cup. The remainder of the femur is normal without focal osseous lesion. IMPRESSION: Oblique fracture of the proximal left femur, just below the intertrochanteric line with mild  lateral displacement. No other focal osseous abnormality of the left femur. Electronically Signed   By: Ulyses Jarred M.D.   On: 12/01/2016 01:39    EKG: Sinus tachycardia 106 with leftward axis and nonspecific ST-T wave changes.   Assessment/Plan Active Problems:   Hip fracture [facility])    This is a 65 y.o. 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 Displaced subtrochanteric fracture of the left femur -Admit to inpatient, surgical management per ortho -Medical optimization preoperatively -Given patient's history of diabetes, poor functional capacity we will request preoperative cardiovascular evaluation. -Check lipids, A1c  2. Hyponatremia and hypokalemia, chronic likely related to decreased by mouth intake -IV normal saline with potassium  3. Macrocytic anemia which has developed in the past month, likely related to chemotherapy -Repeat CBC in a.m. -Consider transfusion preoperatively if hemoglobin drops.  4. Thrombocytopenia, chronic -Monitor CBC especially with respect to DVT prophylaxis  5. History of metastatic colorectal cancer -Continue by mouth chemotherapy  6. History of diabetes -  Accu-Cheks every 4 hours with regular insulin sliding scale coverage  7. History of hypertension and hyperlipidemia, not currently medicated -Monitor blood pressure   Admission status: Inpatient IV Fluids: IV normal saline with potassium Diet/Nutrition: Nothing by mouth Consults called: Cardiology  DVT Px: Heparin SCDs and early ambulation Code Status: Full Code  Disposition Plan: To be determined following or   All the records are reviewed and case discussed with ED provider. Management plans discussed with the patient and/or family who express understanding and agree with plan of care.  Faelyn Sigler D.O. on 12/01/2016 at 1:58 AM Between 7am to 6pm - Pager - [phone] After 6pm  go to [email or web address] - [employer] [facility] Office [phone] CC: Primary care physician; Baltazar Apo, MD   12/01/2016, 1:58 AM

## 2016-12-01 NOTE — Progress Notes (Signed)
Initial Nutrition Assessment  DOCUMENTATION CODES:   Not applicable  INTERVENTION:  1. Monitor for needs, PO intake, Need for ONS following surgery  NUTRITION DIAGNOSIS:   Inadequate oral intake related to inability to eat as evidenced by NPO status.  GOAL:   Patient will meet greater than or equal to 90% of their needs  MONITOR:   PO intake, Diet advancement, I & O's, Labs, Weight trends  REASON FOR ASSESSMENT:   Consult Hip fracture protocol  ASSESSMENT:   Nathan Atkinson is a 65 y.o. 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   Patient in surgery during visit. RD will follow up Exhibits a 12#/8% severe wt loss over 2 months. NPO thus far. Per RN, pt was hungry this morning Unable to complete Nutrition-Focused physical exam at this time.  Labs and medications reviewed: CBGs 94-125, Na 128-132, Triglycerides 214 NS @ 32mL/hr  Diet Order:  Diet NPO time specified  Skin:  Reviewed, no issues  Last BM:  12/22  Height:   Ht Readings from Last 1 Encounters:  12/01/16 5\' 3"  (1.6 m)    Weight:   Wt Readings from Last 1 Encounters:  12/01/16 135 lb (61.2 kg)    Ideal Body Weight:  56.36 kg  BMI:  Body mass index is 23.91 kg/m.  Estimated Nutritional Needs:   Kcal:  1530-1840 calories  Protein:  61-74 gm  Fluid:  >/= 1.5L  EDUCATION NEEDS:   No education needs identified at this time  Nathan Anis. Jalaya Sarver, MS, RD LDN Inpatient Clinical Dietitian Pager [phone]

## 2016-12-01 NOTE — Op Note (Signed)
11/30/2016 - 12/01/2016  2:30 PM  PATIENT:  Nathan Atkinson  65 y.o. male  PRE-OPERATIVE DIAGNOSIS:  left hip fracture subtrochanteric  POST-OPERATIVE DIAGNOSIS:  same  PROCEDURE:  Procedure(s): INTRAMEDULLARY (IM) NAIL INTERTROCHANTRIC (Left) subtrochanteric proximal femur fracture  SURGEON: Laurene Footman, MD  ASSISTANTS: None  ANESTHESIA:   general  EBL:  Total I/O In: 1000 [I.V.:1000] Out: 1200 [Urine:1000; Blood:200]  BLOOD ADMINISTERED:none  DRAINS: none   LOCAL MEDICATIONS USED:  NONE  SPECIMEN:  No Specimen  DISPOSITION OF SPECIMEN:  N/A  COUNTS:  YES  TOURNIQUET:  * No tourniquets in log *  IMPLANTS: Biomet affixes 9 x 3 40 rod with 95 mm lag screw 42 mm distal interlocking screw  DICTATION: .Dragon Dictation patient brought the operating room and after adequate general anesthesia was obtained the right leg was placed in the well-leg boot and left leg in the traction with traction applied there was fairly good reduction still rotatory deformity and some flexion of the proximal fragment. The hip was then prepped and draped using a Barrier drape method and appropriate patient identification and timeout procedures were completed. Incision was made at the level of the fracture site and the reduction clamp used to get the fracture in a better alignment with non-scrubbed circulator rotating the leg to get the rotation correct. Next the incision was made proximally and a guide were inserted into the tip of the greater trochanter with proximal reaming carried out the guidewire was then passed down the femur and reaming of 11 mm was performed with a 9 x 3 3040 rod then inserted. When it was sent to the appropriate level guidewires inserted into near center center position of the head and a second guidewire through the antirotation screw hole to prevent rotation of the head as the screw was inserted measurement was made and it was drilled to 95 mm and then a 95 mm  screw placed proximally locking screw was performed with a quarter turn to loosen to allow for any potential compression although the subdural fractures were fixed angle device. Traction was released from the leg after this portion of the procedure was done to allow compression at the fracture and near anatomic alignment was obtained. The distal interlocking screw was then placed through the slotted hole and midportion to allow for possible compression small incision was made after perfect circles obtained drilling carried out to 42 mm locking screw inserted. The wounds were then thoroughly irrigated with #1 Vicryl to close the vastus lateralis fascia and IT band over the lateral incision and proximally the deep fascia followed by 2-0 Vicryl subcutaneously and skin staples for all incisions. Honeycomb dressings were then applied  PLAN OF CARE: Continue as inpatient  PATIENT DISPOSITION:  PACU - hemodynamically stable.

## 2016-12-01 NOTE — Progress Notes (Signed)
Spoke with MD. Pt is cleared for surgery by Cardiology. Spoke with Ivin Booty, RN in [place]. Per anesthesiologist, pt sodium level needs to be at least 132 to proceed with surgery. Communicated this to Dr. Rudene Christians. Labs ordered.  Pt asking about chemo medication, which is to be taken daily. Pt is currently NPO, has own medication with family. Will discuss with MD when paged for lab results.

## 2016-12-01 NOTE — Progress Notes (Signed)
[facility] [place] NAME: Nathan Atkinson    MR#:  [id number]  DATE OF BIRTH:  [birth year]  SUBJECTIVE:   via [place] interpreter. Son in the room. Patient admitted after he had a mechanical fall at home. Found to have hip fracture. He is awaiting surgery. Cardiology clearance obtained. Denies any complaints. REVIEW OF SYSTEMS:   Review of Systems  Constitutional: Negative for chills, fever and weight loss.  HENT: Negative for ear discharge, ear pain and nosebleeds.   Eyes: Negative for blurred vision, pain and discharge.  Respiratory: Negative for sputum production, shortness of breath, wheezing and stridor.   Cardiovascular: Negative for chest pain, palpitations, orthopnea and PND.  Gastrointestinal: Negative for abdominal pain, diarrhea, nausea and vomiting.  Genitourinary: Negative for frequency and urgency.  Musculoskeletal: Positive for joint pain. Negative for back pain.  Neurological: Negative for sensory change, speech change, focal weakness and weakness.  Psychiatric/Behavioral: Negative for depression and hallucinations. The patient is not nervous/anxious.    Tolerating Diet: Tolerating PT:   DRUG ALLERGIES:  No Known Allergies  VITALS:  Blood pressure (!) 109/56, pulse 91, temperature 98.6 F (37 C), temperature source Oral, resp. rate 18, height 5\' 3"  (1.6 m), weight 61.2 kg (135 lb), SpO2 98 %.  PHYSICAL EXAMINATION:   Physical Exam  GENERAL:  65 y.o.-year-old patient lying in the bed with no acute distress.  EYES: Pupils equal, round, reactive to light and accommodation. No scleral icterus. Extraocular muscles intact.  HEENT: Head atraumatic, normocephalic. Oropharynx and nasopharynx clear.  NECK:  Supple, no jugular venous distention. No thyroid enlargement, no tenderness.  LUNGS: Normal breath sounds bilaterally, no wheezing, rales, rhonchi. No use of accessory muscles of respiration.   CARDIOVASCULAR: S1, S2 normal. No murmurs, rubs, or gallops.  ABDOMEN: Soft, nontender, nondistended. Bowel sounds present. No organomegaly or mass.  EXTREMITIES: No cyanosis, clubbing or edema b/l.    NEUROLOGIC: Cranial nerves II through XII are intact. No focal Motor or sensory deficits b/l.   PSYCHIATRIC:  patient is alert and oriented x 3.  SKIN: No obvious rash, lesion, or ulcer.   LABORATORY PANEL:  CBC  Recent Labs Lab 12/01/16 0029  WBC 9.1  HGB 8.9*  HCT 25.5*  PLT 136*    Chemistries   Recent Labs Lab 12/01/16 0029 12/01/16 1005  NA 128* 132*  K 3.4* 4.0  CL 96*  --   CO2 24  --   GLUCOSE 219*  --   BUN 14  --   CREATININE 0.45*  --   CALCIUM 8.0*  --   AST 97*  --   ALT 62  --   ALKPHOS 607*  --   BILITOT 8.6*  --    Cardiac Enzymes  Recent Labs Lab 12/01/16 0029  TROPONINI 0.03*   RADIOLOGY:  Dg Chest 1 View  Result Date: 12/01/2016 CLINICAL DATA:  Unwitnessed fall.  Left hip fracture EXAM: CHEST 1 VIEW COMPARISON:  10/12/2016 FINDINGS: There is a left subclavian port extending into the right atrium. Mild linear scarring or atelectasis in the left base. No consolidation. No effusion. No pneumothorax. IMPRESSION: No acute findings. Electronically Signed   By: Andreas Newport M.D.   On: 12/01/2016 00:01   Dg Hip Unilat W Or W/o Pelvis 2-3 Views Left  Result Date: 12/01/2016 CLINICAL DATA:  65 year old male with fall and left hip pain. History of metastatic colorectal carcinoma. EXAM: DG HIP (WITH OR WITHOUT PELVIS) 2-3V LEFT COMPARISON:  CT of the abdomen pelvis dated 10/23/2016 FINDINGS: There is a displaced oblique subtrochanteric fracture of the left femur with mild varus angulation. There is proximal migration of the femoral shaft in relation to the head of the femur. The femoral head remains in anatomic alignment with the acetabulum. No other acute fracture identified. There is no dislocation. The bones are osteopenic. There is soft tissue  swelling of the left hip. Large amount of stool noted throughout the colon. IMPRESSION: Displaced sub trochanteric fracture of the left femur. No dislocation. Electronically Signed   By: Anner Crete M.D.   On: 12/01/2016 00:05   Dg Femur Min 2 Views Left  Result Date: 12/01/2016 CLINICAL DATA:  Fall and hip pain EXAM: LEFT FEMUR 2 VIEWS COMPARISON:  None. FINDINGS: There is an acute oblique fracture of the proximal left femur, just below the intertrochanteric line. The femoral head remains situated within the acetabular cup. The remainder of the femur is normal without focal osseous lesion. IMPRESSION: Oblique fracture of the proximal left femur, just below the intertrochanteric line with mild lateral displacement. No other focal osseous abnormality of the left femur. Electronically Signed   By: Ulyses Jarred M.D.   On: 12/01/2016 01:39   ASSESSMENT AND PLAN:   65 y.o. 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 Displaced subtrochanteric fracture of the left femur - surgical management per ortho -Medical optimization preoperatively--patient has been seen by cardiology.  2. Hyponatremia and hypokalemia, chronic likely related to decreased by mouth intake -IV normal saline with potassium - sodium was 128 improved to 132   3. Macrocytic anemia which has developed in the past month, likely related to chemotherapy -Consider transfusion preoperatively if hemoglobin drops.  4. Thrombocytopenia, chronic -Monitor CBC especially with respect to DVT prophylaxis  5. History of metastatic colorectal cancer -Continue by mouth chemotherapy From tomorrow  6. History of diabetes -Accu-Cheks every 4 hours with regular insulin sliding scale coverage  7. History of hypertension and hyperlipidemia, not currently medicated -Monitor blood pressure  Case discussed with Care Management/Social  Worker. Management plans discussed with the patient, family and they are in agreement.  CODE STATUS: Full  DVT Prophylaxis: After surgery  TOTAL TIME TAKING CARE OF THIS PATIENT: 30  minutes.  >50% time spent on counselling and coordination of care  POSSIBLE D/C [facility] to 2  DAYS, DEPENDING ON CLINICAL CONDITION.  Note: This dictation was prepared with Dragon dictation along with smaller phrase technology. Any transcriptional errors that result from this process are unintentional.  Tyrica Afzal M.D on 12/01/2016 at 12:56 PM  Between 7am to 6pm - Pager - [phone]  After 6pm go to [email or web address] - password [email or web address] [facility]  Office  [phone]  CC: Primary care physician; Baltazar Apo, MD

## 2016-12-01 NOTE — Consult Note (Signed)
[facility] Cardiology Consultation Note  Patient ID: Nathan Atkinson, MRN: [id number], DOB/AGE: [birth year] 65 y.o. Admit date: 11/30/2016   Date of Consult: 12/01/2016 Primary Physician: Baltazar Apo, MD Primary Cardiologist:None  Chief Complaint:  Chief Complaint  Patient presents with  . Fall   Reason for Consult: tachycardia with cardiovascular risk factors  HPI: 65 y.o. male with known diabetes without evidence of previous complication essential hypertension and mixed hyperlipidemia on appropriate medication management with insulin injection for which showed the patient has done fairly well. He has no history of cardiovascular disease heart attack or congestive heart failure. The patient has had reasonable exercise tolerance recently and has not evidence of anginal symptoms and/or congestive heart failure. Recently he has had treatment of cancer and has tolerated this fairly well. Apparently he fell in the shower and fractured his hips. Other than the pain of his hip he is had no other significant symptoms suggesting other medical problems. The patient does have an EKG today showing normal sinus rhythm and no evidence of changes concerning at this time. Additionally the patient did have some mild tachycardia by telemetry most consistent with stress at this time. There is been no evidence of myocardial infarction with no elevation of troponin at this time. Therefore the patient is at lowest risk possible for cardiovascular complication with surgical intervention of his broken hip  Past Medical History:  Diagnosis Date  . Cataract 08/2015   forming  . Colorectal cancer ([place]) 10/08/2016  . Diabetes mellitus without complication ([place])   . Hyperlipidemia   . Hypertension   . Platelets decreased ([place])       Surgical History:  Past Surgical History:  Procedure Laterality Date  . CARDIAC CATHETERIZATION  2003  . COLONOSCOPY N/A 10/04/2016   Procedure: COLONOSCOPY;  Surgeon:  Lollie Sails, MD;  Location[facility] ENDOSCOPY;  Service: Endoscopy;  Laterality: N/A;  . ERCP N/A 10/25/2016   Procedure: ENDOSCOPIC RETROGRADE CHOLANGIOPANCREATOGRAPHY (ERCP);  Surgeon: Lucilla Lame, MD;  Location: [facility] ENDOSCOPY;  Service: Endoscopy;  Laterality: N/A;  . ESOPHAGOGASTRODUODENOSCOPY (EGD) WITH PROPOFOL N/A 10/04/2016   Procedure: ESOPHAGOGASTRODUODENOSCOPY (EGD) WITH PROPOFOL;  Surgeon: Lollie Sails, MD;  Location: [facility] ENDOSCOPY;  Service: Endoscopy;  Laterality: N/A;  . PORTACATH PLACEMENT Left 10/12/2016   Procedure: INSERTION PORT-A-CATH;  Surgeon: Olean Ree, MD;  Location: [facility] ORS;  Service: General;  Laterality: Left;     Home Meds: Prior to Admission medications   Medication Sig Start Date End Date Taking? Authorizing Provider  capecitabine (XELODA) 500 MG tablet Take 2 tab (1000 mg) in AM and 3 tab (1500 mg) in PM (12 hours apart) by mouth with water AFTER meal days 1-14. OFF days 15-21 10/30/16   Historical Provider, MD  dexamethasone (DECADRON) 4 MG tablet Take 8mg  (2 x 4mg  tablets) by mouth in the morning for 2 days after the first day of each cycle, then as directed. 10/30/16   Historical Provider, MD  enalapril (VASOTEC) 10 MG tablet Take 10 mg by mouth at bedtime.     Historical Provider, MD  feeding supplement, ENSURE ENLIVE, (ENSURE ENLIVE) LIQD Take 237 mLs by mouth 2 (two) times daily between meals. 10/27/16   Gladstone Lighter, MD  lidocaine-prilocaine (EMLA) cream Apply cream 1 hour before chemotherapy treatment 10/22/16   Lequita Asal, MD  omeprazole (PRILOSEC) 20 MG capsule Take 1 capsule (20 mg total) by mouth daily. 10/11/16   Olean Ree, MD  ondansetron (ZOFRAN) 8 MG tablet Take 1 tablet (8 mg)  by mouth every 12 hours 30 minutes prior to each dose of oral chemotherapy to prevent nausea. 10/30/16   Historical Provider, MD  oxyCODONE (OXY IR/ROXICODONE) 5 MG immediate release tablet Take 1-2 tablets (5-10 mg total) by mouth every 4 (four)  hours as needed for moderate pain or severe pain (mild pain). 10/27/16   Gladstone Lighter, MD  Polyethylene Glycol POWD Take 17 g by mouth daily. 10/11/16   Olean Ree, MD  prochlorperazine (COMPAZINE) 10 MG tablet Take by mouth. 10/30/16   Historical Provider, MD    Inpatient Medications:  .  ceFAZolin (ANCEF) IV  2 g Intravenous Once  . insulin aspart  0-15 Units Subcutaneous Q4H   . sodium chloride 75 mL/hr (12/01/16 0354)    Allergies: No Known Allergies  Social History   Social History  . Marital status: Married    Spouse name: N/A  . Number of children: N/A  . Years of education: N/A   Occupational History  . Not on file.   Social History Main Topics  . Smoking status: Former Smoker    Years: 10.00    Types: Cigarettes    Quit date: 06/15/1995  . Smokeless tobacco: Never Used  . Alcohol use No  . Drug use: No  . Sexual activity: Not on file   Other Topics Concern  . Not on file   Social History Narrative  . No narrative on file     Family History  Problem Relation Age of Onset  . Cancer Brother     not sure what kind     Review of Systems Positive forHip pain Negative for: General:  chills, fever, night sweats or weight changes.  Cardiovascular: PND orthopnea syncope dizziness  Dermatological skin lesions rashes Respiratory: Cough congestion Urologic: Frequent urination urination at night and hematuria Abdominal: negative for nausea, vomiting, diarrhea, bright red blood per rectum, melena, or hematemesis Neurologic: negative for visual changes, and/or hearing changes  All other systems reviewed and are otherwise negative except as noted above.  Labs:  Recent Labs  12/01/16 0029  CKTOTAL 104  TROPONINI 0.03*   Lab Results  Component Value Date   WBC 9.1 12/01/2016   HGB 8.9 (L) 12/01/2016   HCT 25.5 (L) 12/01/2016   MCV 102.6 (H) 12/01/2016   PLT 136 (L) 12/01/2016    Recent Labs Lab 12/01/16 0029  NA 128*  K 3.4*  CL 96*  CO2 24   BUN 14  CREATININE 0.45*  CALCIUM 8.0*  PROT 6.6  BILITOT 8.6*  ALKPHOS 607*  ALT 62  AST 97*  GLUCOSE 219*   Lab Results  Component Value Date   CHOL 282 (H) 12/01/2016   HDL 10 (L) 12/01/2016   LDLCALC 229 (H) 12/01/2016   TRIG 214 (H) 12/01/2016   No results found for: DDIMER  Radiology/Studies:  Dg Chest 1 View  Result Date: 12/01/2016 CLINICAL DATA:  Unwitnessed fall.  Left hip fracture EXAM: CHEST 1 VIEW COMPARISON:  10/12/2016 FINDINGS: There is a left subclavian port extending into the right atrium. Mild linear scarring or atelectasis in the left base. No consolidation. No effusion. No pneumothorax. IMPRESSION: No acute findings. Electronically Signed   By: Andreas Newport M.D.   On: 12/01/2016 00:01   Dg Hip Unilat W Or W/o Pelvis 2-3 Views Left  Result Date: 12/01/2016 CLINICAL DATA:  65 year old male with fall and left hip pain. History of metastatic colorectal carcinoma. EXAM: DG HIP (WITH OR WITHOUT PELVIS) 2-3V LEFT COMPARISON:  CT  of the abdomen pelvis dated 10/23/2016 FINDINGS: There is a displaced oblique subtrochanteric fracture of the left femur with mild varus angulation. There is proximal migration of the femoral shaft in relation to the head of the femur. The femoral head remains in anatomic alignment with the acetabulum. No other acute fracture identified. There is no dislocation. The bones are osteopenic. There is soft tissue swelling of the left hip. Large amount of stool noted throughout the colon. IMPRESSION: Displaced sub trochanteric fracture of the left femur. No dislocation. Electronically Signed   By: Anner Crete M.D.   On: 12/01/2016 00:05   Dg Femur Min 2 Views Left  Result Date: 12/01/2016 CLINICAL DATA:  Fall and hip pain EXAM: LEFT FEMUR 2 VIEWS COMPARISON:  None. FINDINGS: There is an acute oblique fracture of the proximal left femur, just below the intertrochanteric line. The femoral head remains situated within the acetabular cup. The  remainder of the femur is normal without focal osseous lesion. IMPRESSION: Oblique fracture of the proximal left femur, just below the intertrochanteric line with mild lateral displacement. No other focal osseous abnormality of the left femur. Electronically Signed   By: Ulyses Jarred M.D.   On: 12/01/2016 01:39    EKG: Normal sinus rhythm  Weights: Filed Weights   12/01/16 0005  Weight: 61.2 kg (135 lb)     Physical Exam: Blood pressure (!) 121/53, pulse (!) 103, temperature 98.9 F (37.2 C), temperature source Oral, resp. rate 18, height 5\' 3"  (1.6 m), weight 61.2 kg (135 lb), SpO2 94 %. Body mass index is 23.91 kg/m. General: Well developed, well nourished, in no acute distress. Head eyes ears nose throat: Normocephalic, atraumatic, sclera non-icteric, no xanthomas, nares are without discharge. No apparent thyromegaly and/or mass  Lungs: Normal respiratory effort.  no wheezes, no rales, no rhonchi.  Heart: RRR with normal S1 S2. no murmur gallop, no rub, PMI is normal size and placement, carotid upstroke normal without bruit, jugular venous pressure is normal Abdomen: Soft, non-tender, non-distended with normoactive bowel sounds. No hepatomegaly. No rebound/guarding. No obvious abdominal masses. Abdominal aorta is normal size without bruit Extremities: No edema. no cyanosis, no clubbing, no ulcers  Peripheral : 2+ bilateral upper extremity pulses, 2+ bilateral femoral pulses, 2+ bilateral dorsal pedal pulse Neuro: Alert and oriented. No facial asymmetry. No focal deficit. Moves all extremities spontaneously. Musculoskeletal: Normal muscle tone without kyphosis Psych:  Responds to questions appropriately with a normal affect.    Assessment: 65 year old male with diabetes without complication essential hypertension makes hyperlipidemia with hip fracture and no current evidence of signs or symptoms of heart failure or angina and no evidence of myocardial infarction at lowest risk  possible for cardiovascular complication with surgery  Plan: 1. No further cardiac diagnostics necessary at this time 2. Proceed to surgery without restriction and/or restriction to rehabilitation 3. Continue following closely perioperatively for hypertension control and diabetes control and medication management as necessary.  Signed, Corey Skains M.D. [facility] 12/01/2016, 9:28 AM

## 2016-12-01 NOTE — Clinical Social Work Note (Signed)
CSW has received a consult for possible SNF placement. CSW is following pending PT recommendations.   Santiago Bumpers, MSW, LCSW-A [phone]

## 2016-12-01 NOTE — Anesthesia Preprocedure Evaluation (Signed)
Anesthesia Evaluation  Patient identified by MRN, date of birth, ID band Patient awake    Reviewed: Allergy & Precautions, H&P , NPO status , Patient's Chart, lab work & pertinent test results, reviewed documented beta blocker date and time   Airway Mallampati: II  TM Distance: >3 FB Neck ROM: full    Dental  (+) Teeth Intact   Pulmonary neg pulmonary ROS, former smoker,    Pulmonary exam normal        Cardiovascular hypertension, negative cardio ROS Normal cardiovascular exam Rhythm:regular Rate:Normal     Neuro/Psych negative neurological ROS  negative psych ROS   GI/Hepatic negative GI ROS, Neg liver ROS,   Endo/Other  negative endocrine ROSdiabetes  Renal/GU negative Renal ROS  negative genitourinary   Musculoskeletal   Abdominal   Peds  Hematology negative hematology ROS (+)   Anesthesia Other Findings Past Medical History: 08/2015: Cataract     Comment: forming 10/08/2016: Colorectal cancer ([place]) No date: Diabetes mellitus without complication ([place]) No date: Hyperlipidemia No date: Hypertension No date: Platelets decreased ([place]) Past Surgical History: 2003: CARDIAC CATHETERIZATION 10/04/2016: COLONOSCOPY N/A     Comment: Procedure: COLONOSCOPY;  Surgeon: Lollie Sails, MD;  Location: [facility] ENDOSCOPY;                Service: Endoscopy;  Laterality: N/A; 10/25/2016: ERCP N/A     Comment: Procedure: ENDOSCOPIC RETROGRADE               CHOLANGIOPANCREATOGRAPHY (ERCP);  Surgeon:               Lucilla Lame, MD;  Location: [facility] ENDOSCOPY;                Service: Endoscopy;  Laterality: N/A; 10/04/2016: ESOPHAGOGASTRODUODENOSCOPY (EGD) WITH PROPOFOL N/A     Comment: Procedure: ESOPHAGOGASTRODUODENOSCOPY (EGD)               WITH PROPOFOL;  Surgeon: Lollie Sails, MD;              Location: [facility] ENDOSCOPY;  Service: Endoscopy;               Laterality: N/A; 10/12/2016: PORTACATH PLACEMENT  Left     Comment: Procedure: INSERTION PORT-A-CATH;  Surgeon:               Olean Ree, MD;  Location: [facility] ORS;                Service: General;  Laterality: Left; BMI    Body Mass Index:  23.91 kg/m     Reproductive/Obstetrics negative OB ROS                             Anesthesia Physical Anesthesia Plan  ASA: III and emergent  Anesthesia Plan: General ETT   Post-op Pain Management:    Induction:   Airway Management Planned:   Additional Equipment:   Intra-op Plan:   Post-operative Plan:   Informed Consent: I have reviewed the patients History and Physical, chart, labs and discussed the procedure including the risks, benefits and alternatives for the proposed anesthesia with the patient or authorized representative who has indicated his/her understanding and acceptance.   Dental Advisory Given  Plan Discussed with: CRNA  Anesthesia Plan Comments:         Anesthesia Quick Evaluation

## 2016-12-01 NOTE — Transfer of Care (Signed)
Immediate Anesthesia Transfer of Care Note  Patient: Nathan Atkinson  Procedure(s) Performed: Procedure(s): INTRAMEDULLARY (IM) NAIL INTERTROCHANTRIC (Left)  Patient Location: PACU  Anesthesia Type:General  Level of Consciousness: sedated  Airway & Oxygen Therapy: Patient Spontanous Breathing and Patient connected to face mask oxygen  Post-op Assessment: Report given to RN and Post -op Vital signs reviewed and stable  Post vital signs: Reviewed and stable  Last Vitals:  Vitals:   12/01/16 1055 12/01/16 1427  BP: (!) 109/56 100/60  Pulse: 91 70  Resp:  16  Temp: 37 C 36.6 C    Last Pain:  Vitals:   12/01/16 1427  TempSrc: Tympanic  PainSc:          Complications: No apparent anesthesia complications

## 2016-12-01 NOTE — Consult Note (Addendum)
Displaced subtrochanteric hip fracture, plan ORIF in am if medically ok. Will discuss with patient and family around 7 am, surgery tentatively scheduled for 8 am.  Patient seen with interpreter. The patient has a shortened and externally rotated left lower extremity with palpable pulse. He is able to flex extend the toes. Skin is intact. I has significant deformity. Reviewed x-rays with family showing completely displaced subtrochanteric fracture. Risks benefits possible complications were addressed.  Recommendations for ORIF as soon as possible to diminished pain and bleeding.

## 2016-12-01 NOTE — Progress Notes (Signed)
Cardiologist at bedside. Spanish Interpreter paged. Placed Video monitor for interpreter at bedside.

## 2016-12-01 NOTE — Progress Notes (Signed)
Pt requires Spanish interpreter. Dr. Rudene Christians at bedside. Pending cardiac clearance. Report called to [facility] in [place].

## 2016-12-01 NOTE — Progress Notes (Signed)
New Admission Note:   Arrival Method: per stretcher from ED, pt came from home Mental Orientation: alert and oriented X4, pt speaks Spanish, son interprets Telemetry: placed on telebox [id number], CCMD notified, verified with Miquel Dunn, NT Assessment: Completed Skin: warm, dry, intact, no preexisting wounds noted, within defined limits IV: G20 on the right forearm, G22 on the right wrist, both with transparent dressing, intact. Pt has an implanted port on the left upper chest for chemotherapy Pain: 10/10 scale on the left hip with movement, will administer PRN pain medicine Tubes: Fr16 foley catheter in place intact, draining to amber-colored urine, secured with statlock Safety Measures: Safety Fall Prevention Plan has been given and discussed with pt and family Admission: Completed 1A Orientation: Patient has been oriented to the room, unit and staff.  Family: son at bedside  Orders have been reviewed and implemented. Will continue to monitor the patient. Call light has been placed within reach and bed alarm has been activated.   Georgeanna Harrison BSN, RN [facility] 1A

## 2016-12-01 NOTE — Anesthesia Procedure Notes (Signed)
Procedure Name: Intubation Date/Time: 12/01/2016 1:03 PM Performed by: Johnna Acosta Pre-anesthesia Checklist: Patient identified, Emergency Drugs available, Suction available, Patient being monitored and Timeout performed Patient Re-evaluated:Patient Re-evaluated prior to inductionOxygen Delivery Method: Circle system utilized Preoxygenation: Pre-oxygenation with 100% oxygen Intubation Type: IV induction Ventilation: Mask ventilation without difficulty Laryngoscope Size: Miller and 2 Grade View: Grade I Tube type: Oral Tube size: 7.5 mm Airway Equipment and Method: Stylet Placement Confirmation: ETT inserted through vocal cords under direct vision,  positive ETCO2 and breath sounds checked- equal and bilateral Secured at: 20 cm Tube secured with: Tape Dental Injury: Teeth and Oropharynx as per pre-operative assessment  Comments: By Dr Andree Elk

## 2016-12-02 LAB — CBC
HEMATOCRIT: 22.3 % — AB (ref 40.0–52.0)
HEMOGLOBIN: 7.8 g/dL — AB (ref 13.0–18.0)
MCH: 35.9 pg — AB (ref 26.0–34.0)
MCHC: 34.8 g/dL (ref 32.0–36.0)
MCV: 103.3 fL — ABNORMAL HIGH (ref 80.0–100.0)
Platelets: 142 10*3/uL — ABNORMAL LOW (ref 150–440)
RBC: 2.16 MIL/uL — ABNORMAL LOW (ref 4.40–5.90)
RDW: 22.2 % — ABNORMAL HIGH (ref 11.5–14.5)
WBC: 9.7 10*3/uL (ref 3.8–10.6)

## 2016-12-02 LAB — GLUCOSE, CAPILLARY
GLUCOSE-CAPILLARY: 172 mg/dL — AB (ref 65–99)
GLUCOSE-CAPILLARY: 176 mg/dL — AB (ref 65–99)
GLUCOSE-CAPILLARY: 184 mg/dL — AB (ref 65–99)
Glucose-Capillary: 150 mg/dL — ABNORMAL HIGH (ref 65–99)
Glucose-Capillary: 140 mg/dL — ABNORMAL HIGH (ref 65–99)
Glucose-Capillary: 148 mg/dL — ABNORMAL HIGH (ref 65–99)

## 2016-12-02 LAB — BASIC METABOLIC PANEL
Anion gap: 6 (ref 5–15)
BUN: 11 mg/dL (ref 6–20)
CHLORIDE: 102 mmol/L (ref 101–111)
CO2: 24 mmol/L (ref 22–32)
CREATININE: 0.54 mg/dL — AB (ref 0.61–1.24)
Calcium: 7.6 mg/dL — ABNORMAL LOW (ref 8.9–10.3)
GFR calc Af Amer: >60 mL/min (ref >60)
GFR calc non Af Amer: >60 mL/min (ref >60)
Glucose, Bld: 167 mg/dL — ABNORMAL HIGH (ref 65–99)
POTASSIUM: 3.9 mmol/L (ref 3.5–5.1)
SODIUM: 132 mmol/L — AB (ref 135–145)

## 2016-12-02 LAB — HEMOGLOBIN AND HEMATOCRIT, BLOOD
HCT: 24.7 % — ABNORMAL LOW (ref 40.0–52.0)
HEMOGLOBIN: 8.9 g/dL — AB (ref 13.0–18.0)

## 2016-12-02 LAB — PREPARE RBC (CROSSMATCH)

## 2016-12-02 LAB — ABO/RH: ABO/RH(D): O POS

## 2016-12-02 MED ORDER — GLUCERNA SHAKE PO LIQD
237.0000 mL | Freq: Three times a day (TID) | ORAL | Status: DC
Start: 2016-12-02 — End: 2016-12-04
  Administered 2016-12-02 – 2016-12-04 (×5): 237 mL via ORAL

## 2016-12-02 MED ORDER — SODIUM CHLORIDE 0.9 % IV SOLN: Freq: Once | INTRAVENOUS | Status: DC

## 2016-12-02 NOTE — Clinical Social Work Note (Signed)
Clinical Social Work Assessment  Patient Details  Name: Nathan Atkinson MRN: [id number] Date of Birth: 08/14/1951  Date of referral:  12/02/16               Reason for consult:  Facility Placement                Permission sought to share information with:  Chartered certified accountant granted to share information::  Yes, Verbal Permission Granted  Name::      Home::   [place]   Relationship::     Contact Information:     Housing/Transportation Living arrangements for the past 2 months:  [place] of Information:  Adult Children Patient Interpreter Needed:  None Criminal Activity/Legal Involvement Pertinent to Current Situation/Hospitalization:  No - Comment as needed Significant Relationships:  Adult Children, Spouse Lives with:  Spouse, Adult Children Do you feel safe going back to the place where you live?  Yes Need for family participation in patient care:  Yes (Comment)  Care giving concerns:  Patient lives in [place] with his wife and his son Nathan Atkinson is staying with him temporarily.    Social Worker assessment / plan:  Holiday representative (CSW) received SNF consult. Per PT patient is receiving a blood transfusion and will not be evaluated until this afternoon. CSW met with patient and his son Nathan Atkinson [phone] was at bedside. Patient was asleep during assessment. Per son patient lives with his wife and son Nathan Atkinson in [place]. Per [name] he lives across the street from patient. Per son patient was working at [employer] and has not been working since he was diagnosed with colon cancer. Per son patient goes to [facility] about once per month for treatment and sons provide transport. CSW explained SNF option and that UMR will have to approve SNF. CSW explained that UMR will likely require a co-pay starting day 1 at SNF around 60%. Son verbalized his understanding. CSW also explained to son how  to apply for Medicaid for patient. Son is agreeable to SNF search in [place]. FL2 complete and faxed out. CSW will continue to follow and assist as needed.   Employment status:  Disabled (Comment on whether or not currently receiving Disability) Insurance information:  Managed Care PT Recommendations:  Not assessed at this time Information / Referral to community resources:  [place]  Patient/Family's Response to care:  Patient's son is agreeable to SNF search in [place].   Patient/Family's Understanding of and Emotional Response to Diagnosis, Current Treatment, and Prognosis:  Patient's son was very pleasant and thanked CSW for assistance.   Emotional Assessment Appearance:  Appears stated age Attitude/Demeanor/Rapport:  Unable to Assess Affect (typically observed):  Unable to Assess Orientation:  Oriented to Self, Oriented to Place, Oriented to  Time, Oriented to Situation Alcohol / Substance use:  Not Applicable Psych involvement (Current and /or in the community):  No (Comment)  Discharge Needs  Concerns to be addressed:  Discharge Planning Concerns Readmission within the last 30 days:  No Current discharge risk:  Dependent with Mobility Barriers to Discharge:  Continued Medical Work up   [employer], Veronia Beets, LCSW 12/02/2016, 1:41 PM

## 2016-12-02 NOTE — Evaluation (Signed)
Physical Therapy Evaluation Patient Details Name: Quandell Abellera MRN: [id number] DOB: 1951-09-18 Today's Date: 12/02/2016   History of Present Illness  Patient is a 65 yo male who was home and went to walk into the kitchen to get something and fell, his family found him on the floor and called EMS, he was admitted to [facility] and underwent surgery for IM nailing of left hip.    Clinical Impression  Pt showed very good effort w/o PT exam and did will with ~10 minutes of exercises apart from the exam.  He was able to do some limited in-room ambulation and though he was initially hesitant with walking he ultimately showed good ability to maintain PWBing with walker and push though some weakness and pain to do more than he initially thought he could even consider doing.     Follow Up Recommendations SNF    Equipment Recommendations  Rolling walker with 5" wheels    Recommendations for Other Services       Precautions / Restrictions Precautions Precautions: Fall Restrictions Weight Bearing Restrictions: Yes LLE Weight Bearing: Partial weight bearing      Mobility  Bed Mobility Overal bed mobility: Needs Assistance Bed Mobility: Supine to Sit     Supine to sit: Min assist     General bed mobility comments: Pt shows good effort getting to sitting EOB, did ultimately need assist to get to and maintain sitting - clearly had some difficulty with seated WBing on the L (leaning away heavily)  Transfers Overall transfer level: Needs assistance Equipment used: Rolling walker (2 wheeled) Transfers: Sit to/from Stand Sit to Stand: Min assist         General transfer comment: Pt was able to rise to standing with only light assist - showed good effort with ability to use UEs for WBing in walker  Ambulation/Gait Ambulation/Gait assistance: Min assist Ambulation Distance (Feet): 18 Feet Assistive device: Rolling walker (2 wheeled)       General Gait Details: Pt initially  very hesistant with WBing on L despite heavy use of UEs and did need direct unweighting from PT to be able to advance R LE, he did become more comfortable (while still maintaining PWBing) and was able to improve ambulation/cadence and generally did well.   Stairs            Wheelchair Mobility    Modified Rankin (Stroke Patients Only)       Balance Overall balance assessment: Needs assistance   Sitting balance-Leahy Scale: Fair       Standing balance-Leahy Scale: Fair                               Pertinent Vitals/Pain Pain Assessment: 0-10 Pain Score: 8  Pain Location: minimal left hip pain at rest, severe with movement Pain Descriptors / Indicators: Aching Pain Intervention(s): Limited activity within patient's tolerance;Repositioned;Monitored during session    Home Living Family/patient expects to be discharged to:: Private residence Living Arrangements: Alone Available Help at Discharge: Family (family members are available and regularly help) Type of Home: Mobile home Home Access: Stairs to enter Entrance Stairs-Rails: Right Entrance Stairs-Number of Steps: 6 Home Layout: One level Home Equipment: None      Prior Function Level of Independence: Needs assistance   Gait / Transfers Assistance Needed: Patient ambulated independently prior to fall without an assistive device, patient reports increased weakness in the last month from cancer tx.   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  Within the last month patient has required increased assistance at home with daily tasks.         Hand Dominance   Dominant Hand: Right    Extremity/Trunk Assessment   Upper Extremity Assessment Upper Extremity Assessment: Defer to OT evaluation LUE Deficits / Details: LUE with ROM WFLs however has pain and cannot withstand any resistance for strength testing.   Has orders to follow up with evaluation of the shoulder.     Lower Extremity Assessment Lower Extremity Assessment: LLE deficits/detail LLE Deficits / Details: Pt has some minimal AROM with hip Abd and knee/ankle, hip flexion very limited and painful - grossly 3/5 t/o       Communication   Communication: Prefers language other than English (deferred interpreter - speaks some [place], son assisted)  Cognition Arousal/Alertness: Awake/alert Behavior During Therapy: WFL for tasks assessed/performed Overall Cognitive Status: Within Functional Limits for tasks assessed                      General Comments      Exercises General Exercises - Lower Extremity Ankle Circles/Pumps: AROM;10 reps;Left Quad Sets: Strengthening;10 reps;Left Gluteal Sets: Strengthening;10 reps;Left Short Arc Quad: Strengthening;10 reps;Left Heel Slides: Strengthening;10 reps;Left Hip ABduction/ADduction: Strengthening;10 reps;Left Straight Leg Raises: AAROM;5 reps;Left   Assessment/Plan    PT Assessment Patient needs continued PT services  PT Problem List Decreased strength;Decreased range of motion;Decreased activity tolerance;Decreased balance;Decreased mobility;Decreased coordination;Decreased cognition;Decreased knowledge of use of DME;Decreased safety awareness;Pain          PT Treatment Interventions Gait training;Therapeutic exercise;Therapeutic activities;Functional mobility training;Balance training;Stair training    PT Goals (Current goals can be found in the Care Plan section)  Acute Rehab PT Goals Patient Stated Goal: get stronger and go home PT Goal Formulation: With patient/family Time For Goal Achievement: 12/16/16 Potential to Achieve Goals: Fair    Frequency BID   Barriers to discharge        Co-evaluation               End of Session Equipment Utilized During Treatment: Gait belt Activity Tolerance: Patient limited by pain;Patient limited by fatigue Patient  left: with chair alarm set;with call bell/phone within reach           Time: 1315-1346 PT Time Calculation (min) (ACUTE ONLY): 31 min   Charges:   PT Evaluation $PT Eval Low Complexity: 1 Procedure PT Treatments $Therapeutic Exercise: 8-22 mins   PT G Codes:        Kreg Shropshire, DPT 12/02/2016, 4:36 PM

## 2016-12-02 NOTE — Evaluation (Signed)
Occupational Therapy Evaluation Patient Details Name: Nathan Atkinson MRN: [id number] DOB: 1951-12-08 Today's Date: 12/02/2016    History of Present Illness Patient is a 65 yo male who was home and went to walk into the kitchen to get something and fell, his family found him on the floor and called EMS, he was admitted to [facility] and underwent surgery for IM nailing of left hip.     Clinical Impression   Patient was seen for OT evaluation this date, patient presents with muscle weakness, increased pain, decreased ability to perform self care tasks, transfers and functional mobility. Patient now requires increased assistance with bathing, dressing and grooming tasks. Patient would benefit from short term rehab to improve self care and functional mobility skills since he lives alone and only has family to come in and out to assist.  Patient would benefit from skilled OT during his hospital stay to maximize his safety and independence in daily tasks.    Follow Up Recommendations  SNF    Equipment Recommendations       Recommendations for Other Services       Precautions / Restrictions Precautions Precautions: Fall Restrictions Weight Bearing Restrictions: Yes LLE Weight Bearing: Partial weight bearing      Mobility Bed Mobility                  Transfers Overall transfer level: Needs assistance Equipment used: Rolling walker (2 wheeled) Transfers: Sit to/from Stand Sit to Stand: Min assist         General transfer comment: Patient up to chair on arrival, transfer sit to stand from recliner chair.     Balance                                            ADL Overall ADL's : Needs assistance/impaired Eating/Feeding: Independent   Grooming: Sitting;Set up   Upper Body Bathing: Set up   Lower Body Bathing: Moderate assistance   Upper Body Dressing : Set up   Lower Body Dressing: Maximal assistance   Toilet Transfer: Minimal  assistance   Toileting- Clothing Manipulation and Hygiene: Minimal assistance               Vision     Perception     Praxis      Pertinent Vitals/Pain Pain Assessment: 0-10 Pain Score: 8  Pain Location: left hip, asked patient if he would like pain medication however he states he would like to wait until he gets up to go to bed.  Pain Descriptors / Indicators: Aching Pain Intervention(s): Limited activity within patient's tolerance;Repositioned;Monitored during session     Hand Dominance Right   Extremity/Trunk Assessment Upper Extremity Assessment Upper Extremity Assessment: LUE deficits/detail LUE Deficits / Details: LUE with ROM WFLs however has pain and cannot withstand any resistance for strength testing.  Has orders to follow up with evaluation of the shoulder.    Lower Extremity Assessment Lower Extremity Assessment: Defer to PT evaluation       Communication Communication Communication: No difficulties;Other (comment) (Patient's primary language is Spanish however he is able to speak [place], son also present and provided information regarding home set up. )   Cognition Arousal/Alertness: Awake/alert Behavior During Therapy: WFL for tasks assessed/performed Overall Cognitive Status: Within Functional Limits for tasks assessed  General Comments       Exercises       Shoulder Instructions      Home Living Family/patient expects to be discharged to:: Private residence Living Arrangements: Alone (Son reports patient lives alone, has a another son who was staying temporarily for another week. ) Available Help at Discharge: Family Type of Home: Mobile home Home Access: Stairs to enter Entrance Stairs-Number of Steps: 3 Entrance Stairs-Rails: Right Home Layout: One level     Bathroom Shower/Tub: Tub/shower unit;Curtain Shower/tub characteristics: Architectural technologist: Standard     Home Equipment: Grab bars -  tub/shower          Prior Functioning/Environment Level of Independence: Needs assistance  Gait / Transfers Assistance Needed: Patient ambulated independently prior to fall without an assistive device, patient reports increased weakness in the last month from cancer tx.  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  Within the last month patient has required increased assistance at home with daily tasks.             OT Problem List: Decreased strength;Pain;Decreased range of motion;Decreased activity tolerance;Decreased knowledge of use of DME or AE;Impaired UE functional use   OT Treatment/Interventions: Self-care/ADL training;DME and/or AE instruction;Therapeutic activities;Therapeutic exercise;Patient/family education    OT Goals(Current goals can be found in the care plan section) Acute Rehab OT Goals Patient Stated Goal: to be able to return home and take care of himself. OT Goal Formulation: With patient/family Time For Goal Achievement: 12/15/16 Potential to Achieve Goals: Good  OT Frequency: Min 1X/week   Barriers to D/C:            Co-evaluation              End of Session Equipment Utilized During Treatment: Gait belt;Rolling walker  Activity Tolerance: Patient tolerated treatment well;Patient limited by pain Patient left: in chair;with call bell/phone within reach;with chair alarm set;with family/visitor present   Time: [id number] OT Time Calculation (min): 24 min Charges:  OT General Charges $OT Visit: 1 Procedure OT Evaluation $OT Eval Low Complexity: 1 Procedure G-Codes:    Carleena Mires  Chae Oommen T Analuisa Tudor, OTR/L, CLT  12/02/2016, 2:34 PM

## 2016-12-02 NOTE — Clinical Social Work Placement (Signed)
   CLINICAL SOCIAL WORK PLACEMENT  NOTE  Date:  12/02/2016  Patient Details  Name: Nathan Atkinson MRN: [id number] Date of Birth: 07/30/1951  Clinical Social Work is seeking post-discharge placement for this patient at the [place] level of care (*CSW will initial, date and re-position this form in  chart as items are completed):  Yes   Patient/family provided with [facility] Work Department's list of facilities offering this level of care within the geographic area requested by the patient (or if unable, by the patient's family).  Yes   Patient/family informed of their freedom to choose among providers that offer the needed level of care, that participate in Medicare, Medicaid or managed care program needed by the patient, have an available bed and are willing to accept the patient.  Yes   Patient/family informed of [facility]'s ownership interest in [facility] and [facility], as well as of the fact that they are under no obligation to receive care at these facilities.  PASRR submitted to EDS on 12/02/16     PASRR number received on 12/02/16     Existing PASRR number confirmed on       FL2 transmitted to all facilities in geographic area requested by pt/family on 12/02/16     FL2 transmitted to all facilities within larger geographic area on       Patient informed that his/her managed care company has contracts with or will negotiate with certain facilities, including the following:            Patient/family informed of bed offers received.  Patient chooses bed at       Physician recommends and patient chooses bed at      Patient to be transferred to   on  .  Patient to be transferred to facility by       Patient family notified on   of transfer.  Name of family member notified:        PHYSICIAN       Additional Comment:    _______________________________________________ Cina Klumpp, Veronia Beets, LCSW 12/02/2016, 1:40  PM

## 2016-12-02 NOTE — Progress Notes (Signed)
[facility] [place] NAME: Nathan Atkinson    MR#:  [id number]  DATE OF BIRTH:  [birth year]  SUBJECTIVE:   via video Spanish interpreter. Wife in the room. Patient admitted after he had a mechanical fall at home. Found to have hip fracture. Pain at surgical site REVIEW OF SYSTEMS:   Review of Systems  Constitutional: Negative for chills, fever and weight loss.  HENT: Negative for ear discharge, ear pain and nosebleeds.   Eyes: Negative for blurred vision, pain and discharge.  Respiratory: Negative for sputum production, shortness of breath, wheezing and stridor.   Cardiovascular: Negative for chest pain, palpitations, orthopnea and PND.  Gastrointestinal: Negative for abdominal pain, diarrhea, nausea and vomiting.  Genitourinary: Negative for frequency and urgency.  Musculoskeletal: Positive for joint pain. Negative for back pain.  Neurological: Negative for sensory change, speech change, focal weakness and weakness.  Psychiatric/Behavioral: Negative for depression and hallucinations. The patient is not nervous/anxious.    Tolerating Diet:Yes Tolerating PT: Pending  DRUG ALLERGIES:  No Known Allergies  VITALS:  Blood pressure (!) 102/54, pulse 81, temperature 98.1 F (36.7 C), temperature source Oral, resp. rate 16, height 5\' 3"  (1.6 m), weight 61.2 kg (135 lb), SpO2 97 %.  PHYSICAL EXAMINATION:   Physical Exam  GENERAL:  65 y.o.-year-old patient lying in the bed with no acute distress.  EYES: Pupils equal, round, reactive to light and accommodation. No scleral icterus. Extraocular muscles intact.  HEENT: Head atraumatic, normocephalic. Oropharynx and nasopharynx clear.  NECK:  Supple, no jugular venous distention. No thyroid enlargement, no tenderness.  LUNGS: Normal breath sounds bilaterally, no wheezing, rales, rhonchi. No use of accessory muscles of respiration.  CARDIOVASCULAR: S1, S2 normal. No murmurs, rubs, or  gallops.  ABDOMEN: Soft, nontender, nondistended. Bowel sounds present. No organomegaly or mass.  EXTREMITIES: No cyanosis, clubbing or edema b/l.    NEUROLOGIC: Cranial nerves II through XII are intact. No focal Motor or sensory deficits b/l.   PSYCHIATRIC:  patient is alert and oriented x 3.  SKIN: No obvious rash, lesion, or ulcer.   LABORATORY PANEL:  CBC  Recent Labs Lab 12/02/16 0446  WBC 9.7  HGB 7.8*  HCT 22.3*  PLT 142*    Chemistries   Recent Labs Lab 12/01/16 0029  12/02/16 0446  NA 128*  < > 132*  K 3.4*  < > 3.9  CL 96*  --  102  CO2 24  --  24  GLUCOSE 219*  --  167*  BUN 14  --  11  CREATININE 0.45*  --  0.54*  CALCIUM 8.0*  --  7.6*  AST 97*  --   --   ALT 62  --   --   ALKPHOS 607*  --   --   BILITOT 8.6*  --   --   < > = values in this interval not displayed. Cardiac Enzymes  Recent Labs Lab 12/01/16 0029  TROPONINI 0.03*   RADIOLOGY:  Dg Chest 1 View  Result Date: 12/01/2016 CLINICAL DATA:  Unwitnessed fall.  Left hip fracture EXAM: CHEST 1 VIEW COMPARISON:  10/12/2016 FINDINGS: There is a left subclavian port extending into the right atrium. Mild linear scarring or atelectasis in the left base. No consolidation. No effusion. No pneumothorax. IMPRESSION: No acute findings. Electronically Signed   By: Andreas Newport M.D.   On: 12/01/2016 00:01   Dg Hip Operative Unilat W Or W/o Pelvis Left  Result Date:  12/01/2016 CLINICAL DATA:  Left femoral fracture EXAM: OPERATIVE LEFT HIP WITH PELVIS COMPARISON:  None. FLUOROSCOPY TIME:  Radiation Exposure Index (as provided by the fluoroscopic device): 19.09 mGy If the device does not provide the exposure index: Fluoroscopy Time:  2 minutes 54 seconds Number of Acquired Images:  6 FINDINGS: Approximation of the fracture fragments is noted. A medullary rod with proximal fixation screw is seen on the termination images with near anatomic alignment of the fracture fragments. IMPRESSION: ORIF of proximal  left femoral fracture. Electronically Signed   By: Inez Catalina M.D.   On: 12/01/2016 16:10   Dg Hip Unilat W Or W/o Pelvis 2-3 Views Left  Result Date: 12/01/2016 CLINICAL DATA:  65 year old male with fall and left hip pain. History of metastatic colorectal carcinoma. EXAM: DG HIP (WITH OR WITHOUT PELVIS) 2-3V LEFT COMPARISON:  CT of the abdomen pelvis dated 10/23/2016 FINDINGS: There is a displaced oblique subtrochanteric fracture of the left femur with mild varus angulation. There is proximal migration of the femoral shaft in relation to the head of the femur. The femoral head remains in anatomic alignment with the acetabulum. No other acute fracture identified. There is no dislocation. The bones are osteopenic. There is soft tissue swelling of the left hip. Large amount of stool noted throughout the colon. IMPRESSION: Displaced sub trochanteric fracture of the left femur. No dislocation. Electronically Signed   By: Anner Crete M.D.   On: 12/01/2016 00:05   Dg Femur Min 2 Views Left  Result Date: 12/01/2016 CLINICAL DATA:  Fall and hip pain EXAM: LEFT FEMUR 2 VIEWS COMPARISON:  None. FINDINGS: There is an acute oblique fracture of the proximal left femur, just below the intertrochanteric line. The femoral head remains situated within the acetabular cup. The remainder of the femur is normal without focal osseous lesion. IMPRESSION: Oblique fracture of the proximal left femur, just below the intertrochanteric line with mild lateral displacement. No other focal osseous abnormality of the left femur. Electronically Signed   By: Ulyses Jarred M.D.   On: 12/01/2016 01:39   ASSESSMENT AND PLAN:   65 y.o. 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 Displaced subtrochanteric fracture of the left femur - Postop day 1 doing well   2. Hyponatremia and hypokalemia, chronic likely related to decreased by  mouth intake -IV normal saline with potassium - sodium was 128 improved to 132   3. Macrocytic anemia which has developed in the past month, likely related to chemotherapy -Consider transfusion preoperatively if hemoglobin drops.  4. Thrombocytopenia, chronic -Monitor CBC especially with respect to DVT prophylaxis  5. History of metastatic colorectal cancer -Continue by mouth chemotherapy  6. History of diabetes - regular insulin sliding scale coverage  7. History of hypertension and hyperlipidemia, not currently medicated -Monitor blood pressure  Social worker consult for discharge planning.  Case discussed with Care Management/Social Worker. Management plans discussed with the patient, family and they are in agreement.  CODE STATUS: Full  DVT Prophylaxis: After surgery  TOTAL TIME TAKING CARE OF THIS PATIENT: 25  minutes.  >50% time spent on counselling and coordination of care  POSSIBLE D/C [facility] to 2  DAYS, DEPENDING ON CLINICAL CONDITION.  Note: This dictation was prepared with Dragon dictation along with smaller phrase technology. Any transcriptional errors that result from this process are unintentional.  Kathlyne Loud M.D on 12/02/2016 at 12:09 PM  Between 7am to 6pm - Pager -  [phone]  After 6pm go to [email or web address] - password [email or web address] [facility]  Office  [phone]  CC: Primary care physician; Baltazar Apo, MD

## 2016-12-02 NOTE — Progress Notes (Signed)
Initial Nutrition Assessment  DOCUMENTATION CODES:   Severe malnutrition in context of chronic illness  INTERVENTION:   Glucerna Shake po TID, each supplement provides 220 kcal and 10 grams of protein  Snacks  NUTRITION DIAGNOSIS:   Malnutrition related to poor appetite and cancer as evidenced by severe depletion of muscle mass, severe depletion of body fat.  GOAL:   Patient will meet greater than or equal to 90% of their needs  MONITOR:   PO intake, Supplement acceptance  REASON FOR ASSESSMENT:   Consult Hip fracture protocol  ASSESSMENT:   Nathan Atkinson is a 65 y.o. 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    Met with pt in room today. Pt does not speak [place] but RD spoke to family members at bedside who spoke [place]. Family reports pt was eating well pta but has been slowly declining in his weight over the past year. Family reports that pt used to weigh 160lbs at the beginning of the year. Per chart, pt has lost 12lbs(8%) over the past two months. This is considered significant. Pt reports that he is eating <50% meals in hospital. Pt is willing to try supplement; family requesting Glucerna. Will also order snacks for this pt. Explained to family members the importance of pt getting adequate protein.   Medications reviewed and include: xeloda, colace, lovenox, insulin, protonix, miralax, oxycodone    Labs reviewed: Na 132(L), Ca 7.6 adj. 9.36 wnl, Alk Phos 607(H), Alb 1.8(L), AST 97(H), tbili 8.6(H) Hgb 7.8(L), Hct 22.3(L) CBGs- 219, 167 x 24 hrs  Nutrition-Focused physical exam completed. Findings are severe fat depletion, severe muscle depletion, and mild edema in LLE.    Diet Order:  Diet Carb Modified Fluid consistency: Thin; Room service appropriate? Yes  Skin:  Reviewed, no  issues  Last BM:  12/24  Height:   Ht Readings from Last 1 Encounters:  12/01/16 '5\' 3"'  (1.6 m)    Weight:   Wt Readings from Last 1 Encounters:  12/01/16 135 lb (61.2 kg)    Ideal Body Weight:  56.3 kg  BMI:  Body mass index is 23.91 kg/m.  Estimated Nutritional Needs:   Kcal:  1700-2000kcal/day   Protein:  68-80g/day   Fluid:  >1.7L/day   EDUCATION NEEDS:   No education needs identified at this time  Koleen Distance, RD, LDN Pager #[phone]

## 2016-12-02 NOTE — Progress Notes (Signed)
  Subjective: 1 Day Post-Op Procedure(s) (LRB): INTRAMEDULLARY (IM) NAIL INTERTROCHANTRIC (Left) Patient reports pain as moderate.   Patient seen in rounds with Dr. Rudene Christians. Patient is well, and has had no acute complaints or problems Plan is to go Rehab versus home after hospital stay. Negative for chest pain and shortness of breath Fever: no Gastrointestinal: Negative for nausea and vomiting  Objective: Vital signs in last 24 hours: Temp:  [97.8 F (36.6 C)-99 F (37.2 C)] 98 F (36.7 C) (12/24 0414) Pulse Rate:  [70-103] 82 (12/24 0414) Resp:  [12-19] 19 (12/24 0414) BP: (100-121)/(50-64) 102/50 (12/24 0414) SpO2:  [94 %-100 %] 98 % (12/24 0414)  Intake/Output from previous day:  Intake/Output Summary (Last 24 hours) at 12/02/16 0644 Last data filed at 12/01/16 2325  Gross per 24 hour  Intake            907.5 ml  Output             1875 ml  Net           -967.5 ml    Intake/Output this shift: Total I/O In: 100 [IV Piggyback:100] Out: 500 [Urine:500]  Labs:  Recent Labs  12/01/16 0029 12/01/16 1717 12/02/16 0446  HGB 8.9* 8.4* 7.8*    Recent Labs  12/01/16 0029 12/01/16 1717 12/02/16 0446  WBC 9.1  --  9.7  RBC 2.48*  --  2.16*  HCT 25.5* 24.4* 22.3*  PLT 136*  --  142*    Recent Labs  12/01/16 0029 12/01/16 1005 12/02/16 0446  NA 128* 132* 132*  K 3.4* 4.0 3.9  CL 96*  --  102  CO2 24  --  24  BUN 14  --  11  CREATININE 0.45*  --  0.54*  GLUCOSE 219*  --  167*  CALCIUM 8.0*  --  7.6*    Recent Labs  12/01/16 0029  INR 1.32     EXAM General - Patient is Alert and Oriented Extremity - Sensation intact distally Dorsiflexion/Plantar flexion intact No cellulitis present Compartment soft Dressing/Incision - clean, dry, no drainage Motor Function - intact, moving foot and toes well on exam.   Past Medical History:  Diagnosis Date  . Cataract 08/2015   forming  . Colorectal cancer ([place]) 10/08/2016  . Diabetes mellitus without  complication ([place])   . Hyperlipidemia   . Hypertension   . Platelets decreased (HCC)     Assessment/Plan: 1 Day Post-Op Procedure(s) (LRB): INTRAMEDULLARY (IM) NAIL INTERTROCHANTRIC (Left) Active Problems:   Hip fracture (HCC)  Estimated body mass index is 23.91 kg/m as calculated from the following:   Height as of this encounter: 5\' 3"  (1.6 m).   Weight as of this encounter: 61.2 kg (135 lb). Advance diet Up with therapy D/C IV fluids  Blood consent to be signed. Postop anemia. Transfuse 1 unit of blood this morning and recheck labs.  DVT Prophylaxis - Lovenox, Foot Pumps and TED hose Weight-Bearing as tolerated to left leg  Reche Dixon, PA-C Orthopaedic Surgery 12/02/2016, 6:44 AM

## 2016-12-02 NOTE — NC FL2 (Signed)
[place] LEVEL OF CARE SCREENING TOOL     IDENTIFICATION  Patient Name: Nathan Atkinson Birthdate: [birth year] Sex: male Admission Date (Current Location): 11/30/2016  [place] and [place] Number:  Engineering geologist and Address:  [facility], [address], [place], [place] [address]      Provider Number: [id number]  Attending Physician Name and Address:  Fritzi Mandes, MD  Relative Name and Phone Number:       Current Level of Care: Hospital Recommended Level of Care: [place] Prior Approval Number:    Date Approved/Denied:   PASRR Number:  ([id number])  Discharge Plan: SNF    Current Diagnoses: Patient Active Problem List   Diagnosis Date Noted  . Hip fracture ([place]) 12/01/2016  . Hypertension 11/20/2016  . Protein-calorie malnutrition, severe 10/25/2016  . Hyperbilirubinemia 10/24/2016  . 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 Obstructive jaundice 10/24/2016  . Rectal cancer metastasized to liver ([place])   . Port-a-cath in place   . Colorectal cancer ([place]) 10/08/2016  . Liver metastases ([place]) 10/08/2016  . Diabetes mellitus type 2, uncontrolled ([place]) 05/09/2016  . Diabetes mellitus type 2, controlled ([place]) 09/21/2015    Orientation RESPIRATION BLADDER Height & Weight     Self, Time, Situation, Place  Normal Continent Weight: 135 lb (61.2 kg) Height:  5\' 3"  (160 cm)  BEHAVIORAL SYMPTOMS/MOOD NEUROLOGICAL BOWEL NUTRITION STATUS   (none)  (none) Continent Diet (Diet: Carb Modified )  AMBULATORY STATUS COMMUNICATION OF NEEDS Skin   Extensive Assist Verbally Surgical wounds                       Personal Care Assistance Level of Assistance  Bathing, Feeding, Dressing Bathing Assistance: Limited assistance Feeding assistance: Independent Dressing Assistance: Limited assistance     Functional Limitations Info  Sight, Hearing, Speech Sight Info: Adequate Hearing Info: Adequate Speech Info:  Adequate    SPECIAL CARE FACTORS FREQUENCY  PT (By licensed PT), OT (By licensed OT)     PT Frequency:  (5) OT Frequency:  (5)            Contractures      Additional Factors Info  Code Status, Allergies, Insulin Sliding Scale Code Status Info:  (Full Code. ) Allergies Info:  (No Known Allergies. )   Insulin Sliding Scale Info:  (NovoLog Insulin )       Current Medications (12/02/2016):  This is the current hospital active medication list Current Facility-Administered Medications  Medication Dose Route Frequency Provider Last Rate Last Dose  . 0.9 %  sodium chloride infusion   Intravenous Continuous Hessie Knows, MD 75 mL/hr at 12/02/16 0426    . 0.9 %  sodium chloride infusion   Intravenous Once Reche Dixon, PA-C      . acetaminophen (TYLENOL) tablet 650 mg  650 mg Oral Q6H PRN Hessie Knows, MD       Or  . acetaminophen (TYLENOL) suppository 650 mg  650 mg Rectal Q6H PRN Hessie Knows, MD      . alum & mag hydroxide-simeth (MAALOX/MYLANTA) 200-200-20 MG/5ML suspension 30 mL  30 mL Oral Q4H PRN Hessie Knows, MD      . capecitabine (XELODA) tablet 1,000 mg  1,000 mg Oral BID PC Hessie Knows, MD   Stopped at 12/01/16 1800  . docusate sodium (COLACE) capsule 100 mg  100 mg Oral BID Hessie Knows, MD   100 mg at 12/01/16 2027  . enalapril (VASOTEC) tablet 10  mg  10 mg Oral QHS Hessie Knows, MD   10 mg at 12/01/16 2027  . enoxaparin (LOVENOX) injection 40 mg  40 mg Subcutaneous Q24H Hessie Knows, MD   40 mg at 12/02/16 0747  . feeding supplement (GLUCERNA SHAKE) (GLUCERNA SHAKE) liquid 237 mL  237 mL Oral TID BM Fritzi Mandes, MD      . insulin aspart (novoLOG) injection 0-15 Units  0-15 Units Subcutaneous Q4H Alexis Hugelmeyer, DO   3 Units at 12/02/16 1232  . magnesium citrate solution 1 Bottle  1 Bottle Oral Once PRN Alexis Hugelmeyer, DO      . magnesium hydroxide (MILK OF MAGNESIA) suspension 30 mL  30 mL Oral Daily PRN Hessie Knows, MD      . menthol-cetylpyridinium (CEPACOL)  lozenge 3 mg  1 lozenge Oral PRN Hessie Knows, MD       Or  . phenol (CHLORASEPTIC) mouth spray 1 spray  1 spray Mouth/Throat PRN Hessie Knows, MD      . methocarbamol (ROBAXIN) tablet 500 mg  500 mg Oral Q6H PRN Hessie Knows, MD       Or  . methocarbamol (ROBAXIN) 500 mg in dextrose 5 % 50 mL IVPB  500 mg Intravenous Q6H PRN Hessie Knows, MD      . metoCLOPramide (REGLAN) tablet 5-10 mg  5-10 mg Oral Q8H PRN Hessie Knows, MD       Or  . metoCLOPramide (REGLAN) injection 5-10 mg  5-10 mg Intravenous Q8H PRN Hessie Knows, MD      . morphine 2 MG/ML injection 0.5 mg  0.5 mg Intravenous Q2H PRN Alexis Hugelmeyer, DO      . morphine 2 MG/ML injection 2 mg  2 mg Intravenous Q1H PRN Hessie Knows, MD   2 mg at 12/01/16 1721  . morphine 4 MG/ML injection 2 mg  2 mg Intravenous Q1H PRN Hessie Knows, MD   2 mg at 12/01/16 1028  . ondansetron (ZOFRAN) tablet 4 mg  4 mg Oral Q6H PRN Hessie Knows, MD       Or  . ondansetron [facility]) injection 4 mg  4 mg Intravenous Q6H PRN Hessie Knows, MD      . oxyCODONE (Oxy IR/ROXICODONE) immediate release tablet 5-10 mg  5-10 mg Oral Q4H PRN Hessie Knows, MD   10 mg at 12/02/16 1232  . pantoprazole (PROTONIX) EC tablet 80 mg  80 mg Oral Daily Hessie Knows, MD   80 mg at 12/02/16 0936  . polyethylene glycol (MIRALAX / GLYCOLAX) packet 17 g  17 g Oral Daily Hessie Knows, MD      . prochlorperazine (COMPAZINE) tablet 10 mg  10 mg Oral Q6H PRN Hessie Knows, MD      . senna-docusate (Senokot-S) tablet 1 tablet  1 tablet Oral QHS PRN Alexis Hugelmeyer, DO      . zolpidem (AMBIEN) tablet 5 mg  5 mg Oral QHS PRN,MR X 1 Hessie Knows, MD       Facility-Administered Medications Ordered in Other Encounters  Medication Dose Route Frequency Provider Last Rate Last Dose  . heparin lock flush 100 unit/mL  500 Units Intravenous Once Lequita Asal, MD      . sodium chloride 0.9 % injection 10 mL  10 mL Intravenous Once Lequita Asal, MD         Discharge  Medications: Please see discharge summary for a list of discharge medications.  Relevant Imaging Results:  Relevant Lab Results:   Additional Information  (SSN: [id number])  Jaquell Seddon, Veronia Beets, LCSW

## 2016-12-03 LAB — CBC
HCT: 25.2 % — ABNORMAL LOW (ref 40.0–52.0)
Hemoglobin: 8.8 g/dL — ABNORMAL LOW (ref 13.0–18.0)
MCH: 35.2 pg — AB (ref 26.0–34.0)
MCHC: 34.8 g/dL (ref 32.0–36.0)
MCV: 101.2 fL — ABNORMAL HIGH (ref 80.0–100.0)
PLATELETS: 136 10*3/uL — AB (ref 150–440)
RBC: 2.49 MIL/uL — AB (ref 4.40–5.90)
RDW: 24.5 % — AB (ref 11.5–14.5)
WBC: 9.3 10*3/uL (ref 3.8–10.6)

## 2016-12-03 LAB — TYPE AND SCREEN
ABO/RH(D): O POS
ANTIBODY SCREEN: NEGATIVE
UNIT DIVISION: 0

## 2016-12-03 LAB — GLUCOSE, CAPILLARY
GLUCOSE-CAPILLARY: 115 mg/dL — AB (ref 65–99)
GLUCOSE-CAPILLARY: 153 mg/dL — AB (ref 65–99)
GLUCOSE-CAPILLARY: 198 mg/dL — AB (ref 65–99)
GLUCOSE-CAPILLARY: 97 mg/dL (ref 65–99)
Glucose-Capillary: 126 mg/dL — ABNORMAL HIGH (ref 65–99)
Glucose-Capillary: 156 mg/dL — ABNORMAL HIGH (ref 65–99)
Glucose-Capillary: 86 mg/dL (ref 65–99)

## 2016-12-03 MED ORDER — MORPHINE SULFATE (PF) 2 MG/ML IV SOLN
1.0000 mg | INTRAVENOUS | Status: DC | PRN
Start: 2016-12-03 — End: 2016-12-04

## 2016-12-03 MED ORDER — MORPHINE SULFATE (PF) 2 MG/ML IV SOLN: 2.0000 mg | INTRAVENOUS | Status: DC | PRN

## 2016-12-03 NOTE — Progress Notes (Signed)
[place] [facility] [place] NAME: Nathan Atkinson    MR#:  [id number]  DATE OF BIRTH:  08-31-1951  SUBJECTIVE:   via video Spanish interpreter.  Patient admitted after he had a mechanical fall at home. Found to have hip fracture. Pain at surgical site REVIEW OF SYSTEMS:   Review of Systems  Constitutional: Negative for chills, fever and weight loss.  HENT: Negative for ear discharge, ear pain and nosebleeds.   Eyes: Negative for blurred vision, pain and discharge.  Respiratory: Negative for sputum production, shortness of breath, wheezing and stridor.   Cardiovascular: Negative for chest pain, palpitations, orthopnea and PND.  Gastrointestinal: Negative for abdominal pain, diarrhea, nausea and vomiting.  Genitourinary: Negative for frequency and urgency.  Musculoskeletal: Positive for joint pain. Negative for back pain.  Neurological: Negative for sensory change, speech change, focal weakness and weakness.  Psychiatric/Behavioral: Negative for depression and hallucinations. The patient is not nervous/anxious.    Tolerating Diet:Yes Tolerating PT: snf  DRUG ALLERGIES:  No Known Allergies  VITALS:  Blood pressure (!) 102/55, pulse 84, temperature 98.2 F (36.8 C), temperature source Oral, resp. rate 18, height 5\' 3"  (1.6 m), weight 61.2 kg (135 lb), SpO2 99 %.  PHYSICAL EXAMINATION:   Physical Exam  GENERAL:  65 y.o.-year-old patient lying in the bed with no acute distress.  EYES: Pupils equal, round, reactive to light and accommodation. No scleral icterus. Extraocular muscles intact.  HEENT: Head atraumatic, normocephalic. Oropharynx and nasopharynx clear.  NECK:  Supple, no jugular venous distention. No thyroid enlargement, no tenderness.  LUNGS: Normal breath sounds bilaterally, no wheezing, rales, rhonchi. No use of accessory muscles of respiration.  CARDIOVASCULAR: S1, S2 normal. No murmurs, rubs, or gallops.  ABDOMEN:  Soft, nontender, nondistended. Bowel sounds present. No organomegaly or mass.  EXTREMITIES: No cyanosis, clubbing or edema b/l.    NEUROLOGIC: Cranial nerves II through XII are intact. No focal Motor or sensory deficits b/l.   PSYCHIATRIC:  patient is alert and oriented x 3.  SKIN: No obvious rash, lesion, or ulcer.   LABORATORY PANEL:  CBC  Recent Labs Lab 12/03/16 0402  WBC 9.3  HGB 8.8*  HCT 25.2*  PLT 136*    Chemistries   Recent Labs Lab 12/01/16 0029  12/02/16 0446  NA 128*  < > 132*  K 3.4*  < > 3.9  CL 96*  --  102  CO2 24  --  24  GLUCOSE 219*  --  167*  BUN 14  --  11  CREATININE 0.45*  --  0.54*  CALCIUM 8.0*  --  7.6*  AST 97*  --   --   ALT 62  --   --   ALKPHOS 607*  --   --   BILITOT 8.6*  --   --   < > = values in this interval not displayed. Cardiac Enzymes  Recent Labs Lab 12/01/16 0029  TROPONINI 0.03*   RADIOLOGY:  Dg Hip Operative Unilat W Or W/o Pelvis Left  Result Date: 12/01/2016 CLINICAL DATA:  Left femoral fracture EXAM: OPERATIVE LEFT HIP WITH PELVIS COMPARISON:  None. FLUOROSCOPY TIME:  Radiation Exposure Index (as provided by the fluoroscopic device): 19.09 mGy If the device does not provide the exposure index: Fluoroscopy Time:  2 minutes 54 seconds Number of Acquired Images:  6 FINDINGS: Approximation of the fracture fragments is noted. A medullary rod with proximal fixation screw is seen on the termination images with  near anatomic alignment of the fracture fragments. IMPRESSION: ORIF of proximal left femoral fracture. Electronically Signed   By: Inez Catalina M.D.   On: 12/01/2016 16:10   ASSESSMENT AND PLAN:   65 y.o. 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 Displaced subtrochanteric fracture of the left femur - Postop day 2 doing well   2. Hyponatremia and hypokalemia, chronic likely related to decreased by mouth intake -IV  normal saline with potassium - sodium was 128 improved to 132   3. Macrocytic anemia which has developed in the past month, likely related to chemotherapy -stable hgb  4. Thrombocytopenia, chronic  5. History of metastatic colorectal cancer -Continue by mouth chemotherapy  6. History of diabetes - regular insulin sliding scale coverage  7. History of hypertension and hyperlipidemia, not currently medicated -Monitor blood pressure  Social worker consult for discharge planning.  Case discussed with Care Management/Social Worker. Management plans discussed with the patient, family and they are in agreement.  CODE STATUS: Full  DVT Prophylaxis: After surgery  TOTAL TIME TAKING CARE OF THIS PATIENT: 25  minutes.  >50% time spent on counselling and coordination of care  POSSIBLE D/C [place] to 2  DAYS, DEPENDING ON CLINICAL CONDITION.  Note: This dictation was prepared with Dragon dictation along with smaller phrase technology. Any transcriptional errors that result from this process are unintentional.  Lennette Fader M.D on 12/03/2016 at 1:47 PM  Between 7am to 6pm - Pager - [phone]  After 6pm go to [email or web address] - password [email or web address] [facility]  Office  [phone]  CC: Primary care physician; Baltazar Apo, MD

## 2016-12-03 NOTE — Progress Notes (Signed)
FBS 115 no coverage at this time.

## 2016-12-03 NOTE — Progress Notes (Signed)
   Subjective: 2 Days Post-Op Procedure(s) (LRB): INTRAMEDULLARY (IM) NAIL INTERTROCHANTRIC (Left) Patient reports pain as 5 on 0-10 scale.   Patient is well, and has had no acute complaints or problems Continue with physical  therapy today.  Plan is to go Rehab after hospital stay. no nausea and no vomiting Patient denies any chest pains or shortness of breath. Objective: Vital signs in last 24 hours: Temp:  [97.9 F (36.6 C)-98.9 F (37.2 C)] 98.2 F (36.8 C) (12/25 0636) Pulse Rate:  [81-95] 84 (12/25 0636) Resp:  [16-20] 18 (12/25 0316) BP: (101-114)/(51-70) 102/55 (12/25 0636) SpO2:  [95 %-100 %] 99 % (12/25 0636) well approximated incision Heels are non tender and elevated off the bed using rolled towels Intake/Output from previous day: 12/24 0701 - 12/25 0700 In: 400 [P.O.:120; Blood:280] Out: -  Intake/Output this shift: No intake/output data recorded.   Recent Labs  12/01/16 0029 12/01/16 1717 12/02/16 0446 12/02/16 1653 12/03/16 0402  HGB 8.9* 8.4* 7.8* 8.9* 8.8*    Recent Labs  12/02/16 0446 12/02/16 1653 12/03/16 0402  WBC 9.7  --  9.3  RBC 2.16*  --  2.49*  HCT 22.3* 24.7* 25.2*  PLT 142*  --  136*    Recent Labs  12/01/16 0029 12/01/16 1005 12/02/16 0446  NA 128* 132* 132*  K 3.4* 4.0 3.9  CL 96*  --  102  CO2 24  --  24  BUN 14  --  11  CREATININE 0.45*  --  0.54*  GLUCOSE 219*  --  167*  CALCIUM 8.0*  --  7.6*    Recent Labs  12/01/16 0029  INR 1.32    EXAM General - Patient is Alert, Appropriate and Oriented Extremity - Neurologically intact Neurovascular intact Sensation intact distally Intact pulses distally Dorsiflexion/Plantar flexion intact No cellulitis present Compartment soft 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 no change since surgery Dressing - scant drainage Motor Function - intact, moving foot and toes well on exam.    Past Medical History:  Diagnosis Date  . Cataract 08/2015   forming  .  Colorectal cancer ([place]) 10/08/2016  . Diabetes mellitus without complication ([place])   . Hyperlipidemia   . Hypertension   . Platelets decreased (HCC)     Assessment/Plan: 2 Days Post-Op Procedure(s) (LRB): INTRAMEDULLARY (IM) NAIL INTERTROCHANTRIC (Left) Active Problems:   Hip fracture (HCC)  Estimated body mass index is 23.91 kg/m as calculated from the following:   Height as of this encounter: 5\' 3"  (1.6 m).   Weight as of this encounter: 61.2 kg (135 lb). Advance diet Up with therapy D/C IV fluids Discharge to SNF  Labs: reviewed DVT Prophylaxis - Lovenox, Foot Pumps and TED hose PWB left leg [facility]. Change dressing prn Continue lovenox 40mg  qd for 2 weeks post d/c   Jon R. [place] 12/03/2016, 8:50 AM

## 2016-12-03 NOTE — Progress Notes (Signed)
Patient had small BM of clear, watery, red consistency this AM. MD notified, ordered CBC and to hold lovenox. Nursing continues to monitor.

## 2016-12-03 NOTE — Progress Notes (Signed)
Patient is A&O x3, speaks some english. Up to chair and BSC with assist x1-2 with WW. Oral pain meds with good relief. Oral Chemo at bedside, patient doesn't want to hand over to nurses, MD aware. Dressing to left hip intact. CMTS, good. Bed/chair alarm on for safety.

## 2016-12-04 ENCOUNTER — Encounter: Payer: Self-pay | Admitting: Orthopedic Surgery

## 2016-12-04 DIAGNOSIS — S7222XS Displaced subtrochanteric fracture of left femur, sequela: Secondary | ICD-10-CM | POA: Diagnosis not present

## 2016-12-04 DIAGNOSIS — S7222XA Displaced subtrochanteric fracture of left femur, initial encounter for closed fracture: Secondary | ICD-10-CM | POA: Diagnosis not present

## 2016-12-04 DIAGNOSIS — R590 Localized enlarged lymph nodes: Secondary | ICD-10-CM | POA: Diagnosis not present

## 2016-12-04 DIAGNOSIS — R262 Difficulty in walking, not elsewhere classified: Secondary | ICD-10-CM | POA: Diagnosis not present

## 2016-12-04 DIAGNOSIS — C787 Secondary malignant neoplasm of liver and intrahepatic bile duct: Secondary | ICD-10-CM | POA: Diagnosis not present

## 2016-12-04 DIAGNOSIS — I10 Essential (primary) hypertension: Secondary | ICD-10-CM | POA: Diagnosis not present

## 2016-12-04 DIAGNOSIS — C189 Malignant neoplasm of colon, unspecified: Secondary | ICD-10-CM | POA: Diagnosis not present

## 2016-12-04 DIAGNOSIS — Z7401 Bed confinement status: Secondary | ICD-10-CM | POA: Diagnosis not present

## 2016-12-04 DIAGNOSIS — Z741 Need for assistance with personal care: Secondary | ICD-10-CM | POA: Diagnosis not present

## 2016-12-04 DIAGNOSIS — K2210 Ulcer of esophagus without bleeding: Secondary | ICD-10-CM | POA: Diagnosis not present

## 2016-12-04 DIAGNOSIS — D539 Nutritional anemia, unspecified: Secondary | ICD-10-CM | POA: Diagnosis not present

## 2016-12-04 DIAGNOSIS — Z515 Encounter for palliative care: Secondary | ICD-10-CM | POA: Diagnosis not present

## 2016-12-04 DIAGNOSIS — Z4789 Encounter for other orthopedic aftercare: Secondary | ICD-10-CM | POA: Diagnosis not present

## 2016-12-04 DIAGNOSIS — S7222XD Displaced subtrochanteric fracture of left femur, subsequent encounter for closed fracture with routine healing: Secondary | ICD-10-CM | POA: Diagnosis not present

## 2016-12-04 DIAGNOSIS — M25552 Pain in left hip: Secondary | ICD-10-CM | POA: Diagnosis not present

## 2016-12-04 DIAGNOSIS — Z5111 Encounter for antineoplastic chemotherapy: Secondary | ICD-10-CM | POA: Diagnosis not present

## 2016-12-04 DIAGNOSIS — G893 Neoplasm related pain (acute) (chronic): Secondary | ICD-10-CM | POA: Diagnosis not present

## 2016-12-04 DIAGNOSIS — K219 Gastro-esophageal reflux disease without esophagitis: Secondary | ICD-10-CM | POA: Diagnosis not present

## 2016-12-04 DIAGNOSIS — K769 Liver disease, unspecified: Secondary | ICD-10-CM | POA: Diagnosis not present

## 2016-12-04 DIAGNOSIS — K921 Melena: Secondary | ICD-10-CM | POA: Diagnosis not present

## 2016-12-04 DIAGNOSIS — M6281 Muscle weakness (generalized): Secondary | ICD-10-CM | POA: Diagnosis not present

## 2016-12-04 DIAGNOSIS — C229 Malignant neoplasm of liver, not specified as primary or secondary: Secondary | ICD-10-CM | POA: Diagnosis not present

## 2016-12-04 DIAGNOSIS — Z9181 History of falling: Secondary | ICD-10-CM | POA: Diagnosis not present

## 2016-12-04 DIAGNOSIS — R918 Other nonspecific abnormal finding of lung field: Secondary | ICD-10-CM | POA: Diagnosis not present

## 2016-12-04 DIAGNOSIS — E119 Type 2 diabetes mellitus without complications: Secondary | ICD-10-CM | POA: Diagnosis not present

## 2016-12-04 DIAGNOSIS — C7800 Secondary malignant neoplasm of unspecified lung: Secondary | ICD-10-CM | POA: Diagnosis not present

## 2016-12-04 DIAGNOSIS — C19 Malignant neoplasm of rectosigmoid junction: Secondary | ICD-10-CM | POA: Diagnosis not present

## 2016-12-04 DIAGNOSIS — E785 Hyperlipidemia, unspecified: Secondary | ICD-10-CM | POA: Diagnosis not present

## 2016-12-04 LAB — GLUCOSE, CAPILLARY
GLUCOSE-CAPILLARY: 181 mg/dL — AB (ref 65–99)
Glucose-Capillary: 130 mg/dL — ABNORMAL HIGH (ref 65–99)
Glucose-Capillary: 112 mg/dL — ABNORMAL HIGH (ref 65–99)

## 2016-12-04 LAB — HEMOGLOBIN A1C
HEMOGLOBIN A1C: 5.2 % (ref 4.8–5.6)
Mean Plasma Glucose: 103 mg/dL

## 2016-12-04 MED ORDER — ENOXAPARIN SODIUM 40 MG/0.4ML ~~LOC~~ SOLN: 40.0000 mg | SUBCUTANEOUS | 0 refills | Status: DC

## 2016-12-04 MED ORDER — METHOCARBAMOL 500 MG PO TABS: 500.0000 mg | ORAL_TABLET | Freq: Four times a day (QID) | ORAL | 0 refills | Status: DC | PRN

## 2016-12-04 NOTE — Progress Notes (Signed)
Physical Therapy Treatment Patient Details Name: Nathan Atkinson MRN: [id number] DOB: 04-16-1951 Today's Date: 12/04/2016    History of Present Illness Patient is a 65 yo male who was home and went to walk into the kitchen to get something and fell, his family found him on the floor and called EMS, he was admitted to [facility] and underwent surgery for IM nailing of left hip.      PT Comments    Pt agreeable to PT; reports left hip pain and requesting pain medication. Nursing notified. Family present and has questions regarding skilled nursing facilities and closest, best place to transfer pt to. Explained that SW would provide pt/family with information regarding facilities pt may transfers to and informed family that they may visit facility prior to choosing for an educated decision. Pt requests family to assist or "do" for him; pt encouraged to attempt movement first and therapist will assist as needed. Pt able to demonstrate active assisted range of motion in Left lower extremity during exercises and active motion during function. Pt movement during transfers and ambulation is slow, cautious and limited. Pt to discharge to skilled nursing facility today.   Follow Up Recommendations  SNF     Equipment Recommendations  Rolling walker with 5" wheels    Recommendations for Other Services       Precautions / Restrictions Precautions Precautions: Fall Restrictions Weight Bearing Restrictions: Yes LLE Weight Bearing: Partial weight bearing    Mobility  Bed Mobility               General bed mobility comments: Not tested; out of bed  Transfers Overall transfer level: Needs assistance Equipment used: Rolling walker (2 wheeled) Transfers: Sit to/from Stand Sit to Stand: Min assist         General transfer comment: slow to rise/sit to/from bed and BSC. Compliance with PWB on L. Cautious and requests family help/support, as well. Family standy by/CGA     Ambulation/Gait Ambulation/Gait assistance: Min assist Ambulation Distance (Feet): 5 Feet (2x) Assistive device: Rolling walker (2 wheeled) Gait Pattern/deviations: Step-to pattern;Decreased step length - left;Decreased step length - right;Antalgic;Trunk flexed (L PWB) Gait velocity: slow Gait velocity interpretation: <1.8 ft/sec, indicative of risk for recurrent falls General Gait Details: Slow, cautious with small step lengths and decreased foot clearance between bed and bedside commode.    Stairs            Wheelchair Mobility    Modified Rankin (Stroke Patients Only)       Balance Overall balance assessment: Needs assistance Sitting-balance support: Feet supported;Bilateral upper extremity supported Sitting balance-Leahy Scale: Good     Standing balance support: Bilateral upper extremity supported Standing balance-Leahy Scale: Fair                      Cognition Arousal/Alertness: Awake/alert Behavior During Therapy: WFL for tasks assessed/performed Overall Cognitive Status: Within Functional Limits for tasks assessed                      Exercises General Exercises - Lower Extremity Long Arc Quad: AROM;AAROM;Left;20 reps;Seated (R AROM) Hip ABduction/ADduction: AROM;Left;10 reps;Seated (straight leg, 2 sets) Hip Flexion/Marching: AAROM;Left;10 reps;Seated (2 sets, R AROM) Toe Raises: AROM;Both;20 reps;Seated Heel Raises: AROM;Both;20 reps;Seated    General Comments        Pertinent Vitals/Pain Pain Assessment:  (reports pain in L hip, does not quantify; requests meds) Pain Location: L hip Pain Intervention(s): Limited activity within patient's tolerance;Monitored during  session;Repositioned;Patient requesting pain meds-RN notified    Home Living                      Prior Function            PT Goals (current goals can now be found in the care plan section) Progress towards PT goals: Progressing toward goals  (slowly)    Frequency    BID      PT Plan Current plan remains appropriate    Co-evaluation             End of Session   Activity Tolerance: Patient tolerated treatment well;Patient limited by pain (self limiting; requests family/threrapist to do for him) Patient left: Other (comment);with nursing/sitter in room;with family/visitor present (seated edge of bed with CNA for bath)     Time: [id number] PT Time Calculation (min) (ACUTE ONLY): 43 min  Charges:  $Gait Training: 8-22 mins $Therapeutic Exercise: 8-22 mins $Therapeutic Activity: 8-22 mins                    G Codes:      Larae Grooms, PTA 12/04/2016, 10:45 AM

## 2016-12-04 NOTE — Discharge Summary (Addendum)
[facility] [place] NAME: Nathan Atkinson    MR#:  [id number]  DATE OF BIRTH:  1951/07/01  DATE OF ADMISSION:  11/30/2016 ADMITTING PHYSICIAN: Hessie Knows, MD  DATE OF DISCHARGE: 12/04/16  PRIMARY CARE PHYSICIAN: Baltazar Apo, MD    ADMISSION DIAGNOSIS:  Closed fracture of left hip, initial encounter ([place]) [S72.002A] Fall, initial encounter [W19.XXXA]  DISCHARGE DIAGNOSIS:  Left hip fracture s/p surgery pod #3  SECONDARY DIAGNOSIS:   Past Medical History:  Diagnosis Date  . Cataract 08/2015   forming  . Colorectal cancer ([place]) 10/08/2016  . Diabetes mellitus without complication ([place])   . Hyperlipidemia   . Hypertension   . Platelets decreased ([place])     HOSPITAL COURSE:   65 y.o.malewith a history of diabetes, hypertension, hyperlipidemia, colorectal cancer with metastases to the livernow being admitted with:  1. Displaced subtrochanteric fracture of the left femur - Postop day 3  -doing well   2. Hyponatremia and hypokalemia, chronic likely related to decreased by mouth intake -recieved IV normal saline with potassium - sodium was 128 improved to 132   3. Macrocytic anemia which has developed in the past month, likely related to chemotherapy -stable hgb after 1 unit BT  4. Thrombocytopenia, chronic  5. History of metastatic colorectal cancer -Continue oral chemotherapy  6. History of diabetes - regular insulin sliding scale coverage -A1c was 5.2  7. History of hypertension Cont enalapril  8. DVT prophylaxis per ortho 2 weeks lovenox  Overall stable for d/c to rehab  CONSULTS OBTAINED:  Treatment Team:  Corey Skains, MD Hessie Knows, MD  DRUG ALLERGIES:  No Known Allergies  DISCHARGE MEDICATIONS:   Current Discharge Medication List    START taking these medications   Details  enoxaparin (LOVENOX) 40 MG/0.4ML injection Inject 0.4 mLs (40 mg total) into the skin  daily. Qty: 14 Syringe, Refills: 0    methocarbamol (ROBAXIN) 500 MG tablet Take 1 tablet (500 mg total) by mouth every 6 (six) hours as needed for muscle spasms. Qty: 20 tablet, Refills: 0      CONTINUE these medications which have NOT CHANGED   Details  capecitabine (XELODA) 500 MG tablet Take 2 tab (1000 mg) in AM and 3 tab (1500 mg) in PM (12 hours apart) by mouth with water AFTER meal days 1-14. OFF days 15-21    dexamethasone (DECADRON) 4 MG tablet Take 8mg  (2 x 4mg  tablets) by mouth in the morning for 2 days after the first day of each cycle, then as directed.    enalapril (VASOTEC) 10 MG tablet Take 10 mg by mouth at bedtime.     feeding supplement, ENSURE ENLIVE, (ENSURE ENLIVE) LIQD Take 237 mLs by mouth 2 (two) times daily between meals. Qty: 237 mL, Refills: 12    lidocaine-prilocaine (EMLA) cream Apply cream 1 hour before chemotherapy treatment Qty: 30 g, Refills: 1    omeprazole (PRILOSEC) 20 MG capsule Take 1 capsule (20 mg total) by mouth daily. Qty: 30 capsule, Refills: 3    ondansetron (ZOFRAN) 8 MG tablet Take 1 tablet (8 mg) by mouth every 12 hours 30 minutes prior to each dose of oral chemotherapy to prevent nausea.    oxyCODONE (OXY IR/ROXICODONE) 5 MG immediate release tablet Take 1-2 tablets (5-10 mg total) by mouth every 4 (four) hours as needed for moderate pain or severe pain (mild pain). Qty: 30 tablet, Refills: 0    Polyethylene Glycol POWD Take 17 g  by mouth daily. Qty: 527 g, Refills: 5    prochlorperazine (COMPAZINE) 10 MG tablet Take by mouth.        If you experience worsening of your admission symptoms, develop shortness of breath, life threatening emergency, suicidal or homicidal thoughts you must seek medical attention immediately by calling 911 or calling your MD immediately  if symptoms less severe.  You Must read complete instructions/literature along with all the possible adverse reactions/side effects for all the Medicines you take and  that have been prescribed to you. Take any new Medicines after you have completely understood and accept all the possible adverse reactions/side effects.   Please note  You were cared for by a hospitalist during your hospital stay. If you have any questions about your discharge medications or the care you received while you were in the hospital after you are discharged, you can call the unit and asked to speak with the hospitalist on call if the hospitalist that took care of you is not available. Once you are discharged, your primary care physician will handle any further medical issues. Please note that NO REFILLS for any discharge medications will be authorized once you are discharged, as it is imperative that you return to your primary care physician (or establish a relationship with a primary care physician if you do not have one) for your aftercare needs so that they can reassess your need for medications and monitor your lab values. Today   SUBJECTIVE   Doing well Son in the room  VITAL SIGNS:  Blood pressure 116/69, pulse 96, temperature 98.7 F (37.1 C), temperature source Oral, resp. rate 19, height 5\' 3"  (1.6 m), weight 61.2 kg (135 lb), SpO2 96 %.  I/O:   Intake/Output Summary (Last 24 hours) at 12/04/16 0749 Last data filed at 12/03/16 1821  Gross per 24 hour  Intake              480 ml  Output                0 ml  Net              480 ml    PHYSICAL EXAMINATION:  GENERAL:  65 y.o.-year-old patient lying in the bed with no acute distress.  EYES: Pupils equal, round, reactive to light and accommodation. No scleral icterus. Extraocular muscles intact.  HEENT: Head atraumatic, normocephalic. Oropharynx and nasopharynx clear.  NECK:  Supple, no jugular venous distention. No thyroid enlargement, no tenderness.  LUNGS: Normal breath sounds bilaterally, no wheezing, rales,rhonchi or crepitation. No use of accessory muscles of respiration.  CARDIOVASCULAR: S1, S2 normal. No  murmurs, rubs, or gallops.  ABDOMEN: Soft, non-tender, non-distended. Bowel sounds present. No organomegaly or mass.  EXTREMITIES: No pedal edema, cyanosis, or clubbing.  NEUROLOGIC: Cranial nerves II through XII are intact. Muscle strength 5/5 in all extremities. Sensation intact. Gait not checked.  PSYCHIATRIC: The patient is alert and oriented x 3.  SKIN: No obvious rash, lesion, or ulcer.   DATA REVIEW:   CBC   Recent Labs Lab 12/03/16 0402  WBC 9.3  HGB 8.8*  HCT 25.2*  PLT 136*    Chemistries   Recent Labs Lab 12/01/16 0029  12/02/16 0446  NA 128*  < > 132*  K 3.4*  < > 3.9  CL 96*  --  102  CO2 24  --  24  GLUCOSE 219*  --  167*  BUN 14  --  11  CREATININE 0.45*  --  0.54*  CALCIUM 8.0*  --  7.6*  AST 97*  --   --   ALT 62  --   --   ALKPHOS 607*  --   --   BILITOT 8.6*  --   --   < > = values in this interval not displayed.  Microbiology Results   Recent Results (from the past 240 hour(s))  Surgical pcr screen     Status: None   Collection Time: 12/01/16  4:22 AM  Result Value Ref Range Status   MRSA, PCR NEGATIVE NEGATIVE Final   Staphylococcus aureus NEGATIVE NEGATIVE Final    Comment:        The Xpert SA Assay (FDA approved for NASAL specimens in patients over 62 years of age), is one component of a comprehensive surveillance program.  Test performance has been validated by [facility] for patients greater than or equal to 25 year old. It is not intended to diagnose infection nor to guide or monitor treatment.     RADIOLOGY:  No results found.   Management plans discussed with the patient, family and they are in agreement.  CODE STATUS:     Code Status Orders        Start     Ordered   12/01/16 0318  Full code  Continuous     12/01/16 0317    Code Status History    Date Active Date Inactive Code Status Order ID Comments User Context   12/01/2016  1:22 AM 12/01/2016  3:17 AM Full Code [id number]  Hessie Knows, MD ED    10/24/2016  1:39 PM 10/27/2016  6:07 PM Full Code [id number]  Dustin Flock, MD Inpatient      TOTAL TIME TAKING CARE OF THIS PATIENT:40 minutes.    Jonnie Truxillo M.D on 12/04/2016 at 7:49 AM  Between 7am to 6pm - Pager - [phone] After 6pm go to [email or web address] - password [email or web address] [facility]  Office  [phone]  CC: Primary care physician; Baltazar Apo, MD

## 2016-12-04 NOTE — Progress Notes (Signed)
   Subjective: 3 Days Post-Op Procedure(s) (LRB): INTRAMEDULLARY (IM) NAIL INTERTROCHANTRIC (Left) Patient reports pain as mild.   Patient is well, and has had no acute complaints or problems Continue with physical therapy today.  Plan is to go Rehab after hospital stay. no nausea and no vomiting Patient denies any chest pains or shortness of breath. Objective: Vital signs in last 24 hours: Temp:  [98 F (36.7 C)-98.7 F (37.1 C)] 98.7 F (37.1 C) (12/26 0419) Pulse Rate:  [88-96] 96 (12/26 0419) Resp:  [18-19] 19 (12/26 0419) BP: (111-116)/(62-69) 116/69 (12/26 0419) SpO2:  [96 %-100 %] 96 % (12/26 0419) well approximated incision Heels are non tender and elevated off the bed using rolled towels Intake/Output from previous day: 12/25 0701 - 12/26 0700 In: 480 [P.O.:480] Out: -  Intake/Output this shift: No intake/output data recorded.   Recent Labs  12/01/16 1717 12/02/16 0446 12/02/16 1653 12/03/16 0402  HGB 8.4* 7.8* 8.9* 8.8*    Recent Labs  12/02/16 0446 12/02/16 1653 12/03/16 0402  WBC 9.7  --  9.3  RBC 2.16*  --  2.49*  HCT 22.3* 24.7* 25.2*  PLT 142*  --  136*    Recent Labs  12/01/16 1005 12/02/16 0446  NA 132* 132*  K 4.0 3.9  CL  --  102  CO2  --  24  BUN  --  11  CREATININE  --  0.54*  GLUCOSE  --  167*  CALCIUM  --  7.6*   No results for input(s): LABPT, INR in the last 72 hours.  EXAM General - Patient is Alert, Appropriate and Oriented Extremity - Neurologically intact Neurovascular intact Sensation intact distally Intact pulses distally Dorsiflexion/Plantar flexion intact No cellulitis present Compartment soft Dressing - moderate drainage;  Motor Function - intact, moving foot and toes well on exam.    Past Medical History:  Diagnosis Date  . Cataract 08/2015   forming  . Colorectal cancer ([place]) 10/08/2016  . Diabetes mellitus without complication ([place])   . Hyperlipidemia   . Hypertension   . Platelets decreased ([facility])      Assessment/Plan: 3 Days Post-Op Procedure(s) (LRB): INTRAMEDULLARY (IM) NAIL INTERTROCHANTRIC (Left) Active Problems:   Hip fracture ([facility])  Estimated body mass index is 23.91 kg/m as calculated from the following:   Height as of this encounter: 5\' 3"  (1.6 m).   Weight as of this encounter: 61.2 kg (135 lb). Up with therapy Discharge to SNF when cleared by medicine  Labs: reviewed. hgb improved DVT Prophylaxis - Lovenox, Foot Pumps and TED hose PWB left leg Please change dressing today Follow up in [facility] in 2 weeks. Change dressing prn Continue lovenox 40mg  qd for 2 weeks post d/c  Jon R. [place] 12/04/2016, 7:34 AM

## 2016-12-04 NOTE — Progress Notes (Signed)
Patient is being discharged to [facility]. IV removed with cath intact. Dressing changed to left hip area, staples are intact and approximated. No redness noted. Called EMS for transport. Report given to nurse at [facility].

## 2016-12-04 NOTE — Clinical Social Work Placement (Signed)
   CLINICAL SOCIAL WORK PLACEMENT  NOTE  Date:  12/04/2016  Patient Details  Name: Nathan Atkinson MRN: [id number] Date of Birth: 02-28-1951  Clinical Social Work is seeking post-discharge placement for this patient at the [place] level of care (*CSW will initial, date and re-position this form in  chart as items are completed):  Yes   Patient/family provided with [facility] Work Department's list of facilities offering this level of care within the geographic area requested by the patient (or if unable, by the patient's family).  Yes   Patient/family informed of their freedom to choose among providers that offer the needed level of care, that participate in Medicare, Medicaid or managed care program needed by the patient, have an available bed and are willing to accept the patient.  Yes   Patient/family informed of [place]'s ownership interest in [facility] and [facility], as well as of the fact that they are under no obligation to receive care at these facilities.  PASRR submitted to EDS on 12/02/16     PASRR number received on 12/02/16     Existing PASRR number confirmed on       FL2 transmitted to all facilities in geographic area requested by pt/family on 12/02/16     FL2 transmitted to all facilities within larger geographic area on       Patient informed that his/her managed care company has contracts with or will negotiate with certain facilities, including the following:        Yes   Patient/family informed of bed offers received.  Patient chooses bed at  [facility] )     Physician recommends and patient chooses bed at      Patient to be transferred to  [facility] ) on 12/04/16.  Patient to be transferred to facility by  [facility] )     Patient family notified on 12/04/16 of transfer.  Name of family member notified:   ([name] is aware of D/C today. )      PHYSICIAN       Additional Comment:    _______________________________________________ Wardell Pokorski, [place] Beets, LCSW 12/04/2016, 4:21 PM

## 2016-12-04 NOTE — Anesthesia Postprocedure Evaluation (Signed)
Anesthesia Post Note  Patient: Nathan Atkinson  Procedure(s) Performed: Procedure(s) (LRB): INTRAMEDULLARY (IM) NAIL INTERTROCHANTRIC (Left)  Patient location during evaluation: PACU Anesthesia Type: General Level of consciousness: awake and alert Pain management: pain level controlled Vital Signs Assessment: post-procedure vital signs reviewed and stable Respiratory status: spontaneous breathing, nonlabored ventilation, respiratory function stable and patient connected to nasal cannula oxygen Cardiovascular status: blood pressure returned to baseline and stable Postop Assessment: no signs of nausea or vomiting Anesthetic complications: no     Last Vitals:  Vitals:   12/04/16 0419 12/04/16 0753  BP: 116/69 (!) 106/59  Pulse: 96 91  Resp: 19 18  Temp: 37.1 C 36.7 C    Last Pain:  Vitals:   12/04/16 0753  TempSrc: Oral  PainSc:                  Molli Barrows

## 2016-12-04 NOTE — Progress Notes (Signed)
Patient is medically stable for D/C to [facility] today. Per [facility] admissions coordinator at [place] they have started authorization with [facility] and will accept a 5 day LOG today and patient will go to room 3-B. Clinical Social Work (CSW) [employer] approved 5 day LOG. CSW contacted [facility] and was informed that patient will be responsible for 40% of the total bill at [facility]. CSW made patient and his son Allena Katz aware of above. Per son they will make payments. RN will call report and arrange EMS for transport. CSW informed patient and son Allena Katz that if [facility] denies SNF stay then patient will have to D/C home after 5 days at [facility]. Son verbalized his understanding. Please reconsult if future social work needs arise. CSW signing off.   [employer], LCSW [phone]

## 2016-12-05 ENCOUNTER — Encounter: Payer: Self-pay | Admitting: Hematology and Oncology

## 2016-12-05 LAB — HEMOGLOBIN A1C
HEMOGLOBIN A1C: 5.4 % (ref 4.8–5.6)
MEAN PLASMA GLUCOSE: 108 mg/dL

## 2016-12-06 DIAGNOSIS — E119 Type 2 diabetes mellitus without complications: Secondary | ICD-10-CM | POA: Diagnosis not present

## 2016-12-06 DIAGNOSIS — Z4789 Encounter for other orthopedic aftercare: Secondary | ICD-10-CM | POA: Diagnosis not present

## 2016-12-06 DIAGNOSIS — E785 Hyperlipidemia, unspecified: Secondary | ICD-10-CM | POA: Diagnosis not present

## 2016-12-06 DIAGNOSIS — K219 Gastro-esophageal reflux disease without esophagitis: Secondary | ICD-10-CM | POA: Diagnosis not present

## 2016-12-06 DIAGNOSIS — I10 Essential (primary) hypertension: Secondary | ICD-10-CM | POA: Diagnosis not present

## 2016-12-06 DIAGNOSIS — C189 Malignant neoplasm of colon, unspecified: Secondary | ICD-10-CM | POA: Diagnosis not present

## 2016-12-08 DIAGNOSIS — Z4789 Encounter for other orthopedic aftercare: Secondary | ICD-10-CM | POA: Diagnosis not present

## 2016-12-08 DIAGNOSIS — C19 Malignant neoplasm of rectosigmoid junction: Secondary | ICD-10-CM | POA: Diagnosis not present

## 2016-12-08 DIAGNOSIS — I10 Essential (primary) hypertension: Secondary | ICD-10-CM | POA: Diagnosis not present

## 2016-12-11 DIAGNOSIS — C19 Malignant neoplasm of rectosigmoid junction: Secondary | ICD-10-CM | POA: Diagnosis not present

## 2016-12-11 DIAGNOSIS — K921 Melena: Secondary | ICD-10-CM | POA: Diagnosis not present

## 2016-12-11 DIAGNOSIS — G893 Neoplasm related pain (acute) (chronic): Secondary | ICD-10-CM | POA: Diagnosis not present

## 2016-12-11 DIAGNOSIS — C7800 Secondary malignant neoplasm of unspecified lung: Secondary | ICD-10-CM | POA: Diagnosis not present

## 2016-12-11 DIAGNOSIS — R918 Other nonspecific abnormal finding of lung field: Secondary | ICD-10-CM | POA: Diagnosis not present

## 2016-12-11 DIAGNOSIS — K769 Liver disease, unspecified: Secondary | ICD-10-CM | POA: Diagnosis not present

## 2016-12-11 DIAGNOSIS — Z515 Encounter for palliative care: Secondary | ICD-10-CM | POA: Diagnosis not present

## 2016-12-11 DIAGNOSIS — C787 Secondary malignant neoplasm of liver and intrahepatic bile duct: Secondary | ICD-10-CM | POA: Diagnosis not present

## 2016-12-11 DIAGNOSIS — Z5111 Encounter for antineoplastic chemotherapy: Secondary | ICD-10-CM | POA: Diagnosis not present

## 2016-12-11 DIAGNOSIS — K2210 Ulcer of esophagus without bleeding: Secondary | ICD-10-CM | POA: Diagnosis not present

## 2016-12-11 DIAGNOSIS — R590 Localized enlarged lymph nodes: Secondary | ICD-10-CM | POA: Diagnosis not present

## 2016-12-31 DIAGNOSIS — I10 Essential (primary) hypertension: Secondary | ICD-10-CM | POA: Diagnosis not present

## 2016-12-31 DIAGNOSIS — S7222XD Displaced subtrochanteric fracture of left femur, subsequent encounter for closed fracture with routine healing: Secondary | ICD-10-CM | POA: Diagnosis not present

## 2016-12-31 DIAGNOSIS — D539 Nutritional anemia, unspecified: Secondary | ICD-10-CM | POA: Diagnosis not present

## 2016-12-31 DIAGNOSIS — E785 Hyperlipidemia, unspecified: Secondary | ICD-10-CM | POA: Diagnosis not present

## 2016-12-31 DIAGNOSIS — C787 Secondary malignant neoplasm of liver and intrahepatic bile duct: Secondary | ICD-10-CM | POA: Diagnosis not present

## 2016-12-31 DIAGNOSIS — E119 Type 2 diabetes mellitus without complications: Secondary | ICD-10-CM | POA: Diagnosis not present

## 2016-12-31 DIAGNOSIS — E43 Unspecified severe protein-calorie malnutrition: Secondary | ICD-10-CM | POA: Diagnosis not present

## 2016-12-31 DIAGNOSIS — C19 Malignant neoplasm of rectosigmoid junction: Secondary | ICD-10-CM | POA: Diagnosis not present

## 2016-12-31 DIAGNOSIS — D696 Thrombocytopenia, unspecified: Secondary | ICD-10-CM | POA: Diagnosis not present

## 2017-01-01 DIAGNOSIS — E1165 Type 2 diabetes mellitus with hyperglycemia: Secondary | ICD-10-CM | POA: Diagnosis not present

## 2017-01-01 DIAGNOSIS — C19 Malignant neoplasm of rectosigmoid junction: Secondary | ICD-10-CM | POA: Diagnosis not present

## 2017-01-01 DIAGNOSIS — E785 Hyperlipidemia, unspecified: Secondary | ICD-10-CM | POA: Diagnosis not present

## 2017-01-01 DIAGNOSIS — R110 Nausea: Secondary | ICD-10-CM | POA: Diagnosis not present

## 2017-01-01 DIAGNOSIS — C7800 Secondary malignant neoplasm of unspecified lung: Secondary | ICD-10-CM | POA: Diagnosis not present

## 2017-01-01 DIAGNOSIS — C772 Secondary and unspecified malignant neoplasm of intra-abdominal lymph nodes: Secondary | ICD-10-CM | POA: Diagnosis not present

## 2017-01-01 DIAGNOSIS — G893 Neoplasm related pain (acute) (chronic): Secondary | ICD-10-CM | POA: Diagnosis not present

## 2017-01-01 DIAGNOSIS — I10 Essential (primary) hypertension: Secondary | ICD-10-CM | POA: Diagnosis not present

## 2017-01-01 DIAGNOSIS — C787 Secondary malignant neoplasm of liver and intrahepatic bile duct: Secondary | ICD-10-CM | POA: Diagnosis not present

## 2017-01-01 DIAGNOSIS — R1013 Epigastric pain: Secondary | ICD-10-CM | POA: Diagnosis not present

## 2017-01-01 DIAGNOSIS — Z5111 Encounter for antineoplastic chemotherapy: Secondary | ICD-10-CM | POA: Diagnosis not present

## 2017-01-01 DIAGNOSIS — K5903 Drug induced constipation: Secondary | ICD-10-CM | POA: Diagnosis not present

## 2017-01-02 ENCOUNTER — Telehealth: Payer: Self-pay | Admitting: *Deleted

## 2017-01-02 DIAGNOSIS — Z4789 Encounter for other orthopedic aftercare: Secondary | ICD-10-CM | POA: Diagnosis not present

## 2017-01-02 DIAGNOSIS — M6281 Muscle weakness (generalized): Secondary | ICD-10-CM | POA: Diagnosis not present

## 2017-01-02 DIAGNOSIS — C229 Malignant neoplasm of liver, not specified as primary or secondary: Secondary | ICD-10-CM | POA: Diagnosis not present

## 2017-01-02 DIAGNOSIS — C189 Malignant neoplasm of colon, unspecified: Secondary | ICD-10-CM | POA: Diagnosis not present

## 2017-01-02 DIAGNOSIS — S7222XD Displaced subtrochanteric fracture of left femur, subsequent encounter for closed fracture with routine healing: Secondary | ICD-10-CM | POA: Diagnosis not present

## 2017-01-02 NOTE — Telephone Encounter (Signed)
Called to see if we had done any [facility] or KRAS testing as per conversation Dr Mike Gip had with Dr Berneice Gandy. I find nothing in his chart and Pathology states have did not receive an order for it to be done. Please advise and call Freda Munro at Dr Rushie Chestnut office regarding this. [phone]

## 2017-01-03 ENCOUNTER — Telehealth: Payer: Self-pay

## 2017-01-03 DIAGNOSIS — E43 Unspecified severe protein-calorie malnutrition: Secondary | ICD-10-CM | POA: Diagnosis not present

## 2017-01-03 DIAGNOSIS — D696 Thrombocytopenia, unspecified: Secondary | ICD-10-CM | POA: Diagnosis not present

## 2017-01-03 DIAGNOSIS — I10 Essential (primary) hypertension: Secondary | ICD-10-CM | POA: Diagnosis not present

## 2017-01-03 DIAGNOSIS — C787 Secondary malignant neoplasm of liver and intrahepatic bile duct: Secondary | ICD-10-CM | POA: Diagnosis not present

## 2017-01-03 DIAGNOSIS — S7222XD Displaced subtrochanteric fracture of left femur, subsequent encounter for closed fracture with routine healing: Secondary | ICD-10-CM | POA: Diagnosis not present

## 2017-01-03 DIAGNOSIS — E119 Type 2 diabetes mellitus without complications: Secondary | ICD-10-CM | POA: Diagnosis not present

## 2017-01-03 DIAGNOSIS — E785 Hyperlipidemia, unspecified: Secondary | ICD-10-CM | POA: Diagnosis not present

## 2017-01-03 DIAGNOSIS — C19 Malignant neoplasm of rectosigmoid junction: Secondary | ICD-10-CM | POA: Diagnosis not present

## 2017-01-03 DIAGNOSIS — D539 Nutritional anemia, unspecified: Secondary | ICD-10-CM | POA: Diagnosis not present

## 2017-01-03 NOTE — Telephone Encounter (Signed)
I rechecked with Pathology this morning and they checked again and state no orders were ever received and nothing got sent out for testing of KRAS, BRAF, MMR, MSI, NRAF

## 2017-01-03 NOTE — Telephone Encounter (Signed)
  Orders sent on 10/08/2016.  M

## 2017-01-03 NOTE — Telephone Encounter (Signed)
Disability Paperwork has been received and a $25.00 collection fee has been obtained.  

## 2017-01-03 NOTE — Telephone Encounter (Signed)
Called patient and had to speak with his wife-Nathan Atkinson since she stated that her husband was at physical therapy at this time. I told [name] to tell her husband to give me a call. She stated that she would tell him to call me back.  When patient calls in, please schedule a follow up visit with Dr. [name] to be seen for Colorectal cancer.

## 2017-01-03 NOTE — Telephone Encounter (Signed)
Patient's disability medical request from [employer] was faxed.

## 2017-01-04 DIAGNOSIS — S7222XD Displaced subtrochanteric fracture of left femur, subsequent encounter for closed fracture with routine healing: Secondary | ICD-10-CM | POA: Diagnosis not present

## 2017-01-04 DIAGNOSIS — E119 Type 2 diabetes mellitus without complications: Secondary | ICD-10-CM | POA: Diagnosis not present

## 2017-01-04 DIAGNOSIS — D696 Thrombocytopenia, unspecified: Secondary | ICD-10-CM | POA: Diagnosis not present

## 2017-01-04 DIAGNOSIS — E785 Hyperlipidemia, unspecified: Secondary | ICD-10-CM | POA: Diagnosis not present

## 2017-01-04 DIAGNOSIS — I10 Essential (primary) hypertension: Secondary | ICD-10-CM | POA: Diagnosis not present

## 2017-01-04 DIAGNOSIS — C787 Secondary malignant neoplasm of liver and intrahepatic bile duct: Secondary | ICD-10-CM | POA: Diagnosis not present

## 2017-01-04 DIAGNOSIS — C19 Malignant neoplasm of rectosigmoid junction: Secondary | ICD-10-CM | POA: Diagnosis not present

## 2017-01-04 DIAGNOSIS — D539 Nutritional anemia, unspecified: Secondary | ICD-10-CM | POA: Diagnosis not present

## 2017-01-04 DIAGNOSIS — E43 Unspecified severe protein-calorie malnutrition: Secondary | ICD-10-CM | POA: Diagnosis not present

## 2017-01-07 DIAGNOSIS — E119 Type 2 diabetes mellitus without complications: Secondary | ICD-10-CM | POA: Diagnosis not present

## 2017-01-07 DIAGNOSIS — C787 Secondary malignant neoplasm of liver and intrahepatic bile duct: Secondary | ICD-10-CM | POA: Diagnosis not present

## 2017-01-07 DIAGNOSIS — D539 Nutritional anemia, unspecified: Secondary | ICD-10-CM | POA: Diagnosis not present

## 2017-01-07 DIAGNOSIS — E43 Unspecified severe protein-calorie malnutrition: Secondary | ICD-10-CM | POA: Diagnosis not present

## 2017-01-07 DIAGNOSIS — Z8781 Personal history of (healed) traumatic fracture: Secondary | ICD-10-CM | POA: Diagnosis not present

## 2017-01-07 DIAGNOSIS — D696 Thrombocytopenia, unspecified: Secondary | ICD-10-CM | POA: Diagnosis not present

## 2017-01-07 DIAGNOSIS — Z967 Presence of other bone and tendon implants: Secondary | ICD-10-CM | POA: Diagnosis not present

## 2017-01-07 DIAGNOSIS — S7222XD Displaced subtrochanteric fracture of left femur, subsequent encounter for closed fracture with routine healing: Secondary | ICD-10-CM | POA: Diagnosis not present

## 2017-01-07 DIAGNOSIS — C19 Malignant neoplasm of rectosigmoid junction: Secondary | ICD-10-CM | POA: Diagnosis not present

## 2017-01-07 DIAGNOSIS — I10 Essential (primary) hypertension: Secondary | ICD-10-CM | POA: Diagnosis not present

## 2017-01-07 DIAGNOSIS — E785 Hyperlipidemia, unspecified: Secondary | ICD-10-CM | POA: Diagnosis not present

## 2017-01-08 DIAGNOSIS — E119 Type 2 diabetes mellitus without complications: Secondary | ICD-10-CM | POA: Diagnosis not present

## 2017-01-08 DIAGNOSIS — E785 Hyperlipidemia, unspecified: Secondary | ICD-10-CM | POA: Diagnosis not present

## 2017-01-08 DIAGNOSIS — S7222XD Displaced subtrochanteric fracture of left femur, subsequent encounter for closed fracture with routine healing: Secondary | ICD-10-CM | POA: Diagnosis not present

## 2017-01-08 DIAGNOSIS — C19 Malignant neoplasm of rectosigmoid junction: Secondary | ICD-10-CM | POA: Diagnosis not present

## 2017-01-08 DIAGNOSIS — D696 Thrombocytopenia, unspecified: Secondary | ICD-10-CM | POA: Diagnosis not present

## 2017-01-08 DIAGNOSIS — C20 Malignant neoplasm of rectum: Secondary | ICD-10-CM | POA: Diagnosis not present

## 2017-01-08 DIAGNOSIS — D539 Nutritional anemia, unspecified: Secondary | ICD-10-CM | POA: Diagnosis not present

## 2017-01-08 DIAGNOSIS — I10 Essential (primary) hypertension: Secondary | ICD-10-CM | POA: Diagnosis not present

## 2017-01-08 DIAGNOSIS — C787 Secondary malignant neoplasm of liver and intrahepatic bile duct: Secondary | ICD-10-CM | POA: Diagnosis not present

## 2017-01-08 DIAGNOSIS — E43 Unspecified severe protein-calorie malnutrition: Secondary | ICD-10-CM | POA: Diagnosis not present

## 2017-01-09 DIAGNOSIS — C19 Malignant neoplasm of rectosigmoid junction: Secondary | ICD-10-CM | POA: Diagnosis not present

## 2017-01-09 DIAGNOSIS — I10 Essential (primary) hypertension: Secondary | ICD-10-CM | POA: Diagnosis not present

## 2017-01-09 DIAGNOSIS — E785 Hyperlipidemia, unspecified: Secondary | ICD-10-CM | POA: Diagnosis not present

## 2017-01-09 DIAGNOSIS — E43 Unspecified severe protein-calorie malnutrition: Secondary | ICD-10-CM | POA: Diagnosis not present

## 2017-01-09 DIAGNOSIS — D539 Nutritional anemia, unspecified: Secondary | ICD-10-CM | POA: Diagnosis not present

## 2017-01-09 DIAGNOSIS — S7222XD Displaced subtrochanteric fracture of left femur, subsequent encounter for closed fracture with routine healing: Secondary | ICD-10-CM | POA: Diagnosis not present

## 2017-01-09 DIAGNOSIS — E119 Type 2 diabetes mellitus without complications: Secondary | ICD-10-CM | POA: Diagnosis not present

## 2017-01-09 DIAGNOSIS — C787 Secondary malignant neoplasm of liver and intrahepatic bile duct: Secondary | ICD-10-CM | POA: Diagnosis not present

## 2017-01-09 DIAGNOSIS — D696 Thrombocytopenia, unspecified: Secondary | ICD-10-CM | POA: Diagnosis not present

## 2017-01-11 ENCOUNTER — Inpatient Hospital Stay
Admission: EM | Admit: 2017-01-11 | Discharge: 2017-01-16 | DRG: 542 | Disposition: A | Discharge status: Hospice | Payer: 59 | Attending: Internal Medicine | Admitting: Internal Medicine

## 2017-01-11 ENCOUNTER — Emergency Department: Payer: 59

## 2017-01-11 ENCOUNTER — Encounter: Payer: Self-pay | Admitting: Emergency Medicine

## 2017-01-11 DIAGNOSIS — K529 Noninfective gastroenteritis and colitis, unspecified: Secondary | ICD-10-CM | POA: Diagnosis present

## 2017-01-11 DIAGNOSIS — E43 Unspecified severe protein-calorie malnutrition: Secondary | ICD-10-CM | POA: Diagnosis present

## 2017-01-11 DIAGNOSIS — R627 Adult failure to thrive: Secondary | ICD-10-CM | POA: Diagnosis present

## 2017-01-11 DIAGNOSIS — S42412A Displaced simple supracondylar fracture without intercondylar fracture of left humerus, initial encounter for closed fracture: Secondary | ICD-10-CM | POA: Diagnosis present

## 2017-01-11 DIAGNOSIS — M84522A Pathological fracture in neoplastic disease, left humerus, initial encounter for fracture: Secondary | ICD-10-CM | POA: Diagnosis not present

## 2017-01-11 DIAGNOSIS — M84422A Pathological fracture, left humerus, initial encounter for fracture: Principal | ICD-10-CM | POA: Diagnosis present

## 2017-01-11 DIAGNOSIS — G893 Neoplasm related pain (acute) (chronic): Secondary | ICD-10-CM | POA: Diagnosis present

## 2017-01-11 DIAGNOSIS — C799 Secondary malignant neoplasm of unspecified site: Secondary | ICD-10-CM

## 2017-01-11 DIAGNOSIS — E860 Dehydration: Secondary | ICD-10-CM | POA: Diagnosis present

## 2017-01-11 DIAGNOSIS — E222 Syndrome of inappropriate secretion of antidiuretic hormone: Secondary | ICD-10-CM | POA: Diagnosis present

## 2017-01-11 DIAGNOSIS — C7951 Secondary malignant neoplasm of bone: Secondary | ICD-10-CM | POA: Diagnosis not present

## 2017-01-11 DIAGNOSIS — R112 Nausea with vomiting, unspecified: Secondary | ICD-10-CM

## 2017-01-11 DIAGNOSIS — Z66 Do not resuscitate: Secondary | ICD-10-CM | POA: Diagnosis present

## 2017-01-11 DIAGNOSIS — Z9889 Other specified postprocedural states: Secondary | ICD-10-CM | POA: Diagnosis not present

## 2017-01-11 DIAGNOSIS — E871 Hypo-osmolality and hyponatremia: Secondary | ICD-10-CM | POA: Diagnosis not present

## 2017-01-11 DIAGNOSIS — S5292XA Unspecified fracture of left forearm, initial encounter for closed fracture: Secondary | ICD-10-CM | POA: Diagnosis not present

## 2017-01-11 DIAGNOSIS — Z809 Family history of malignant neoplasm, unspecified: Secondary | ICD-10-CM

## 2017-01-11 DIAGNOSIS — C787 Secondary malignant neoplasm of liver and intrahepatic bile duct: Secondary | ICD-10-CM | POA: Diagnosis not present

## 2017-01-11 DIAGNOSIS — C19 Malignant neoplasm of rectosigmoid junction: Secondary | ICD-10-CM | POA: Diagnosis not present

## 2017-01-11 DIAGNOSIS — K6389 Other specified diseases of intestine: Secondary | ICD-10-CM | POA: Diagnosis not present

## 2017-01-11 DIAGNOSIS — Z515 Encounter for palliative care: Secondary | ICD-10-CM | POA: Diagnosis not present

## 2017-01-11 DIAGNOSIS — R918 Other nonspecific abnormal finding of lung field: Secondary | ICD-10-CM | POA: Diagnosis not present

## 2017-01-11 DIAGNOSIS — B370 Candidal stomatitis: Secondary | ICD-10-CM | POA: Diagnosis present

## 2017-01-11 DIAGNOSIS — D696 Thrombocytopenia, unspecified: Secondary | ICD-10-CM | POA: Diagnosis not present

## 2017-01-11 DIAGNOSIS — E785 Hyperlipidemia, unspecified: Secondary | ICD-10-CM | POA: Diagnosis not present

## 2017-01-11 DIAGNOSIS — Z7189 Other specified counseling: Secondary | ICD-10-CM

## 2017-01-11 DIAGNOSIS — S42309A Unspecified fracture of shaft of humerus, unspecified arm, initial encounter for closed fracture: Secondary | ICD-10-CM

## 2017-01-11 DIAGNOSIS — Z9221 Personal history of antineoplastic chemotherapy: Secondary | ICD-10-CM | POA: Diagnosis not present

## 2017-01-11 DIAGNOSIS — D649 Anemia, unspecified: Secondary | ICD-10-CM | POA: Diagnosis present

## 2017-01-11 DIAGNOSIS — S42302A Unspecified fracture of shaft of humerus, left arm, initial encounter for closed fracture: Secondary | ICD-10-CM | POA: Diagnosis not present

## 2017-01-11 DIAGNOSIS — S4992XA Unspecified injury of left shoulder and upper arm, initial encounter: Secondary | ICD-10-CM | POA: Diagnosis not present

## 2017-01-11 DIAGNOSIS — C20 Malignant neoplasm of rectum: Secondary | ICD-10-CM | POA: Diagnosis not present

## 2017-01-11 DIAGNOSIS — E119 Type 2 diabetes mellitus without complications: Secondary | ICD-10-CM | POA: Diagnosis present

## 2017-01-11 DIAGNOSIS — M79602 Pain in left arm: Secondary | ICD-10-CM

## 2017-01-11 DIAGNOSIS — S7222XD Displaced subtrochanteric fracture of left femur, subsequent encounter for closed fracture with routine healing: Secondary | ICD-10-CM | POA: Diagnosis not present

## 2017-01-11 DIAGNOSIS — M6281 Muscle weakness (generalized): Secondary | ICD-10-CM

## 2017-01-11 DIAGNOSIS — R197 Diarrhea, unspecified: Secondary | ICD-10-CM | POA: Diagnosis not present

## 2017-01-11 DIAGNOSIS — Z7401 Bed confinement status: Secondary | ICD-10-CM | POA: Diagnosis not present

## 2017-01-11 DIAGNOSIS — Z87891 Personal history of nicotine dependence: Secondary | ICD-10-CM | POA: Diagnosis not present

## 2017-01-11 DIAGNOSIS — S42402S Unspecified fracture of lower end of left humerus, sequela: Secondary | ICD-10-CM | POA: Diagnosis not present

## 2017-01-11 DIAGNOSIS — R630 Anorexia: Secondary | ICD-10-CM | POA: Diagnosis not present

## 2017-01-11 DIAGNOSIS — I10 Essential (primary) hypertension: Secondary | ICD-10-CM | POA: Diagnosis not present

## 2017-01-11 DIAGNOSIS — Z79899 Other long term (current) drug therapy: Secondary | ICD-10-CM | POA: Diagnosis not present

## 2017-01-11 DIAGNOSIS — C7800 Secondary malignant neoplasm of unspecified lung: Secondary | ICD-10-CM | POA: Diagnosis present

## 2017-01-11 DIAGNOSIS — D539 Nutritional anemia, unspecified: Secondary | ICD-10-CM | POA: Diagnosis not present

## 2017-01-11 DIAGNOSIS — C189 Malignant neoplasm of colon, unspecified: Secondary | ICD-10-CM

## 2017-01-11 LAB — COMPREHENSIVE METABOLIC PANEL
ALK PHOS: 387 U/L — AB (ref 38–126)
ALT: 27 U/L (ref 17–63)
ANION GAP: 11 (ref 5–15)
AST: 61 U/L — ABNORMAL HIGH (ref 15–41)
Albumin: 1.8 g/dL — ABNORMAL LOW (ref 3.5–5.0)
BUN: 47 mg/dL — ABNORMAL HIGH (ref 6–20)
CALCIUM: 7.7 mg/dL — AB (ref 8.9–10.3)
CHLORIDE: 91 mmol/L — AB (ref 101–111)
CO2: 19 mmol/L — AB (ref 22–32)
Creatinine, Ser: 1.37 mg/dL — ABNORMAL HIGH (ref 0.61–1.24)
GFR calc non Af Amer: 53 mL/min — ABNORMAL LOW (ref >60)
Glucose, Bld: 196 mg/dL — ABNORMAL HIGH (ref 65–99)
POTASSIUM: 4.1 mmol/L (ref 3.5–5.1)
SODIUM: 121 mmol/L — AB (ref 135–145)
Total Bilirubin: 3.3 mg/dL — ABNORMAL HIGH (ref 0.3–1.2)
Total Protein: 5.5 g/dL — ABNORMAL LOW (ref 6.5–8.1)

## 2017-01-11 LAB — CBC WITH DIFFERENTIAL/PLATELET
Basophils Absolute: 0 10*3/uL (ref 0–0.1)
Basophils Relative: 0 %
EOS PCT: 0 %
Eosinophils Absolute: 0 10*3/uL (ref 0–0.7)
HCT: 32.7 % — ABNORMAL LOW (ref 40.0–52.0)
Hemoglobin: 11.5 g/dL — ABNORMAL LOW (ref 13.0–18.0)
LYMPHS ABS: 0.8 10*3/uL — AB (ref 1.0–3.6)
Lymphocytes Relative: 14 %
MCH: 38.2 pg — AB (ref 26.0–34.0)
MCHC: 35.2 g/dL (ref 32.0–36.0)
MCV: 108.6 fL — ABNORMAL HIGH (ref 80.0–100.0)
MONO ABS: 0.1 10*3/uL — AB (ref 0.2–1.0)
MONOS PCT: 2 %
Neutro Abs: 4.9 10*3/uL (ref 1.4–6.5)
Neutrophils Relative %: 84 %
PLATELETS: 62 10*3/uL — AB (ref 150–440)
RBC: 3.01 MIL/uL — ABNORMAL LOW (ref 4.40–5.90)
RDW: 22.7 % — AB (ref 11.5–14.5)
WBC: 5.8 10*3/uL (ref 3.8–10.6)

## 2017-01-11 LAB — TROPONIN I: Troponin I: <0.03 ng/mL (ref <0.03)

## 2017-01-11 MED ORDER — ACETAMINOPHEN 650 MG RE SUPP: 650.0000 mg | Freq: Four times a day (QID) | RECTAL | Status: DC | PRN

## 2017-01-11 MED ORDER — DOCUSATE SODIUM 100 MG PO CAPS: 100.0000 mg | ORAL_CAPSULE | Freq: Two times a day (BID) | ORAL | Status: DC

## 2017-01-11 MED ORDER — ENOXAPARIN SODIUM 40 MG/0.4ML ~~LOC~~ SOLN
40.0000 mg | SUBCUTANEOUS | Status: DC
  Administered 2017-01-11 – 2017-01-14 (×3): 40 mg via SUBCUTANEOUS
  Filled 2017-01-11 (×3): qty 0.4

## 2017-01-11 MED ORDER — ACETAMINOPHEN 325 MG PO TABS: 650.0000 mg | ORAL_TABLET | Freq: Four times a day (QID) | ORAL | Status: DC | PRN

## 2017-01-11 MED ORDER — ONDANSETRON HCL 4 MG PO TABS: 4.0000 mg | ORAL_TABLET | Freq: Four times a day (QID) | ORAL | Status: DC | PRN

## 2017-01-11 MED ORDER — METHOCARBAMOL 500 MG PO TABS: 500.0000 mg | ORAL_TABLET | Freq: Four times a day (QID) | ORAL | Status: DC | PRN

## 2017-01-11 MED ORDER — ENSURE ENLIVE PO LIQD: 237.0000 mL | Freq: Two times a day (BID) | ORAL | Status: DC

## 2017-01-11 MED ORDER — HYDROCODONE-ACETAMINOPHEN 5-325 MG PO TABS
1.0000 | ORAL_TABLET | ORAL | Status: DC | PRN
Start: 2017-01-11 — End: 2017-01-14
  Administered 2017-01-12 – 2017-01-14 (×3): 1 via ORAL
  Filled 2017-01-11 (×3): qty 1

## 2017-01-11 MED ORDER — BISACODYL 5 MG PO TBEC: 5.0000 mg | DELAYED_RELEASE_TABLET | Freq: Every day | ORAL | Status: DC | PRN

## 2017-01-11 MED ORDER — SODIUM CHLORIDE 0.9 % IV SOLN
INTRAVENOUS | Status: DC
  Administered 2017-01-11 – 2017-01-16 (×3): via INTRAVENOUS

## 2017-01-11 MED ORDER — MORPHINE SULFATE (PF) 2 MG/ML IV SOLN: 2.0000 mg | INTRAVENOUS | Status: DC | PRN

## 2017-01-11 MED ORDER — SODIUM CHLORIDE 0.9 % IV SOLN
Freq: Once | INTRAVENOUS | Status: AC
  Administered 2017-01-11: 19:00:00 via INTRAVENOUS

## 2017-01-11 MED ORDER — ONDANSETRON HCL 4 MG/2ML IJ SOLN
4.0000 mg | Freq: Four times a day (QID) | INTRAMUSCULAR | Status: DC | PRN
  Administered 2017-01-12 – 2017-01-15 (×4): 4 mg via INTRAVENOUS
  Filled 2017-01-11 (×4): qty 2

## 2017-01-11 MED ORDER — OXYCODONE HCL 5 MG PO TABS
5.0000 mg | ORAL_TABLET | ORAL | Status: DC | PRN
  Administered 2017-01-12: 5 mg via ORAL
  Filled 2017-01-11: qty 1

## 2017-01-11 MED ORDER — PANTOPRAZOLE SODIUM 40 MG PO TBEC
40.0000 mg | DELAYED_RELEASE_TABLET | Freq: Every day | ORAL | Status: DC
  Administered 2017-01-12 – 2017-01-14 (×3): 40 mg via ORAL
  Filled 2017-01-11 (×3): qty 1

## 2017-01-11 NOTE — ED Notes (Signed)
Assisted patient to use restroom. Patient unsteady when ambulating. Two person assist needed to move patient from bed. Sling placed on patient prior to ambulation as added support for left arm.

## 2017-01-11 NOTE — ED Provider Notes (Signed)
[facility] Emergency Department Provider Note        Time seen: ----------------------------------------- 3:34 PM on 01/11/2017 -----------------------------------------    I have reviewed the triage vital signs and the nursing notes.   HISTORY  Chief Complaint Arm Pain    HPI Nathan Atkinson is a 66 y.o. male who presents to the ER for left arm pain for the last 3 days. Patient states home health saw him today and instructed to call EMS. Patient states his left upper arm has been swollen and deformed looking. He has not had this happen before. He denies any recent falls or trauma. He does have a history of colorectal cancer. He denies fevers, chills or other complaints.   Past Medical History:  Diagnosis Date  . Cataract 08/2015   forming  . Colorectal cancer ([place]) 10/08/2016  . Diabetes mellitus without complication ([place])   . Hyperlipidemia   . Hypertension   . Platelets decreased [facility])     Patient Active Problem List   Diagnosis Date Noted  . Hip fracture ([place]) 12/01/2016  . Hypertension 11/20/2016  . Protein-calorie malnutrition, severe 10/25/2016  . Hyperbilirubinemia 10/24/2016  . 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 Obstructive jaundice 10/24/2016  . Rectal cancer metastasized to liver ([place])   . Port-a-cath in place   . Colorectal cancer ([place]) 10/08/2016  . Liver metastases ([place]) 10/08/2016  . Diabetes mellitus type 2, uncontrolled ([place]) 05/09/2016  . Diabetes mellitus type 2, controlled ([place]) 09/21/2015    Past Surgical History:  Procedure Laterality Date  . CARDIAC CATHETERIZATION  2003  . COLONOSCOPY N/A 10/04/2016   Procedure: COLONOSCOPY;  Surgeon: Lollie Sails, MD;  Location: [facility] ENDOSCOPY;  Service: Endoscopy;  Laterality: N/A;  . ERCP N/A 10/25/2016   Procedure: ENDOSCOPIC RETROGRADE CHOLANGIOPANCREATOGRAPHY (ERCP);  Surgeon: Lucilla Lame, MD;  Location: [facility] ENDOSCOPY;  Service: Endoscopy;  Laterality: N/A;   . ESOPHAGOGASTRODUODENOSCOPY (EGD) WITH PROPOFOL N/A 10/04/2016   Procedure: ESOPHAGOGASTRODUODENOSCOPY (EGD) WITH PROPOFOL;  Surgeon: Lollie Sails, MD;  Location: [facility] ENDOSCOPY;  Service: Endoscopy;  Laterality: N/A;  . INTRAMEDULLARY (IM) NAIL INTERTROCHANTERIC Left 12/01/2016   Procedure: INTRAMEDULLARY (IM) NAIL INTERTROCHANTRIC;  Surgeon: Hessie Knows, MD;  Location: [facility] ORS;  Service: Orthopedics;  Laterality: Left;  . PORTACATH PLACEMENT Left 10/12/2016   Procedure: INSERTION PORT-A-CATH;  Surgeon: Olean Ree, MD;  Location: [facility] ORS;  Service: General;  Laterality: Left;    Allergies Patient has no known allergies.  Social History Social History  Substance Use Topics  . Smoking status: Former Smoker    Years: 10.00    Types: Cigarettes    Quit date: 06/15/1995  . Smokeless tobacco: Never Used  . Alcohol use No    Review of Systems Constitutional: Negative for fever. Cardiovascular: Negative for chest pain. Respiratory: Negative for shortness of breath. Gastrointestinal: Negative for abdominal pain, vomiting and diarrhea. Musculoskeletal: Positive left arm pain and swelling Skin: Negative for rash. Neurological: Negative for headaches, positive for weakness  10-point ROS otherwise negative. ____________________________________________   PHYSICAL EXAM:  VITAL SIGNS: ED Triage Vitals  Enc Vitals Group     BP 01/11/17 1520 91/62     Pulse Rate 01/11/17 1520 (!) 109     Resp 01/11/17 1520 16     Temp 01/11/17 1520 98 F (36.7 C)     Temp Source 01/11/17 1520 Oral     SpO2 01/11/17 1520 98 %     Weight 01/11/17 1520 120 lb (54.4 kg)     Height  01/11/17 1520 5\' 2"  (1.575 m)     Head Circumference --      Peak Flow --      Pain Score 01/11/17 1521 8     Pain Loc --      Pain Edu? --      Excl. in [employer]? --    Constitutional: Alert and oriented. Well appearing and in no distress. Eyes: Scleral icterus is noted Normal extraocular movements. ENT   Head:  Normocephalic and atraumatic.   Nose: No congestion/rhinnorhea.   Mouth/Throat: Mucous membranes are moist.   Neck: No stridor. Cardiovascular: Normal rate, regular rhythm. No murmurs, rubs, or gallops. Respiratory: Normal respiratory effort without tachypnea nor retractions. Breath sounds are clear and equal bilaterally. No wheezes/rales/rhonchi. Gastrointestinal: Soft and nontender. Normal bowel sounds Musculoskeletal: Left upper extremity deformity around the mid humerus. Area is tender to touch. There is left upper extremity edema. Marked bilateral lower extremity pitting edema Neurologic:  Normal speech and language. No gross focal neurologic deficits are appreciated.  Skin:  Skin is warm, dry and intact. No rash noted. Psychiatric: Mood and affect are normal. Speech and behavior are normal.  ____________________________________________  ED COURSE:  Pertinent labs & imaging results that were available during my care of the patient were reviewed by me and considered in my medical decision making (see chart for details). Patient is in no distress, we will check left arm x-rays and possibly ultrasound.   Procedures ____________________________________________   LABS (pertinent positives/negatives)  Labs Reviewed  CBC WITH DIFFERENTIAL/PLATELET - Abnormal; Notable for the following:       Result Value   RBC 3.01 (*)    Hemoglobin 11.5 (*)    HCT 32.7 (*)    MCV 108.6 (*)    MCH 38.2 (*)    RDW 22.7 (*)    Platelets 62 (*)    Lymphs Abs 0.8 (*)    Monocytes Absolute 0.1 (*)    All other components within normal limits  COMPREHENSIVE METABOLIC PANEL - Abnormal; Notable for the following:    Sodium 121 (*)    Chloride 91 (*)    CO2 19 (*)    Glucose, Bld 196 (*)    BUN 47 (*)    Creatinine, Ser 1.37 (*)    Calcium 7.7 (*)    Total Protein 5.5 (*)    Albumin 1.8 (*)    AST 61 (*)    Alkaline Phosphatase 387 (*)    Total Bilirubin 3.3 (*)    GFR calc non Af Amer  53 (*)    All other components within normal limits  TROPONIN I    RADIOLOGY Images were viewed by me  Left humerus IMPRESSION: Moth-eaten appearance of the mid to distal left humeral shaft concerning for metastasis. Associated transverse pathologic fracture through the midportion of the lesion. ____________________________________________  FINAL ASSESSMENT AND PLAN  Pathologic humerus fracture, hyponatremia, metastatic cancer  Plan: Patient with labs and imaging as dictated above. Patient presents to the ER with atraumatic left arm pain. Family was concerned about taking him home because he uses a walker. He is now no longer able to do so. I have discussed with orthopedics who recommends shoulder immobilizer. He was found to be significantly hyponatremic. I will start him on saline. He has received Percocet for pain.   Earleen Newport, MD   Note: This note was generated in part or whole with voice recognition software. Voice recognition is usually quite accurate but there are transcription errors  that can and very often do occur. I apologize for any typographical errors that were not detected and corrected.     Earleen Newport, MD 01/11/17 [phone]

## 2017-01-11 NOTE — H&P (Addendum)
[place] [facility] [place] NAME: Nathan Atkinson    MR#:  [id number]  DATE OF BIRTH:  [birth year]  DATE OF ADMISSION:  01/11/2017  PRIMARY CARE PHYSICIAN: Baltazar Apo, MD   REQUESTING/REFERRING PHYSICIAN: Dr. Esperanza Heir  CHIEF COMPLAINT: Left shoulder pain    Chief Complaint  Patient presents with  . Arm Pain    HISTORY OF PRESENT ILLNESS:  Nathan Atkinson  is a 66 y.o. male with a known history of Metastatic colon cancer comes in with left shoulder pain for last 3 days and found to have left humerus fracture. Patient has pathological fracture of left humerus. Also found to have severe hyponatremia. Patient is getting chemotherapy from [facility]. According to patient's wife by mouth intake is little less and appetite is poor.  PAST MEDICAL HISTORY:   Past Medical History:  Diagnosis Date  . Cataract 08/2015   forming  . Colorectal cancer ([place]) 10/08/2016  . Diabetes mellitus without complication ([place])   . Hyperlipidemia   . Hypertension   . Platelets decreased ([place])     PAST SURGICAL HISTOIRY:   Past Surgical History:  Procedure Laterality Date  . CARDIAC CATHETERIZATION  2003  . COLONOSCOPY N/A 10/04/2016   Procedure: COLONOSCOPY;  Surgeon: Lollie Sails, MD;  Location: [facility] ENDOSCOPY;  Service: Endoscopy;  Laterality: N/A;  . ERCP N/A 10/25/2016   Procedure: ENDOSCOPIC RETROGRADE CHOLANGIOPANCREATOGRAPHY (ERCP);  Surgeon: Lucilla Lame, MD;  Location: [facility] ENDOSCOPY;  Service: Endoscopy;  Laterality: N/A;  . ESOPHAGOGASTRODUODENOSCOPY (EGD) WITH PROPOFOL N/A 10/04/2016   Procedure: ESOPHAGOGASTRODUODENOSCOPY (EGD) WITH PROPOFOL;  Surgeon: Lollie Sails, MD;  Location: [facility] ENDOSCOPY;  Service: Endoscopy;  Laterality: N/A;  . INTRAMEDULLARY (IM) NAIL INTERTROCHANTERIC Left 12/01/2016   Procedure: INTRAMEDULLARY (IM) NAIL INTERTROCHANTRIC;  Surgeon: Hessie Knows, MD;  Location: [facility] ORS;   Service: Orthopedics;  Laterality: Left;  . PORTACATH PLACEMENT Left 10/12/2016   Procedure: INSERTION PORT-A-CATH;  Surgeon: Olean Ree, MD;  Location: [facility] ORS;  Service: General;  Laterality: Left;    SOCIAL HISTORY:   Social History  Substance Use Topics  . Smoking status: Former Smoker    Years: 10.00    Types: Cigarettes    Quit date: 06/15/1995  . Smokeless tobacco: Never Used  . Alcohol use No    FAMILY HISTORY:   Family History  Problem Relation Age of Onset  . Cancer Brother     not sure what kind    DRUG ALLERGIES:  No Known Allergies  REVIEW OF SYSTEMS:  CONSTITUTIONAL: No fever does have fatigue. EYES: No blurred or double vision.  EARS, NOSE, AND THROAT: No tinnitus or ear pain.  RESPIRATORY: No cough, shortness of breath, wheezing or hemoptysis.  CARDIOVASCULAR: No chest pain, orthopnea, edema.  GASTROINTESTINAL: No nausea, vomiting, diarrhea or abdominal pain.  GENITOURINARY: No dysuria, hematuria.  ENDOCRINE: No polyuria, nocturia,  HEMATOLOGY: No anemia, easy bruising or bleeding SKIN: No rash or lesion. MUSCULOSKELETAL: Patient has left shoulder pain   NEUROLOGIC: No tingling, numbness, weakness.  PSYCHIATRY: No anxiety or depression.   MEDICATIONS AT HOME:   Prior to Admission medications   Medication Sig Start Date End Date Taking? Authorizing Provider  capecitabine (XELODA) 500 MG tablet Take 2 tab (1000 mg) in AM and 3 tab (1500 mg) in PM (12 hours apart) by mouth with water AFTER meal days 1-14. OFF days 15-21 10/30/16  Yes Historical Provider, MD  enalapril (VASOTEC) 10 MG tablet Take 10 mg by  mouth at bedtime.    Yes Historical Provider, MD  lidocaine-prilocaine (EMLA) cream Apply cream 1 hour before chemotherapy treatment 10/22/16  Yes Lequita Asal, MD  methocarbamol (ROBAXIN) 500 MG tablet Take 1 tablet (500 mg total) by mouth every 6 (six) hours as needed for muscle spasms. 12/04/16  Yes Fritzi Mandes, MD  omeprazole (PRILOSEC) 20 MG  capsule Take 1 capsule (20 mg total) by mouth daily. 10/11/16  Yes Olean Ree, MD  ondansetron (ZOFRAN) 8 MG tablet Take 1 tablet (8 mg) by mouth every 12 hours 30 minutes prior to each dose of oral chemotherapy to prevent nausea. 10/30/16  Yes Historical Provider, MD  oxyCODONE (OXY IR/ROXICODONE) 5 MG immediate release tablet Take 1-2 tablets (5-10 mg total) by mouth every 4 (four) hours as needed for moderate pain or severe pain (mild pain). Patient taking differently: Take 5 mg by mouth every 4 (four) hours as needed for moderate pain or severe pain (mild pain).  10/27/16  Yes Gladstone Lighter, MD  Polyethylene Glycol POWD Take 17 g by mouth daily. 10/11/16  Yes Olean Ree, MD  prochlorperazine (COMPAZINE) 10 MG tablet Take by mouth. 10/30/16  Yes Historical Provider, MD  enoxaparin (LOVENOX) 40 MG/0.4ML injection Inject 0.4 mLs (40 mg total) into the skin daily. Patient not taking: Reported on 01/11/2017 12/04/16   Fritzi Mandes, MD  feeding supplement, ENSURE ENLIVE, (ENSURE ENLIVE) LIQD Take 237 mLs by mouth 2 (two) times daily between meals. 10/27/16   Gladstone Lighter, MD      VITAL SIGNS:  Blood pressure 98/67, pulse (!) 106, temperature 98 F (36.7 C), temperature source Oral, resp. rate 16, height 5\' 2"  (1.575 m), weight 54.4 kg (120 lb), SpO2 98 %.  PHYSICAL EXAMINATION:  GENERAL:  66 y.o.-year-old patient lying in the bed with no acute distress.  EYES: Pupils equal, round, reactive to light and accommodation. No scleral icterus. Extraocular muscles intact.  HEENT: Head atraumatic, normocephalic. Oropharynx and nasopharynx clear.  NECK:  Supple, no jugular venous distention. No thyroid enlargement, no tenderness.  LUNGS: Normal breath sounds bilaterally, no wheezing, rales,rhonchi or crepitation. No use of accessory muscles of respiration.  CARDIOVASCULAR: S1, S2 normal. No murmurs, rubs, or gallops.  ABDOMEN: Soft, nontender, nondistended. Bowel sounds present. No organomegaly or  mass.  EXTREMITIES: No pedal edema, cyanosis, or clubbing.  NEUROLOGIC: Cranial nerves II through XII are intact. Muscle strength 5/5 in all extremities. Sensation intact. Gait not checked.  PSYCHIATRIC: The patient is alert and oriented x 3.  SKIN: No obvious rash, lesion, or ulcer.   LABORATORY PANEL:   CBC  Recent Labs Lab 01/11/17 1704  WBC 5.8  HGB 11.5*  HCT 32.7*  PLT 62*   ------------------------------------------------------------------------------------------------------------------  Chemistries   Recent Labs Lab 01/11/17 1704  NA 121*  K 4.1  CL 91*  CO2 19*  GLUCOSE 196*  BUN 47*  CREATININE 1.37*  CALCIUM 7.7*  AST 61*  ALT 27  ALKPHOS 387*  BILITOT 3.3*   ------------------------------------------------------------------------------------------------------------------  Cardiac Enzymes  Recent Labs Lab 01/11/17 1704  TROPONINI <0.03   ------------------------------------------------------------------------------------------------------------------  RADIOLOGY:  Dg Humerus Left  Result Date: 01/11/2017 CLINICAL DATA:  Left arm pain.  Fall. EXAM: LEFT HUMERUS - 2+ VIEW COMPARISON:  None. FINDINGS: There is a ill-defined area of bone destruction noted within the mid to distal left humeral shaft, along with a pathologic fracture through the midportion of this area. The areas concerning for metastasis. Soft tissues are intact. IMPRESSION: Moth-eaten appearance of the mid to  distal left humeral shaft concerning for metastasis. Associated transverse pathologic fracture through the midportion of the lesion. Electronically Signed   By: Rolm Baptise M.D.   On: 01/11/2017 16:08    EKG:   Orders placed or performed during the hospital encounter of 11/30/16  . ED EKG  . ED EKG  . EKG 12-Lead  . 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 Patient has a sling  On left  shoulder, and the orthopedic consulted. 2. Metastatic adenocarcinoma of the colon, chemotherapy as per [facility], patient is on Xeloda. Continue with #3 pain control with oxycodone Anorexia, weight loss: Level rises diet, continue nutritional supplements #4 recent left femur fracture status post ORIF in December. 5.  Hyponatremia ; secondary to SIADH: Received gentle hydration. Recheck sodium tomorrow. And does have chronic hyponatremia D/w patient,prefer to stay here instead  Of [facility] transfer.spoke with help of spanish translator/ All the records are reviewed and case discussed with ED provider. Management plans discussed with the patient, family and they are in agreement.  CODE STATUS ;full  TOTAL TIME TAKING CARE OF THIS PATIENT:55 minutes.    Epifanio Lesches M.D on 01/11/2017 at 8:18 PM  Between 7am to 6pm - Pager - [phone]  After 6pm go to [email or web address] - password [email or web address] [facility]  Office  [phone]  CC: Primary care physician; Baltazar Apo, MD  Note: This dictation was prepared with Dragon dictation along with smaller phrase technology. Any transcriptional errors that result from this process are unintentional.

## 2017-01-11 NOTE — ED Notes (Signed)
Assisted patient to use restroom. Brief changed and patient returned to bed. Will continue to monitor.

## 2017-01-11 NOTE — ED Triage Notes (Signed)
Patient from home via [facility]. Patient complaining of left arm pain x 3 days. States home health saw him today and instructed him to call EMS. Left arm is warm to touch and swelling noted to left hand. Radial pulses palpated.

## 2017-01-12 LAB — GLUCOSE, CAPILLARY: GLUCOSE-CAPILLARY: 148 mg/dL — AB (ref 65–99)

## 2017-01-12 LAB — CBC
HCT: 29.9 % — ABNORMAL LOW (ref 40.0–52.0)
HEMOGLOBIN: 10.6 g/dL — AB (ref 13.0–18.0)
MCH: 38.4 pg — ABNORMAL HIGH (ref 26.0–34.0)
MCHC: 35.4 g/dL (ref 32.0–36.0)
MCV: 108.5 fL — ABNORMAL HIGH (ref 80.0–100.0)
PLATELETS: 53 10*3/uL — AB (ref 150–440)
RBC: 2.76 MIL/uL — AB (ref 4.40–5.90)
RDW: 22.4 % — ABNORMAL HIGH (ref 11.5–14.5)
WBC: 5.6 10*3/uL (ref 3.8–10.6)

## 2017-01-12 LAB — BASIC METABOLIC PANEL
ANION GAP: 9 (ref 5–15)
BUN: 46 mg/dL — ABNORMAL HIGH (ref 6–20)
CALCIUM: 7.4 mg/dL — AB (ref 8.9–10.3)
CHLORIDE: 97 mmol/L — AB (ref 101–111)
CO2: 20 mmol/L — ABNORMAL LOW (ref 22–32)
CREATININE: 1.14 mg/dL (ref 0.61–1.24)
GFR calc non Af Amer: >60 mL/min (ref >60)
Glucose, Bld: 156 mg/dL — ABNORMAL HIGH (ref 65–99)
Potassium: 3.6 mmol/L (ref 3.5–5.1)
SODIUM: 126 mmol/L — AB (ref 135–145)

## 2017-01-12 MED ORDER — CAPECITABINE 500 MG PO TABS
1500.0000 mg | ORAL_TABLET | Freq: Every day | ORAL | Status: DC
  Filled 2017-01-12: qty 3

## 2017-01-12 MED ORDER — CAPECITABINE 500 MG PO TABS
1000.0000 mg | ORAL_TABLET | Freq: Every day | ORAL | Status: AC
  Administered 2017-01-12 – 2017-01-14 (×3): 1000 mg via ORAL
  Filled 2017-01-12 (×4): qty 2

## 2017-01-12 NOTE — Progress Notes (Signed)
PT Cancellation Note  Patient Details Name: Nathan Atkinson MRN: [id number] DOB: [birth year]   Cancelled Treatment:    Reason Eval/Treat Not Completed: Other (comment).  PT made several attempts to see pt and he was needing to be cleaned x 2 then refused the visit.  Pt has PLOF with WC mobility after Nov 2017 L femoral fracture, and recently was getting physical therapy at home.  Now he has a fall from weakness from poor intake after chemo recently for his colon CA.  Has indications that these fractures are mets.  Will be expected to go home with family when finished here, but evaluation was not completed to indicate the level of assistance he needs since this new injury.  Will attempt to see in the AM.   Ramond Dial 01/12/2017, 12:07 PM   Mee Hives, PT MS Acute Rehab Dept. Number: [place] and [place]

## 2017-01-12 NOTE — Progress Notes (Addendum)
[place] at [place] NAME: Nathan Atkinson    MR#:  [id number]  DATE OF BIRTH:  08-12-1951  SUBJECTIVE: Admitted because of shoulder pain and found to have left shoulder fracture. Patient has no history of trauma, found to have pathological fracture of the left humerus. He also has hyponatremia.  Patient says that his pain is controlled, denies any other complaints. Spoke with patient's son also.   CHIEF COMPLAINT:   Chief Complaint  Patient presents with  . Arm Pain    REVIEW OF SYSTEMS:   ROS CONSTITUTIONAL: No fever, fatigue or weakness.  EYES: No blurred or double vision.  EARS, NOSE, AND THROAT: No tinnitus or ear pain.  RESPIRATORY: No cough, shortness of breath, wheezing or hemoptysis.  CARDIOVASCULAR: No chest pain, orthopnea, edema.  GASTROINTESTINAL: No nausea, vomiting, diarrhea or abdominal pain.  GENITOURINARY: No dysuria, hematuria.  ENDOCRINE: No polyuria, nocturia,  HEMATOLOGY: No anemia, easy bruising or bleeding SKIN: No rash or lesion. MUSCULOSKELETAL:  Left Shoulder pain.   NEUROLOGIC: No tingling, numbness, weakness.  PSYCHIATRY: No anxiety or depression.   DRUG ALLERGIES:  No Known Allergies  VITALS:  Blood pressure (!) 95/56, pulse 94, temperature 97.4 F (36.3 C), temperature source Axillary, resp. rate 16, height 5\' 2"  (1.575 m), weight 54.4 kg (120 lb), SpO2 99 %.  PHYSICAL EXAMINATION:  GENERAL:  65 y.o.-year-old patient lying in the bed with no acute distress.  EYES: Pupils equal, round, reactive to light and accommodation. No scleral icterus. Extraocular muscles intact.  HEENT: Head atraumatic, normocephalic. Oropharynx and nasopharynx clear.  NECK:  Supple, no jugular venous distention. No thyroid enlargement, no tenderness.  LUNGS: Normal breath sounds bilaterally, no wheezing, rales,rhonchi or crepitation. No use of accessory muscles of respiration.  CARDIOVASCULAR: S1, S2 normal.  No murmurs, rubs, or gallops.  ABDOMEN: Soft, nontender, nondistended. Bowel sounds present. No organomegaly or mass.  EXTREMITIES: No pedal edema, cyanosis, or clubbing. Left shoulder in sling. NEUROLOGIC: Cranial nerves II through XII are intact. Muscle strength 5/5 in all extremities. Sensation intact. Gait not checked.  PSYCHIATRIC: The patient is alert and oriented x 3.  SKIN: No obvious rash, lesion, or ulcer.    LABORATORY PANEL:   CBC  Recent Labs Lab 01/12/17 0455  WBC 5.6  HGB 10.6*  HCT 29.9*  PLT 53*   ------------------------------------------------------------------------------------------------------------------  Chemistries   Recent Labs Lab 01/11/17 1704 01/12/17 0455  NA 121* 126*  K 4.1 3.6  CL 91* 97*  CO2 19* 20*  GLUCOSE 196* 156*  BUN 47* 46*  CREATININE 1.37* 1.14  CALCIUM 7.7* 7.4*  AST 61*  --   ALT 27  --   ALKPHOS 387*  --   BILITOT 3.3*  --    ------------------------------------------------------------------------------------------------------------------  Cardiac Enzymes  Recent Labs Lab 01/11/17 1704  TROPONINI <0.03   ------------------------------------------------------------------------------------------------------------------  RADIOLOGY:  Dg Humerus Left  Result Date: 01/11/2017 CLINICAL DATA:  Left arm pain.  Fall. EXAM: LEFT HUMERUS - 2+ VIEW COMPARISON:  None. FINDINGS: There is a ill-defined area of bone destruction noted within the mid to distal left humeral shaft, along with a pathologic fracture through the midportion of this area. The areas concerning for metastasis. Soft tissues are intact. IMPRESSION: Moth-eaten appearance of the mid to distal left humeral shaft concerning for metastasis. Associated transverse pathologic fracture through the midportion of the lesion. Electronically Signed   By: Rolm Baptise M.D.   On: 01/11/2017 16:08    EKG:  Orders placed or performed during the hospital encounter of  11/30/16  . ED EKG  . ED EKG  . EKG 12-Lead  . EKG 12-Lead    ASSESSMENT AND PLAN:   Number: Pathological fracture of left humerus: Patient has sling for left humerus. Orthopedic consult appreciated #2 recent left femur fracture status post repair, continue physical therapy  #3 .hyponatremia secondary to dehydration: Improving with IV hydration; sodium improved from 121-126.  #4 .metastatic adenocarcinoma of the colon: Patient is on Xeloda, follows up with [facility]: Resume the Xeloda. Discussed with the patient's son #5. cancer pain: Continue oxycodone to help with the humerus fracture also. #6 l.ikely discharge tomorrow home, follow-up with [facility] D/w son Prognosis poor    All the records are reviewed and case discussed with Care Management/Social Workerr. Management plans discussed with the patient, family and they are in agreement.  CODE STATUS: full TOTAL TIME TAKING CARE OF THIS PATIENT: 48minutes.   POSSIBLE D/C IN 1-2 DAYS, DEPENDING ON CLINICAL CONDITION.   Epifanio Lesches M.D on 01/12/2017 at 8:55 AM  Between 7am to 6pm - Pager - [phone]  After 6pm go to [email or web address] - password [email or web address] [facility] [facility]  Office  [phone]  CC: Primary care physician; Baltazar Apo, MD   Note: This dictation was prepared with Dragon dictation along with smaller phrase technology. Any transcriptional errors that result from this process are unintentional.

## 2017-01-13 DIAGNOSIS — Z79899 Other long term (current) drug therapy: Secondary | ICD-10-CM

## 2017-01-13 DIAGNOSIS — I10 Essential (primary) hypertension: Secondary | ICD-10-CM

## 2017-01-13 DIAGNOSIS — S42402S Unspecified fracture of lower end of left humerus, sequela: Secondary | ICD-10-CM

## 2017-01-13 DIAGNOSIS — R197 Diarrhea, unspecified: Secondary | ICD-10-CM

## 2017-01-13 DIAGNOSIS — D696 Thrombocytopenia, unspecified: Secondary | ICD-10-CM

## 2017-01-13 DIAGNOSIS — Z9221 Personal history of antineoplastic chemotherapy: Secondary | ICD-10-CM

## 2017-01-13 DIAGNOSIS — R630 Anorexia: Secondary | ICD-10-CM

## 2017-01-13 DIAGNOSIS — C787 Secondary malignant neoplasm of liver and intrahepatic bile duct: Secondary | ICD-10-CM

## 2017-01-13 DIAGNOSIS — E785 Hyperlipidemia, unspecified: Secondary | ICD-10-CM

## 2017-01-13 DIAGNOSIS — Z87891 Personal history of nicotine dependence: Secondary | ICD-10-CM

## 2017-01-13 DIAGNOSIS — D649 Anemia, unspecified: Secondary | ICD-10-CM

## 2017-01-13 DIAGNOSIS — E871 Hypo-osmolality and hyponatremia: Secondary | ICD-10-CM

## 2017-01-13 DIAGNOSIS — Z809 Family history of malignant neoplasm, unspecified: Secondary | ICD-10-CM

## 2017-01-13 DIAGNOSIS — E119 Type 2 diabetes mellitus without complications: Secondary | ICD-10-CM

## 2017-01-13 DIAGNOSIS — C20 Malignant neoplasm of rectum: Secondary | ICD-10-CM

## 2017-01-13 LAB — GASTROINTESTINAL PANEL BY PCR, STOOL (REPLACES STOOL CULTURE)

## 2017-01-13 LAB — BASIC METABOLIC PANEL
ANION GAP: 8 (ref 5–15)
BUN: 31 mg/dL — ABNORMAL HIGH (ref 6–20)
CO2: 19 mmol/L — ABNORMAL LOW (ref 22–32)
Calcium: 7.5 mg/dL — ABNORMAL LOW (ref 8.9–10.3)
Chloride: 100 mmol/L — ABNORMAL LOW (ref 101–111)
Creatinine, Ser: 0.61 mg/dL (ref 0.61–1.24)
GFR calc Af Amer: >60 mL/min (ref >60)
Glucose, Bld: 152 mg/dL — ABNORMAL HIGH (ref 65–99)
POTASSIUM: 3.3 mmol/L — AB (ref 3.5–5.1)
SODIUM: 127 mmol/L — AB (ref 135–145)

## 2017-01-13 LAB — C DIFFICILE QUICK SCREEN W PCR REFLEX
C DIFFICILE (CDIFF) INTERP: NOT DETECTED
C DIFFICILE (CDIFF) TOXIN: NEGATIVE
C DIFFICLE (CDIFF) ANTIGEN: NEGATIVE

## 2017-01-13 LAB — GLUCOSE, CAPILLARY: GLUCOSE-CAPILLARY: 151 mg/dL — AB (ref 65–99)

## 2017-01-13 MED ORDER — SODIUM CHLORIDE 0.9 % IV SOLN
30.0000 meq | Freq: Once | INTRAVENOUS | Status: AC
  Administered 2017-01-13: 30 meq via INTRAVENOUS
  Filled 2017-01-13 (×2): qty 15

## 2017-01-13 NOTE — Care Management Note (Addendum)
Case Management Note  Patient Details  Name: Nathan Atkinson MRN: [id number] Date of Birth: 1951/04/24  Subjective/Objective:    Mr [name] had a planned discharge date of 01/13/17 but current barriers to discharge include C-diff diarrhea, shoulder pain per left humerus fracture, colon cancer pain, NA=127. Followed outpatient by [facility]. Case management will follow for discharge planning.                Action/Plan:   Expected Discharge Date:                  Expected Discharge Plan:     In-House Referral:     Discharge planning Services     Post Acute Care Choice:    Choice offered to:     DME Arranged:    DME Agency:     HH Arranged:    HH Agency:     Status of Service:     If discussed at [employer] of Stay Meetings, dates discussed:    Additional Comments:  Liany Mumpower A, RN 01/13/2017, 1:56 PM

## 2017-01-13 NOTE — Progress Notes (Signed)
[facility] NOTE  Patient Care Team: Denton Lank, MD as PCP - General (Family Medicine) Pollyann Glen, RN as [facility]  CHIEF COMPLAINTS/PURPOSE OF CONSULTATION:  Metastatic rectal cancer/ humerus fracture  HISTORY OF PRESENTING ILLNESS: Please note patient is drowsy/speaks limited English. Hence most of the history is taken talking to the patient's son by the bedside.   Nathan Atkinson 65 y.o.  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  As per the son patient noted to have worsening pain in left upper extremity over the last 1 week or so. It progressively got worse. Patient has resting most of the time; he goes around in the house using a walker.  No nausea no vomiting. Poor appetite. Multiple episodes of loose stools the last few days.m[ patient is currently finishing the last week of Xeloda- 3 more days to go; in a 14 day-on and 1 week off cycle]. As per the son patient is awaiting a restaging CT scan- in approximately 10 days from now at [facility].  X-ray of the left humerus shows distal moth-eaten appearance; and fracture. He is currently in sling.   ROS: A complete 10 point review of system is done which is negative except mentioned above in history of present illness  MEDICAL HISTORY:  Past Medical History:  Diagnosis Date  . Cataract 08/2015   forming  . Colorectal cancer ([place]) 10/08/2016  . Diabetes mellitus without complication ([place])   . Hyperlipidemia   . Hypertension   . Platelets decreased ([place])     SURGICAL HISTORY: Past Surgical History:  Procedure Laterality Date  . CARDIAC CATHETERIZATION  2003  . COLONOSCOPY N/A 10/04/2016   Procedure: COLONOSCOPY;  Surgeon: Lollie Sails, MD;  Location:  [facility] ENDOSCOPY;  Service: Endoscopy;  Laterality: N/A;  . ERCP N/A 10/25/2016   Procedure: ENDOSCOPIC RETROGRADE CHOLANGIOPANCREATOGRAPHY (ERCP);  Surgeon: Lucilla Lame, MD;  Location: [facility] ENDOSCOPY;  Service: Endoscopy;  Laterality: N/A;  . ESOPHAGOGASTRODUODENOSCOPY (EGD) WITH PROPOFOL N/A 10/04/2016   Procedure: ESOPHAGOGASTRODUODENOSCOPY (EGD) WITH PROPOFOL;  Surgeon: Lollie Sails, MD;  Location: [facility] ENDOSCOPY;  Service: Endoscopy;  Laterality: N/A;  . INTRAMEDULLARY (IM) NAIL INTERTROCHANTERIC Left 12/01/2016   Procedure: INTRAMEDULLARY (IM) NAIL INTERTROCHANTRIC;  Surgeon: Hessie Knows, MD;  Location: [facility] ORS;  Service: Orthopedics;  Laterality: Left;  . PORTACATH PLACEMENT Left 10/12/2016   Procedure: INSERTION PORT-A-CATH;  Surgeon: Olean Ree, MD;  Location: [facility] ORS;  Service: General;  Laterality: Left;    SOCIAL HISTORY: Social History   Social History  . Marital status: Married    Spouse name: N/A  . Number of children: N/A  . Years of education: N/A   Occupational History  . Not on file.   Social History Main Topics  . Smoking status: Former Smoker    Years: 10.00    Types: Cigarettes    Quit date: 06/15/1995  . Smokeless tobacco: Never Used  . Alcohol use No  . Drug use: No  . Sexual activity: Not on file   Other Topics Concern  . Not on file   Social History Narrative  . No narrative on file    FAMILY HISTORY: Family History  Problem Relation Age of Onset  . Cancer Brother     not sure what kind    ALLERGIES:  has No Known  Allergies.  MEDICATIONS:  Current Facility-Administered Medications  Medication Dose Route Frequency Provider Last Rate Last Dose  . 0.9 %  sodium chloride infusion   Intravenous Continuous Epifanio Lesches, MD 50 mL/hr at 01/11/17 2253    . acetaminophen (TYLENOL) tablet 650 mg  650 mg Oral Q6H PRN Epifanio Lesches, MD       Or  . acetaminophen (TYLENOL) suppository 650 mg  650 mg Rectal Q6H PRN Epifanio Lesches, MD      . capecitabine (XELODA) tablet 1,000 mg  1,000 mg Oral QPC breakfast Epifanio Lesches, MD   1,000 mg at 01/13/17 1101  . capecitabine (XELODA) tablet 1,500 mg  1,500 mg Oral QPC supper Epifanio Lesches, MD      . enoxaparin (LOVENOX) injection 40 mg  40 mg Subcutaneous Q24H Epifanio Lesches, MD   40 mg at 01/11/17 2253  . feeding supplement (ENSURE ENLIVE) (ENSURE ENLIVE) liquid 237 mL  237 mL Oral BID BM Epifanio Lesches, MD      . HYDROcodone-acetaminophen (NORCO/VICODIN) 5-325 MG per tablet 1-2 tablet  1-2 tablet Oral Q4H PRN Epifanio Lesches, MD   1 tablet at 01/13/17 0810  . methocarbamol (ROBAXIN) tablet 500 mg  500 mg Oral Q6H PRN Epifanio Lesches, MD      . morphine 2 MG/ML injection 2 mg  2 mg Intravenous Q4H PRN Epifanio Lesches, MD      . ondansetron (ZOFRAN) tablet 4 mg  4 mg Oral Q6H PRN Epifanio Lesches, MD       Or  . ondansetron (ZOFRAN) injection 4 mg  4 mg Intravenous Q6H PRN Epifanio Lesches, MD   4 mg at 01/12/17 1810  . pantoprazole (PROTONIX) EC tablet 40 mg  40 mg Oral Daily Epifanio Lesches, MD   40 mg at 01/13/17 [id number]   Facility-Administered Medications Ordered in Other Encounters  Medication Dose Route Frequency Provider Last Rate Last Dose  . heparin lock flush 100 unit/mL  500 Units Intravenous Once Lequita Asal, MD      . sodium chloride 0.9 % injection 10 mL  10 mL Intravenous Once Lequita Asal, MD          .  PHYSICAL EXAMINATION:  Vitals:   01/13/17 0355 01/13/17 0751  BP: 109/63 103/63  Pulse: (!) 107 (!) 109  Resp: 18 16  Temp: 98.3 F (36.8 C) 98.2 F (36.8 C)   Filed Weights   01/11/17 1520  Weight: 120 lb (54.4 kg)    GENERAL: Thin built moderately nourished male patient resting in the bed comfortably. Sleepy no distress and comfortable.  Accompanied by the son. Positive for temporal wasting EYES: no pallor or icterus OROPHARYNX: no thrush or ulceration. Dry oral mucosa NECK:  supple, no masses felt LYMPH:  no palpable lymphadenopathy in the cervical, axillary or inguinal regions LUNGS: decreased breath sounds to auscultation at bases and  No wheeze or crackles HEART/CVS: regular rate & rhythm and no murmurs; No lower extremity edema ABDOMEN: abdomen soft, non-tender and normal bowel sounds Musculoskeletal:no cyanosis of digits and no clubbing  PSYCH: sleepy NEURO: no focal motor/sensory deficits SKIN:  no rashes or significant lesions  LABORATORY DATA:  I have reviewed the data as listed Lab Results  Component Value Date   WBC 5.6 01/12/2017   HGB 10.6 (L) 01/12/2017   HCT 29.9 (L) 01/12/2017   MCV 108.5 (H) 01/12/2017   PLT 53 (L) 01/12/2017    Recent Labs  10/29/16 [id number] 12/01/16 0029  01/11/17 1704 01/12/17 0455  01/13/17 0550  NA 124* 128*  < > 121* 126* 127*  K 3.3* 3.4*  < > 4.1 3.6 3.3*  CL 93* 96*  < > 91* 97* 100*  CO2 25 24  < > 19* 20* 19*  GLUCOSE 161* 219*  < > 196* 156* 152*  BUN 13 14  < > 47* 46* 31*  CREATININE 0.34* 0.45*  < > 1.37* 1.14 0.61  CALCIUM 8.1* 8.0*  < > 7.7* 7.4* 7.5*  GFRNONAA >60 >60  < > 53* >60 >60  GFRAA >60 >60  < > >60 >60 >60  PROT 7.3 6.6  --  5.5*  --   --   ALBUMIN 2.2* 1.8*  --  1.8*  --   --   AST 123* 97*  --  61*  --   --   ALT 158* 62  --  27  --   --   ALKPHOS 1,078* 607*  --  387*  --   --   BILITOT 14.1* 8.6*  --  3.3*  --   --   < > = values in this interval not displayed.  RADIOGRAPHIC STUDIES: I have personally reviewed the radiological images as listed and agreed with the findings in the report. Dg Humerus Left  Result Date: 01/11/2017 CLINICAL DATA:  Left arm pain.  Fall. EXAM: LEFT HUMERUS - 2+ VIEW COMPARISON:  None. FINDINGS: There is a ill-defined area of bone destruction noted within the mid to distal left humeral shaft, along with a pathologic fracture through the midportion of this area. The areas concerning for metastasis. Soft tissues are intact. IMPRESSION: Moth-eaten  appearance of the mid to distal left humeral shaft concerning for metastasis. Associated transverse pathologic fracture through the midportion of the lesion. Electronically Signed   By: Rolm Baptise M.D.   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  # Left upper extremity pain x-rays suggestive of-Humerus fracture likely pathologic. Currently in a sling. This needs to be treated  open reduction with internal fixation. Given the patient's multiple comorbidities /hyponatremia /low platelet count in the 50s; and given the non-urgent situation- orthopedics recommends improvement of his clinical status prior to surgery. Surgery is also to be reviewed in the context of his metastatic disease.  Discussed with Dr. Sabra Heck.  # Metastatic rectal cancer to the liver - currently on Xeloda oxaliplatin-since mid December 2017. Hold Xeloda while in the hospital. Therefore to [facility] for restaging CAT scans to be done in mid-February 2018.   # Anemia thrombocytopenia- likely from ongoing chemotherapy. Likely improve in the next one to 2 weeks.  # Diarrhea- likely from xeloda; await C. difficile workup. GI panel PCR.  # Hyponatremia [dehydration/diarrhea] - recommend continued IV fluids   #The above plan of care was discussed with the patient's son by the bedside. In detail. Also discussed with Dr. Sabra Heck. We'll also contact [facility] with an update on pt's clinical condition.  Thank you Dr. Vianne Bulls for allowing me to participate in the care of your pleasant patient. Please do not hesitate to contact me with questions or concerns in the interim.     Cammie Sickle, MD 01/13/2017 11:47 AM

## 2017-01-13 NOTE — Progress Notes (Signed)
Initial Nutrition Assessment  DOCUMENTATION CODES:   Severe malnutrition in context of chronic illness  INTERVENTION:  1. Continue Ensure Enlive po BID, each supplement provides 350 kcal and 20 grams of protein  NUTRITION DIAGNOSIS:   Malnutrition related to chronic illness (Cancer) as evidenced by severe depletion of muscle mass, severe depletion of body fat, percent weight loss.  GOAL:   Patient will meet greater than or equal to 90% of their needs  MONITOR:   PO intake, Labs, I & O's, Weight trends, Supplement acceptance  REASON FOR ASSESSMENT:   Malnutrition Screening Tool    ASSESSMENT:   Nathan Atkinson  is a 66 y.o. male with a known history of Metastatic colon cancer comes in with left shoulder pain for last 3 days and found to have left humerus fracture. Patient has pathological fracture of left humerus. Also found to have severe hyponatremia  Spanish speaking patient, but family at bedside. Reports patient eating a little less than normal PTA, appetite poor but forces himself to eat.  However weight is down a severe 15#/11.1% over 1.25 months. Has not eaten much while he is here. No documented PO intake thus far. On Chemo from [place]. Nutrition-Focused physical exam completed. Findings are severe fat depletion, severe muscle depletion, and no edema.  Labs and medications reviewed: Na 127, K 3.3 NS @ 73mL/hr  Diet Order:  Diet regular Room service appropriate? Yes; Fluid consistency: Thin  Skin:  Wound (see comment) (PU to coccyx)  Last BM:  01/13/2017  Height:   Ht Readings from Last 1 Encounters:  01/11/17 5\' 2"  (1.575 m)    Weight:   Wt Readings from Last 1 Encounters:  01/11/17 120 lb (54.4 kg)    Ideal Body Weight:  50 kg  BMI:  Body mass index is 21.95 kg/m.  Estimated Nutritional Needs:   Kcal:  [id number] calories (30-35 cal/kg)  Protein:  71-93 gm  Fluid:  >/= 1.6L  EDUCATION NEEDS:   No education needs identified at this  time  Nathan Atkinson. Kirin Pastorino, MS, RD LDN Inpatient Clinical Dietitian Pager [phone]

## 2017-01-13 NOTE — Progress Notes (Addendum)
[facility] [place] NAME: Nathan Atkinson    MR#:  [id number]  DATE OF BIRTH:  10/17/1951  SUBJECTIVE: Admitted because of shoulder pain and found to have left shoulder fracture. Patient has no history of trauma, found to have pathological fracture of the left humerus. He also has hyponatremia.  And has the diarrhea every 20 minutes. Son is requesting  Rectal tube.. Has a very poor by mouth intake.   CHIEF COMPLAINT:   Chief Complaint  Patient presents with  . Arm Pain    REVIEW OF SYSTEMS:   ROS CONSTITUTIONAL: No fever,  Has fatigue or weakness.  EYES: No blurred or double vision.  EARS, NOSE, AND THROAT: No tinnitus or ear pain.  RESPIRATORY: No cough, shortness of breath, wheezing or hemoptysis.  CARDIOVASCULAR: No chest pain, orthopnea, edema.  GASTROINTESTINAL: No nausea, vomiting.has diarrhoea.  GENITOURINARY: No dysuria, hematuria.  ENDOCRINE: No polyuria, nocturia,  HEMATOLOGY: No anemia, easy bruising or bleeding SKIN: No rash or lesion. MUSCULOSKELETAL:  Left Shoulder pain.   NEUROLOGIC: No tingling, numbness, weakness.  PSYCHIATRY: No anxiety or depression.   DRUG ALLERGIES:  No Known Allergies  VITALS:  Blood pressure 103/63, pulse (!) 109, temperature 98.2 F (36.8 C), temperature source Oral, resp. rate 16, height 5\' 2"  (1.575 m), weight 54.4 kg (120 lb), SpO2 100 %.  PHYSICAL EXAMINATION:  GENERAL:  66 y.o.-year-old patient lying in the bed with no acute distress.  EYES: Pupils equal, round, reactive to light and accommodation. No scleral icterus. Extraocular muscles intact.  HEENT: Head atraumatic, normocephalic. Oropharynx and nasopharynx clear.  NECK:  Supple, no jugular venous distention. No thyroid enlargement, no tenderness.  LUNGS: Normal breath sounds bilaterally, no wheezing, rales,rhonchi or crepitation. No use of accessory muscles of respiration.  CARDIOVASCULAR: S1, S2 normal. No  murmurs, rubs, or gallops.  ABDOMEN: Soft, nontender, nondistended. Bowel sounds present. No organomegaly or mass.  EXTREMITIES: No pedal edema, cyanosis, or clubbing. Left shoulder in sling. NEUROLOGIC: Cranial nerves II through XII are intact. Muscle strength 5/5 in all extremities. Sensation intact. Gait not checked.  PSYCHIATRIC: The patient is alert and oriented x 3.  SKIN: Nsin rash in buttock due to diarrhoea  LABORATORY PANEL:   CBC  Recent Labs Lab 01/12/17 0455  WBC 5.6  HGB 10.6*  HCT 29.9*  PLT 53*   ------------------------------------------------------------------------------------------------------------------  Chemistries   Recent Labs Lab 01/11/17 1704  01/13/17 0550  NA 121*  < > 127*  K 4.1  < > 3.3*  CL 91*  < > 100*  CO2 19*  < > 19*  GLUCOSE 196*  < > 152*  BUN 47*  < > 31*  CREATININE 1.37*  < > 0.61  CALCIUM 7.7*  < > 7.5*  AST 61*  --   --   ALT 27  --   --   ALKPHOS 387*  --   --   BILITOT 3.3*  --   --   < > = values in this interval not displayed. ------------------------------------------------------------------------------------------------------------------  Cardiac Enzymes  Recent Labs Lab 01/11/17 1704  TROPONINI <0.03   ------------------------------------------------------------------------------------------------------------------  RADIOLOGY:  Dg Humerus Left  Result Date: 01/11/2017 CLINICAL DATA:  Left arm pain.  Fall. EXAM: LEFT HUMERUS - 2+ VIEW COMPARISON:  None. FINDINGS: There is a ill-defined area of bone destruction noted within the mid to distal left humeral shaft, along with a pathologic fracture through the midportion of this area. The areas concerning for  metastasis. Soft tissues are intact. IMPRESSION: Moth-eaten appearance of the mid to distal left humeral shaft concerning for metastasis. Associated transverse pathologic fracture through the midportion of the lesion. Electronically Signed   By: Rolm Baptise M.D.    On: 01/11/2017 16:08    EKG:   Orders placed or performed during the hospital encounter of 11/30/16  . ED EKG  . ED EKG  . EKG 12-Lead  . EKG 12-Lead    ASSESSMENT AND PLAN:   Number: Pathological fracture of left humerus: Patient has sling for left humerus. Orthopedic consult appreciated  #2 recent left femur fracture status post repair, continue physical therapy  #3 .hyponatremia secondary to dehydration: Improving with IV hydration; sodium improved from 121-126.   #4 .metastatic adenocarcinoma of the colon: Patient is on Xeloda, follows up with [facility]: Seen by oncology, hold chemotherapy while in hospital.   #5. cancer pain: Continue oxycodone to help with the humerus fracture also. #6 acute severe diarrhea: Likely gastroenteritis versus chemotherapy-induced: Check stool for GI PCR, if negative will give Imodium. Added rectal tube.  7,severe Malnutrition patient to does not want to ensure and does not want to eat anything.d/w son D/w son Prognosis poor; will get palliative care  Consult am,.    All the records are reviewed and case discussed with Care Management/Social Workerr. Management plans discussed with the patient, family and they are in agreement.  CODE STATUS: full TOTAL TIME TAKING CARE OF THIS PATIENT: 59minutes.   POSSIBLE D/C IN 1-2 DAYS, DEPENDING ON CLINICAL CONDITION.   Nathan Atkinson M.D on 01/13/2017 at 12:27 PM  Between 7am to 6pm - Pager - [phone]  After 6pm go to [email or web address] - password [email or web address] [facility]  Office  [phone]  CC: Primary care physician; Nathan Apo, MD   Note: This dictation was prepared with Dragon dictation along with smaller phrase technology. Any transcriptional errors that result from this process are unintentional.

## 2017-01-13 NOTE — Consult Note (Signed)
ORTHOPAEDIC CONSULTATION  REQUESTING PHYSICIAN: Epifanio Lesches, MD  Chief Complaint: Left arm pain  HPI: Nathan Atkinson is a 66 y.o. male who complains of  left arm pain for the last few days.  The patient suffered a spontaneous fracture of the left humerus.  He is being treated at [facility] for metastatic colon cancer.  He has had several rounds of chemotherapy.  Currently, his blood work is abnormal with a red blood count of 2.76 and platelets of 53,000.  His sodium is low at 127, and his potassium is slightly low.  I examined the patient and discussed treatment options with the patient's son and wife.  Currently, he is not a good surgical candidate and I feel that the fracture should be splinted.  I recommend that they speak to their oncologist at [facility] tomorrow and get his recommendation.  He has been receiving his care at [facility] and they may wish to have the orthopedic service treat him.  Whether or not to proceed with surgery would depend on his medical condition and potential longevity.  Past Medical History:  Diagnosis Date  . Cataract 08/2015   forming  . Colorectal cancer ([place]) 10/08/2016  . Diabetes mellitus without complication ([place])   . Hyperlipidemia   . Hypertension   . Platelets decreased ([place])    Past Surgical History:  Procedure Laterality Date  . CARDIAC CATHETERIZATION  2003  . COLONOSCOPY N/A 10/04/2016   Procedure: COLONOSCOPY;  Surgeon: Lollie Sails, MD;  Location: [facility] ENDOSCOPY;  Service: Endoscopy;  Laterality: N/A;  . ERCP N/A 10/25/2016   Procedure: ENDOSCOPIC RETROGRADE CHOLANGIOPANCREATOGRAPHY (ERCP);  Surgeon: Lucilla Lame, MD;  Location[facility] ENDOSCOPY;  Service: Endoscopy;  Laterality: N/A;  . ESOPHAGOGASTRODUODENOSCOPY (EGD) WITH PROPOFOL N/A 10/04/2016   Procedure: ESOPHAGOGASTRODUODENOSCOPY (EGD) WITH PROPOFOL;  Surgeon: Lollie Sails, MD;  Location: [facility] ENDOSCOPY;  Service: Endoscopy;  Laterality: N/A;  . INTRAMEDULLARY  (IM) NAIL INTERTROCHANTERIC Left 12/01/2016   Procedure: INTRAMEDULLARY (IM) NAIL INTERTROCHANTRIC;  Surgeon: Hessie Knows, MD;  Location: [facility] ORS;  Service: Orthopedics;  Laterality: Left;  . PORTACATH PLACEMENT Left 10/12/2016   Procedure: INSERTION PORT-A-CATH;  Surgeon: Olean Ree, MD;  Location: [facility] ORS;  Service: General;  Laterality: Left;   Social History   Social History  . Marital status: Married    Spouse name: N/A  . Number of children: N/A  . Years of education: N/A   Social History Main Topics  . Smoking status: Former Smoker    Years: 10.00    Types: Cigarettes    Quit date: 06/15/1995  . Smokeless tobacco: Never Used  . Alcohol use No  . Drug use: No  . Sexual activity: Not Asked   Other Topics Concern  . None   Social History Narrative  . None   Family History  Problem Relation Age of Onset  . Cancer Brother     not sure what kind   No Known Allergies Prior to Admission medications   Medication Sig Start Date End Date Taking? Authorizing Provider  capecitabine (XELODA) 500 MG tablet Take 2 tab (1000 mg) in AM and 3 tab (1500 mg) in PM (12 hours apart) by mouth with water AFTER meal days 1-14. OFF days 15-21 10/30/16  Yes Historical Provider, MD  enalapril (VASOTEC) 10 MG tablet Take 10 mg by mouth at bedtime.    Yes Historical Provider, MD  lidocaine-prilocaine (EMLA) cream Apply cream 1 hour before chemotherapy treatment 10/22/16  Yes Lequita Asal, MD  methocarbamol (ROBAXIN) 500 MG tablet Take 1 tablet (500 mg total) by mouth every 6 (six) hours as needed for muscle spasms. 12/04/16  Yes Fritzi Mandes, MD  omeprazole (PRILOSEC) 20 MG capsule Take 1 capsule (20 mg total) by mouth daily. 10/11/16  Yes Olean Ree, MD  ondansetron (ZOFRAN) 8 MG tablet Take 1 tablet (8 mg) by mouth every 12 hours 30 minutes prior to each dose of oral chemotherapy to prevent nausea. 10/30/16  Yes Historical Provider, MD  oxyCODONE (OXY IR/ROXICODONE) 5 MG immediate release  tablet Take 1-2 tablets (5-10 mg total) by mouth every 4 (four) hours as needed for moderate pain or severe pain (mild pain). Patient taking differently: Take 5 mg by mouth every 4 (four) hours as needed for moderate pain or severe pain (mild pain).  10/27/16  Yes Gladstone Lighter, MD  Polyethylene Glycol POWD Take 17 g by mouth daily. 10/11/16  Yes Olean Ree, MD  prochlorperazine (COMPAZINE) 10 MG tablet Take by mouth. 10/30/16  Yes Historical Provider, MD  enoxaparin (LOVENOX) 40 MG/0.4ML injection Inject 0.4 mLs (40 mg total) into the skin daily. Patient not taking: Reported on 01/11/2017 12/04/16   Fritzi Mandes, MD  feeding supplement, ENSURE ENLIVE, (ENSURE ENLIVE) LIQD Take 237 mLs by mouth 2 (two) times daily between meals. 10/27/16   Gladstone Lighter, MD   Dg Humerus Left  Result Date: 01/11/2017 CLINICAL DATA:  Left arm pain.  Fall. EXAM: LEFT HUMERUS - 2+ VIEW COMPARISON:  None. FINDINGS: There is a ill-defined area of bone destruction noted within the mid to distal left humeral shaft, along with a pathologic fracture through the midportion of this area. The areas concerning for metastasis. Soft tissues are intact. IMPRESSION: Moth-eaten appearance of the mid to distal left humeral shaft concerning for metastasis. Associated transverse pathologic fracture through the midportion of the lesion. Electronically Signed   By: Rolm Baptise M.D.   On: 01/11/2017 16:08    Positive ROS: All other systems have been reviewed and were otherwise negative with the exception of those mentioned in the HPI and as above.  Physical Exam: General: Alert, no acute distress Cardiovascular: No pedal edema Respiratory: No cyanosis, no use of accessory musculature GI: No organomegaly, abdomen is soft and non-tender Skin: No lesions in the area of chief complaint Neurologic: Sensation intact distally Psychiatric: Patient is competent for consent with normal mood and affect Lymphatic: No axillary or cervical  lymphadenopathy  MUSCULOSKELETAL: Cachectic male lying quietly in the bed.  The left arm is tender and swollen in the mid portion.  Neurovascular status is intact distally with some swelling in the hand.  The skin is intact.  Assessment: Pathologic fracture left humerus secondary to metastatic colon cancer   Plan: Splint for left humerus. I discussed the pros and cons of surgery with the patient's son in regard to his potential longevity. They will contact his oncologist at [facility] to get further recommendations. He may follow up with the [facility] or I will be glad to see him in my office    Park Breed, MD [phone]   01/13/2017 11:54 AM

## 2017-01-13 NOTE — Progress Notes (Signed)
PT Cancellation Note  Patient Details Name: Nathan Atkinson MRN: [id number] DOB: 1951-06-16   Cancelled Treatment:    Reason Eval/Treat Not Completed: Medical issues which prohibited therapy (Rectal tube). Discussed case with RN, patient had spontaneous metastatic fx of R distal humerus. He has had c.diff while in the hospital and now has a rectal tube. Patient is not alert to interact with at this point. RN states with rectal tube he is unable to perform sitting or any bed mobility on this date. Will defer for palliative consultation as it sounds like he may be a hospice candidate or transferred to another facility. He fell and sustained L hip fx ~5-6 weeks ago, he may have equipment needs if the plan is to return home and rectal tube is no longer an option.   Kerman Passey, PT, DPT    01/13/2017, 2:19 PM

## 2017-01-14 DIAGNOSIS — C787 Secondary malignant neoplasm of liver and intrahepatic bile duct: Secondary | ICD-10-CM

## 2017-01-14 DIAGNOSIS — C189 Malignant neoplasm of colon, unspecified: Secondary | ICD-10-CM

## 2017-01-14 DIAGNOSIS — Z7189 Other specified counseling: Secondary | ICD-10-CM

## 2017-01-14 DIAGNOSIS — Z515 Encounter for palliative care: Secondary | ICD-10-CM

## 2017-01-14 LAB — GLUCOSE, CAPILLARY: Glucose-Capillary: 150 mg/dL — ABNORMAL HIGH (ref 65–99)

## 2017-01-14 MED ORDER — FLUCONAZOLE IN SODIUM CHLORIDE 100-0.9 MG/50ML-% IV SOLN
100.0000 mg | INTRAVENOUS | Status: DC
  Administered 2017-01-14 – 2017-01-15 (×2): 100 mg via INTRAVENOUS
  Filled 2017-01-14 (×4): qty 50

## 2017-01-14 MED ORDER — LOPERAMIDE HCL 2 MG PO CAPS: 4.0000 mg | ORAL_CAPSULE | ORAL | Status: DC | PRN

## 2017-01-14 MED ORDER — MAGIC MOUTHWASH
10.0000 mL | Freq: Four times a day (QID) | ORAL | Status: DC
  Administered 2017-01-14 – 2017-01-16 (×7): 10 mL via ORAL
  Filled 2017-01-14 (×9): qty 10

## 2017-01-14 MED ORDER — LOPERAMIDE HCL 2 MG PO CAPS
2.0000 mg | ORAL_CAPSULE | Freq: Once | ORAL | Status: AC
  Administered 2017-01-14: 2 mg via ORAL
  Filled 2017-01-14: qty 1

## 2017-01-14 MED ORDER — MORPHINE SULFATE (PF) 2 MG/ML IV SOLN
2.0000 mg | INTRAVENOUS | Status: DC | PRN
  Administered 2017-01-14 (×2): 4 mg via INTRAVENOUS
  Administered 2017-01-15 (×2): 2 mg via INTRAVENOUS
  Filled 2017-01-14: qty 1
  Filled 2017-01-14: qty 2
  Filled 2017-01-14: qty 1
  Filled 2017-01-14: qty 2

## 2017-01-14 NOTE — Care Management (Addendum)
Patient active with [facility] for home health PT, OT and SN.

## 2017-01-14 NOTE — Progress Notes (Addendum)
[facility] NAME: Nathan Atkinson    MR#:  [id number]  DATE OF BIRTH:  09/02/1951  SUBJECTIVE: Admitted because of shoulder pain and found to have left shoulder fracture. Patient has no history of trauma, found to have pathological fracture of the left humerus. He also has hyponatremia. Stent is seen today, poor by mouth intake, denies any other complaints. Rectal tube draining liquid stool. Stool for C. difficile negative.    CHIEF COMPLAINT:   Chief Complaint  Patient presents with  . Arm Pain    REVIEW OF SYSTEMS:   ROS CONSTITUTIONAL: No fever,  Has fatigue or weakness.  EYES: No blurred or double vision.  EARS, NOSE, AND THROAT: No tinnitus or ear pain.  RESPIRATORY: No cough, shortness of breath, wheezing or hemoptysis.  CARDIOVASCULAR: No chest pain, orthopnea, edema.  GASTROINTESTINAL: No nausea, vomiting.has diarrhoea.  GENITOURINARY: No dysuria, hematuria.  ENDOCRINE: No polyuria, nocturia,  HEMATOLOGY: No anemia, easy bruising or bleeding SKIN: No rash or lesion. MUSCULOSKELETAL:  Left Shoulder pain.   NEUROLOGIC: No tingling, numbness, weakness.  PSYCHIATRY: No anxiety or depression.   DRUG ALLERGIES:  No Known Allergies  VITALS:  Blood pressure 117/72, pulse 97, temperature 97.9 F (36.6 C), temperature source Oral, resp. rate 16, height 5\' 2"  (1.575 m), weight 59 kg (130 lb), SpO2 100 %.  PHYSICAL EXAMINATION:  GENERAL:  66 y.o.-year-old patient lying in the bed with no acute distress. Cachectic. EYES: Pupils equal, round, reactive to light and accommodation. No scleral icterus. Extraocular muscles intact.  HEENT: Head atraumatic, normocephalic. Oropharynx and nasopharynx clear.  NECK:  Supple, no jugular venous distention. No thyroid enlargement, no tenderness.  LUNGS: Normal breath sounds bilaterally, no wheezing, rales,rhonchi or crepitation. No use of accessory muscles of respiration.   CARDIOVASCULAR: S1, S2 normal. No murmurs, rubs, or gallops.  ABDOMEN: Soft, nontender, nondistended. Bowel sounds present. No organomegaly or mass.  EXTREMITIES: No pedal edema, cyanosis, or clubbing. Left shoulder in sling. NEUROLOGIC: Cranial nerves II through XII are intact. Muscle strength 5/5 in all extremities. Sensation intact. Gait not checked.  PSYCHIATRIC: The patient is alert and oriented x 3.  SKIN: Nsin rash in buttock due to diarrhoea  LABORATORY PANEL:   CBC  Recent Labs Lab 01/12/17 0455  WBC 5.6  HGB 10.6*  HCT 29.9*  PLT 53*   ------------------------------------------------------------------------------------------------------------------  Chemistries   Recent Labs Lab 01/11/17 1704  01/13/17 0550  NA 121*  < > 127*  K 4.1  < > 3.3*  CL 91*  < > 100*  CO2 19*  < > 19*  GLUCOSE 196*  < > 152*  BUN 47*  < > 31*  CREATININE 1.37*  < > 0.61  CALCIUM 7.7*  < > 7.5*  AST 61*  --   --   ALT 27  --   --   ALKPHOS 387*  --   --   BILITOT 3.3*  --   --   < > = values in this interval not displayed. ------------------------------------------------------------------------------------------------------------------  Cardiac Enzymes  Recent Labs Lab 01/11/17 1704  TROPONINI <0.03   ------------------------------------------------------------------------------------------------------------------  RADIOLOGY:  No results found.  EKG:   Orders placed or performed during the hospital encounter of 11/30/16  . ED EKG  . ED EKG  . EKG 12-Lead  . EKG 12-Lead    ASSESSMENT AND PLAN:   1.: Pathological fracture of left humerus: Patient has sling for left humerus. Orthopedic consulted. Family  wished to talk to her oncologist at [facility], then decide whether or not to proceed with surgery.   #2 recent left femur fracture status post repair, continue physical therapy, physical therapy recommended home health physical therapy.  #3 .hyponatremia secondary to  dehydration: Improving with IV hydration; sodium improved from 121-126.   #4 .metastatic adenocarcinoma of the colon: Patient is on Xeloda, follows up with [facility]: Seen by oncology, hold chemotherapy while in hospital. For diarrhea patient with rectal tube, C. difficile, PCR is negative.   #5. cancer pain: Continue oxycodone to help with the humerus fracture also. #6 acute severe diarrhea: Likely gastroenteritis versus chemotherapy-induced: Check stool for GI PCR, if negative will give Imodium. Added rectal tube.  7,severe Malnutrition patient to does not want to ensure and does not want to eat anything.d/w son D/w son Prognosis poor;  Spoke with palliative care team today.  likely discharge home tomorrow with home health physical therapy,and may be hospice services?  All the records are reviewed and case discussed with Care Management/Social Workerr. Management plans discussed with the patient, family and they are in agreement.  CODE STATUS: full TOTAL TIME TAKING CARE OF THIS PATIENT: 36minutes.   POSSIBLE D/C IN 1-2 DAYS, DEPENDING ON CLINICAL CONDITION.   Nathan Atkinson M.D on 01/14/2017 at 12:39 PM  Between 7am to 6pm - Pager - [phone]  After 6pm go to [email or web address] - password [email or web address] [facility]  Office  [phone]  CC: Primary care physician; Nathan Apo, MD   Note: This dictation was prepared with Dragon dictation along with smaller phrase technology. Any transcriptional errors that result from this process are unintentional.

## 2017-01-14 NOTE — Progress Notes (Signed)
Subjective:       Patient reports pain as moderate.  Coaptation splint applied to left arm.  Shoulder immobilizer ordered.     Objective:   VITALS:   Vitals:   01/14/17 0402 01/14/17 0722  BP: 110/67 117/72  Pulse: (!) 101 97  Resp: 19 16  Temp: 97.9 F (36.6 C)     Neurologically intact ABD soft Neurovascular intact Sensation intact distally Intact pulses distally Dorsiflexion/Plantar flexion intact pain mid humerus.  skin intact  LABS  Recent Labs  01/11/17 1704 01/12/17 0455  HGB 11.5* 10.6*  HCT 32.7* 29.9*  WBC 5.8 5.6  PLT 62* 53*     Recent Labs  01/11/17 1704 01/12/17 0455 01/13/17 0550  NA 121* 126* 127*  K 4.1 3.6 3.3*  BUN 47* 46* 31*  CREATININE 1.37* 1.14 0.61  GLUCOSE 196* 156* 152*    No results for input(s): LABPT, INR in the last 72 hours.   Assessment/Plan:      Up with therapy Discharge home with home health  F/U with [facility] or my office as family wishes,

## 2017-01-14 NOTE — Evaluation (Signed)
Physical Therapy Evaluation Patient Details Name: Nathan Atkinson MRN: [id number] DOB: 04/19/1951 Today's Date: 01/14/2017   History of Present Illness  Pt admitted for pathological L supracondylar humerus fx. Pt with recent admission in Dec 2017 for L hip ORIF s/p fall. Pt with history of colon cancer with mets, DM, and HTN. Pt currently with rectal tube, limited in OOB mobility this date. Per Dr. Rudene Christians, pt is WBAT on L LE.  Clinical Impression  Pt is a pleasant 66 year old male who was admitted for L UE pathological fx. Spanish interpreter used throughout session. Mobility not performed this date secondary to medical condition and severe pain. Pt premedicated prior to therapy. Pt agreeable to bed there-ex. Pt demonstrates deficits with strength/pain/mobility. Pt with depressed affect. Pt is not a good therapy candidate. Not appropriate for SNF at this time. Will keep QD at this time until further POC established. Would benefit from skilled PT to address above deficits and promote optimal return to PLOF. Recommend transition to [place] upon discharge from acute hospitalization.       Follow Up Recommendations Home health PT;Supervision/Assistance - 24 hour (pending progress to participate; may need higher level care)    Equipment Recommendations  Hospital bed (ramp)    Recommendations for Other Services       Precautions / Restrictions Precautions Precautions: Fall Restrictions Weight Bearing Restrictions: Yes LUE Weight Bearing: Non weight bearing      Mobility  Bed Mobility               General bed mobility comments: not performed secondary to RN request, pain with movement, and rectal tube  Transfers                    Ambulation/Gait                Stairs            Wheelchair Mobility    Modified Rankin (Stroke Patients Only)       Balance Overall balance assessment: History of Falls                                            Pertinent Vitals/Pain Pain Assessment: 0-10 Pain Score: 10-Worst pain ever Pain Location: L arm Pain Descriptors / Indicators: Aching;Constant;Discomfort Pain Intervention(s): Limited activity within patient's tolerance;Premedicated before session    [place] expects to be discharged to:: Private residence Living Arrangements: Spouse/significant other Available Help at Discharge: Family Type of Home: Mobile home Home Access: Stairs to enter Entrance Stairs-Rails: Can reach both Entrance Stairs-Number of Steps: 6 Home Layout: One level Home Equipment: Gage - 2 wheels;Bedside commode      Prior Function Level of Independence: Needs assistance   Gait / Transfers Assistance Needed: ambulated around home with RW  ADL's / Homemaking Assistance Needed: Prior to cancer tx, patient was independent with all ADLs and 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  Within the last month patient has required increased assistance at home with daily tasks.         Hand Dominance        Extremity/Trunk Assessment   Upper Extremity Assessment Upper Extremity Assessment: Generalized weakness (L grip strength 2/5; not assessed further; sling)    Lower Extremity Assessment Lower Extremity Assessment: Generalized weakness (L LE grossly 3/5; R LE grossly 3+/5)  Communication   Communication: Prefers language other than English  Cognition Arousal/Alertness: Suspect due to medications Behavior During Therapy: WFL for tasks assessed/performed Overall Cognitive Status: Within Functional Limits for tasks assessed                      General Comments      Exercises Other Exercises Other Exercises: supine ther-ex performed on B LE including ankle pumps, heel slides, SLRs, and hip abd/add. All ther-ex performed with min assist on B LE and cues for sequencing.   Assessment/Plan    PT Assessment Patient needs continued PT services  PT Problem  List Decreased strength;Decreased activity tolerance;Decreased mobility;Pain          PT Treatment Interventions DME instruction;Gait training;Therapeutic exercise;Therapeutic activities    PT Goals (Current goals can be found in the Care Plan section)  Acute Rehab PT Goals Patient Stated Goal: pt wishes to die PT Goal Formulation: With patient Time For Goal Achievement: 01/28/17 Potential to Achieve Goals: Poor    Frequency 7X/week   Barriers to discharge        Co-evaluation               End of Session   Activity Tolerance: Patient limited by pain;Treatment limited secondary to medical complications (Comment) Patient left: in bed;with call bell/phone within reach;with family/visitor present;with bed alarm set Nurse Communication: Mobility status         Time: [id number] PT Time Calculation (min) (ACUTE ONLY): 19 min   Charges:   PT Evaluation $PT Eval High Complexity: 1 Procedure     PT G Codes:        Esthefany Herrig January 29, 2017, 12:07 PM  Greggory Stallion, PT, DPT [phone]

## 2017-01-14 NOTE — Consult Note (Signed)
Consultation Note Date: 01/14/2017   Patient Name: Nathan Atkinson  DOB: 10-04-1951  MRN: [id number]  Age / Sex: 66 y.o., male  PCP: Nathan Lank, MD Referring Physician: Epifanio Lesches, MD  Reason for Consultation: Disposition, Establishing goals of care, Hospice Evaluation, Non pain symptom management, Pain control and Terminal Care regarding metastatic colon cancer.  HPI/Patient Profile: 66 y.o. 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  He was found to have a pathologic humorus fracture, as well as severe hyponatremia.  Of note, he has developed severe diarrhea.  Testing to determine the source of the diarrhea is negative.  Nathan Atkinson has been receiving chemotherapy at [facility], but has halted Xeloda in hospital due to persistent diarrhea.  Orthopedics is following, will transition from sling to splint later today - he is not a candidate for surgery.   Clinical Assessment and Goals of Care: We arrive to find Nathan Atkinson lying in bed, leaning to the right, favoring his left side.  His left arm is in a sling.  He is accompanied by his wife, Nathan Atkinson, and son, Nathan Atkinson.  Nathan Atkinson speaks [place] fluently, but his parents understand very little english.  Nathan Atkinson does endorse pain in his shoulder, and his son tells [place] he thinks eating has been hurting his mouth as well.  Upon inspection, he has thrush on his tongue.  Otherwise, he does not appear agitated, only subdued.   We continue our conversation in a private waiting room with Nathan Atkinson, and his brother, Nathan Atkinson.  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  He has five children, all of whom live in the [place] area.  The family seems to have a solid understanding of the  extent of Nathan Atkinson disease, and know that his cancer has spread to his lungs, liver, and retroperitoneum.  They confirmed that he is to get a CT scan on 2/13 to reassess metastases.  In the meantime, the family tells [place] he would most like to be at home, and wonder about methods to increase his comfort.  It sounds like they have had PT or home health services that send someone to the house twice a week, but the family would like more frequent support.  We explain in-home hospice services, and they are very receptive to the idea.  They want to prioritize Nathan Atkinson comfort in light of the extent of his illness.  We agreed to meet tomorrow with patient, family, and an interpretor to discuss a plan after discharge, including end-of-life care.  After meeting with the family, I spoke briefly with Nathan Atkinson of [facility].  Nathan Atkinson pointed out that chemotherapy seemed to be helpful and suggested that if he is able to recover from the diarrhea he may benefit from further chemotherapy.  Alternatively we did discuss hospice services as well.    Primary  Decision Maker:  PATIENT     SUMMARY OF RECOMMENDATIONS    Family meeting at 8:30 am on 2/6 with Spanish interpreter to discuss [place] and code status.  Family indicates that he needs assistance at home more frequently.  Code Status/Advance Care Planning:  Full code   Symptom Management:   Pain: IV morphine - increased frequency  Arm Pain:  Will request that ortho immobilize the fractured arm  Oral Thrush: Diflucan and magic mouth wash.  D/C Protonix.  Diarrhea will increase imodium 4 mg PRN loose stools also d/c ensure supplements as they contribute to diarrhea.  If increased imodium is not effective we could consider octreotide.  Additional Recommendations (Limitations, Scope, Preferences):  Full Scope Treatment   Psycho-social/Spiritual:   Desire for further Chaplaincy support: unknown   Prognosis:   < 3 months will  continue to refine based on hospital progression.  This estimate given based on widely metastatic colon cancer to the lungs, liver and retroperitoneum combined with minimal PO intake and ambulation.  Discharge Planning: To Be Determined      Primary Diagnoses: Present on Admission: . Left supracondylar humerus fracture, closed, initial encounter   I have reviewed the medical record, interviewed the patient and family, and examined the patient. The following aspects are pertinent.  Past Medical History:  Diagnosis Date  . Cataract 08/2015   forming  . Colorectal cancer ([place]) 10/08/2016  . Diabetes mellitus without complication ([place])   . Hyperlipidemia   . Hypertension   . Platelets decreased ([place])    Social History   Social History  . Marital status: Married    Spouse name: N/A  . Number of children: N/A  . Years of education: N/A   Social History Main Topics  . Smoking status: Former Smoker    Years: 10.00    Types: Cigarettes    Quit date: 06/15/1995  . Smokeless tobacco: Never Used  . Alcohol use No  . Drug use: No  . Sexual activity: Not Asked   Other Topics Concern  . None   Social History Narrative  . None   Family History  Problem Relation Age of Onset  . Cancer Brother     not sure what kind   Scheduled Meds: . capecitabine  1,500 mg Oral QPC supper  . enoxaparin  40 mg Subcutaneous Q24H  . feeding supplement (ENSURE ENLIVE)  237 mL Oral BID BM  . fluconazole (DIFLUCAN) IV  100 mg Intravenous Q24H  . magic mouthwash w/lidocaine  10 mL Oral QID  . pantoprazole  40 mg Oral Daily   Continuous Infusions: . sodium chloride 50 mL/hr at 01/11/17 2253   PRN Meds:.acetaminophen **OR** acetaminophen, HYDROcodone-acetaminophen, methocarbamol, morphine injection, ondansetron **OR** ondansetron (ZOFRAN) IV No Known Allergies   Review of Systems  Constitutional: Positive for appetite change and unexpected weight change.  HENT:       Difficulty swallowing  due to discomfort  Gastrointestinal: Positive for diarrhea and nausea.  Musculoskeletal:       Left arm pain  Neurological: Positive for weakness.   Physical Exam Elderly-appearing man in no apparent distress Cachectic, weak Oral thrush on tongue surface Left arm in sling, tender to light palpation  Vital Signs: BP 117/72 (BP Location: Right Arm)   Pulse 97   Temp 97.9 F (36.6 C) (Oral)   Resp 16   Ht 5\' 2"  (1.575 m)   Wt 59 kg (130 lb)   SpO2 100%   BMI 23.78 kg/m  Pain Assessment:  0-10 POSS *See Group Information*: S-Acceptable,Sleep, easy to arouse Pain Score: 8    SpO2: SpO2: 100 % O2 Device:SpO2: 100 % O2 Flow Rate: .   IO: Intake/output summary:  Intake/Output Summary (Last 24 hours) at 01/14/17 1321 Last data filed at 01/14/17 0400  Gross per 24 hour  Intake             2665 ml  Output              280 ml  Net             2385 ml    LBM: Last BM Date: 01/13/17 Baseline Weight: Weight: 54.4 kg (120 lb) Most recent weight: Weight: 59 kg (130 lb)     Palliative Assessment/Data:   Flowsheet Rows   Flowsheet Row Most Recent Value  Intake Tab  Referral Department  Hospitalist  Unit at Time of Referral  ICU  Palliative Care Primary Diagnosis  Cancer  Date Notified  01/13/17  Palliative Care Type  New Palliative care  Reason for referral  Clarify Goals of Care, Counsel Regarding Hospice, Non-pain Symptom, Pain  Date of Admission  01/11/17  Date first seen by Palliative Care  01/14/17  # of days Palliative referral response time  1 Day(s)  # of days IP prior to Palliative referral  2  Clinical Assessment  Palliative Performance Scale Score  20%  Psychosocial & Spiritual Assessment  Palliative Care Outcomes  Patient/Family meeting held?  Yes  Who was at the meeting?  Patient, patient's wife Nathan Atkinson), son Nathan Atkinson), son Nathan Atkinson)  Palliative Care Outcomes  Clarified goals of care, Improved non-pain symptom therapy, Improved pain interventions, Counseled  regarding hospice      Time In: 12:20 Time Out: 1:30 Time Total: 70 minutes Greater than 50%  of this time was spent counseling and coordinating care related to the above assessment and plan.  Signed by: Olene Floss, PA-S Imogene Burn, PA-C Palliative Medicine Pager: [phone]   Please contact Palliative Medicine Team phone at [phone] for questions and concerns.  For individual provider: See Shea Evans

## 2017-01-15 ENCOUNTER — Inpatient Hospital Stay: Payer: 59

## 2017-01-15 DIAGNOSIS — Z66 Do not resuscitate: Secondary | ICD-10-CM

## 2017-01-15 DIAGNOSIS — R197 Diarrhea, unspecified: Secondary | ICD-10-CM

## 2017-01-15 DIAGNOSIS — R112 Nausea with vomiting, unspecified: Secondary | ICD-10-CM

## 2017-01-15 LAB — GLUCOSE, CAPILLARY: GLUCOSE-CAPILLARY: 151 mg/dL — AB (ref 65–99)

## 2017-01-15 MED ORDER — LORAZEPAM 2 MG/ML IJ SOLN
0.5000 mg | Freq: Every day | INTRAMUSCULAR | Status: DC
  Administered 2017-01-15: 0.5 mg via INTRAVENOUS
  Filled 2017-01-15: qty 1

## 2017-01-15 MED ORDER — POLYVINYL ALCOHOL 1.4 % OP SOLN
1.0000 [drp] | Freq: Four times a day (QID) | OPHTHALMIC | Status: DC | PRN
  Filled 2017-01-15: qty 15

## 2017-01-15 MED ORDER — LOPERAMIDE HCL 2 MG PO CAPS
4.0000 mg | ORAL_CAPSULE | Freq: Three times a day (TID) | ORAL | Status: DC
  Administered 2017-01-15 – 2017-01-16 (×2): 4 mg via ORAL
  Filled 2017-01-15 (×3): qty 2

## 2017-01-15 MED ORDER — IOPAMIDOL (ISOVUE-300) INJECTION 61%: 30.0000 mL | Freq: Once | INTRAVENOUS | Status: DC

## 2017-01-15 MED ORDER — MORPHINE SULFATE (PF) 2 MG/ML IV SOLN
2.0000 mg | INTRAVENOUS | Status: DC | PRN
  Administered 2017-01-16: 4 mg via INTRAVENOUS
  Administered 2017-01-16: 2 mg via INTRAVENOUS
  Administered 2017-01-16: 4 mg via INTRAVENOUS
  Filled 2017-01-15 (×2): qty 2
  Filled 2017-01-15: qty 1

## 2017-01-15 MED ORDER — DEXTROSE 5 % IV SOLN
500.0000 mg | Freq: Four times a day (QID) | INTRAVENOUS | Status: DC | PRN
  Filled 2017-01-15: qty 5

## 2017-01-15 MED ORDER — LORAZEPAM 2 MG/ML IJ SOLN: 1.0000 mg | INTRAMUSCULAR | Status: DC | PRN

## 2017-01-15 MED ORDER — ONDANSETRON HCL 4 MG/2ML IJ SOLN
4.0000 mg | Freq: Four times a day (QID) | INTRAMUSCULAR | Status: DC
  Administered 2017-01-15 – 2017-01-16 (×4): 4 mg via INTRAVENOUS
  Filled 2017-01-15 (×4): qty 2

## 2017-01-15 MED ORDER — LORAZEPAM 2 MG/ML PO CONC: 1.0000 mg | ORAL | Status: DC | PRN

## 2017-01-15 MED ORDER — PROMETHAZINE HCL 25 MG/ML IJ SOLN: 12.5000 mg | Freq: Four times a day (QID) | INTRAMUSCULAR | Status: DC | PRN

## 2017-01-15 MED ORDER — LORAZEPAM 1 MG PO TABS: 1.0000 mg | ORAL_TABLET | ORAL | Status: DC | PRN

## 2017-01-15 MED ORDER — BIOTENE DRY MOUTH MT LIQD: 15.0000 mL | OROMUCOSAL | Status: DC | PRN

## 2017-01-15 MED ORDER — IOPAMIDOL (ISOVUE-300) INJECTION 61%
75.0000 mL | Freq: Once | INTRAVENOUS | Status: AC | PRN
  Administered 2017-01-15: 75 mL via INTRAVENOUS

## 2017-01-15 MED ORDER — OLANZAPINE 5 MG PO TBDP
5.0000 mg | ORAL_TABLET | Freq: Every day | ORAL | Status: DC
  Administered 2017-01-15 – 2017-01-16 (×2): 5 mg via ORAL
  Filled 2017-01-15 (×2): qty 1

## 2017-01-15 MED ORDER — LOPERAMIDE HCL 2 MG PO CAPS: 4.0000 mg | ORAL_CAPSULE | Freq: Once | ORAL | Status: DC

## 2017-01-15 MED ORDER — LOPERAMIDE HCL 2 MG PO CAPS
4.0000 mg | ORAL_CAPSULE | Freq: Three times a day (TID) | ORAL | Status: DC
  Administered 2017-01-15: 4 mg via ORAL
  Filled 2017-01-15: qty 2

## 2017-01-15 NOTE — Care Management (Signed)
Spoke with Nathan Atkinson. She states patient anfd family would like the [facility] but prior to that they would like to have some CT scans to confirm what they are suspecting. Prognosis less that 2 weeks. CSW updated. Will assist as needed.

## 2017-01-15 NOTE — Progress Notes (Signed)
Clinical Education officer, museum (CSW) received a verbal consult from palliative NP that patient and family want [facility] [employer]. CSW met with family and confirmed this. CSW made a referral to [facility]. CSW will continue to follow and assist as needed.   [employer], LCSW [phone]

## 2017-01-15 NOTE — Progress Notes (Signed)
PT Cancellation Note  Patient Details Name: Nathan Atkinson MRN: [id number] DOB: 04-01-1951   Cancelled Treatment:    Reason Eval/Treat Not Completed: Other (comment). Treatment attempted; family in the room and pt sleeping. Family does not wish therapy with pt today. Family notes pt is more than likely going to Hospice home; awaiting test for final decision. Check discharge disposition plan tomorrow and proceed as appropriate.    Larae Grooms, PTA 01/15/2017, 1:58 PM

## 2017-01-15 NOTE — Progress Notes (Signed)
Chaplain responded to an order in which the patient requested creating an Advance Directives. Patient and his wife could not speak [place]. Chaplain used interpreter service to provide education.

## 2017-01-15 NOTE — Progress Notes (Signed)
   01/15/17 1900  Clinical Encounter Type  Visited With Family;Patient and family together  Visit Type Follow-up;Psychological support;Spiritual support;Patient actively dying  Referral From Palliative care team  Spiritual Encounters  Spiritual Needs Emotional;Grief support;Ritual  Ch responded to request from [place]; Family requested anointing of the sick; [facility] contacted [facility] who will have Spanish speaking Yevonne Pax come out to give the rite. Gwynn Burly 7:49 PM

## 2017-01-15 NOTE — Progress Notes (Signed)
No charge note.  We reviewed the CT scans with the [place] family.  They show significant progression of liver and bone mets to the thoracic spine as well as a new pleural effusion.  Mr. Nathan Atkinson requests [facility].  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  His sons have visited there as well.  We discussed his symptom management.  He decided to leave his rectal tube in place for now - in order to avoid the burning diarrhea would cause and pain from moving his arm for frequent clean ups.  We discussed changing his medications over to full comfort.  The family re-requested a transfer to [facility] as soon as possible.  Social work is aware of the request.   Imogene Burn, PA-C Palliative Medicine Pager: [phone]

## 2017-01-15 NOTE — Progress Notes (Signed)
New hospice home referral received from [place]. 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  He was found to have a pathologic humerus fracture, as well as severe hyponatremia.  Mr. Nathan Atkinson has been receiving chemotherapy at [facility], but has the Xeloda during this  hospitalization due to persistent diarrhea. He had repeat CT scans of the abdomen, chest and pelvis today showing and increase in the size and number of hepatic lesions, ascites and new pleural effusions as well as diffuse metastatic disease involving the thoracic and lumbar spine and left iliac bone and right acetabulum. Family has met again with Palliative Medicine PA Imogene Burn and have chosen to focus on comfort with transition to the hospice home. Writer met in the room with [name], [name] and another family member, along with the [name],  to initiate education regarding hospice services, philosophy and team approach to care with understanding voiced. Questions answered, consents signed. Patient was present but did not participate in the conversation, he was able to nod hello when spoken to. Patient information faxed to referral. Plan is for transport to the hospice home tomorrow with signed DNR in place. Writer to follow up with hospital care team and family in the morning. Thank you. Nathan Shanks RN, BSN, [facility] [place], hospital Liaison [phone] c

## 2017-01-15 NOTE — Progress Notes (Signed)
Daily Progress Note   Patient Name: Nathan Atkinson       Date: 01/15/2017 DOB: [birth year]  Age: 66 y.o. MRN#: [id number] Attending Physician: Epifanio Lesches, MD Primary Care Physician: Baltazar Apo, MD Admit Date: 01/11/2017  Reason for Consultation/Follow-up: Establishing goals of care given advanced metastatic cancer and severe nausea and diarrhea  Subjective:  We spoke at length with Mr. Nathan Atkinson and his family with the help of [place] the Interpreter.  Mr. Nathan Atkinson feels very poorly and is dry heaving thru our conversation.  He understands his cancer is wide spread.  He feels he can not be cared for at home anymore.  We discussed his oral thrush and his diarrhea.  The GI studies are negative for infection leaving the chemo therapy as the most likely etiology of the diarrhea and thrush.  Mr. Nathan Atkinson was scheduled for CT scans at [facility] on 2/13.  He wants his care to be completed here.  He does not have the strength to go to his doctors office at [facility].  We offered to attempt to get the CT scans done here today for him and potentially Dr. Berneice Gandy ([place]) could review them.  Nathan Atkinson and Nathan Atkinson arrive.  They have visited the [facility] and want their father to receive his EOL care there.  The committed to their father that they will be there with him.  Mr. 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   He is in agreement.  We discussed code status.  Mr. Nathan Atkinson has been thinking about this lately and he does not want resuscitation.  He wants to be a DNR.  I questioned whether or not the CT scans were needed if it was likely that he would discharge to hospice house.  Mr. Nathan Atkinson indicated that he needed those scans in order to make his  final decisions.  Patient Profile/HPI: 66 y.o. 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  He was found to have a pathologic humorus fracture, as well as severe hyponatremia.  Of note, he has developed severe diarrhea.  Testing to determine the source of the diarrhea is negative.  Mr. Nathan Atkinson has been  receiving chemotherapy at [facility], but has halted Xeloda in hospital due to persistent diarrhea.  Orthopedics is following, will transition from sling to splint later today - he is not a candidate for surgery.   Length of Stay: 4  Current Medications: Scheduled Meds:  . enoxaparin  40 mg Subcutaneous Q24H  . fluconazole (DIFLUCAN) IV  100 mg Intravenous Q24H  . loperamide  4 mg Oral TID  . magic mouthwash  10 mL Oral QID  . OLANZapine zydis  5 mg Oral Daily  . ondansetron (ZOFRAN) IV  4 mg Intravenous Q6H    Continuous Infusions: . sodium chloride 50 mL/hr at 01/14/17 1612    PRN Meds: acetaminophen **OR** acetaminophen, loperamide, methocarbamol, morphine injection, ondansetron **OR** ondansetron (ZOFRAN) IV, promethazine  Physical Exam        Frail ill appearing male, A&O very weak Cachectically thin.  Dry heaving intermittently thru our conversation.  Vital Signs: BP 115/75 (BP Location: Right Arm)   Pulse (!) 101   Temp 98.3 F (36.8 C) (Oral)   Resp 18   Ht 5\' 2"  (1.575 m)   Wt 61.4 kg (135 lb 4.8 oz)   SpO2 100%   BMI 24.75 kg/m  SpO2: SpO2: 100 % O2 Device: O2 Device: Not Delivered O2 Flow Rate:    Intake/output summary:  Intake/Output Summary (Last 24 hours) at 01/15/17 [id number] Last data filed at 01/15/17 0400  Gross per 24 hour  Intake             1250 ml  Output              250 ml  Net             1000 ml   LBM: Last BM Date: 01/14/17 Baseline Weight: Weight: 54.4 kg (120 lb) Most recent weight:  Weight: 61.4 kg (135 lb 4.8 oz)       Palliative Assessment/Data:    Flowsheet Rows   Flowsheet Row Most Recent Value  Intake Tab  Referral Department  Hospitalist  Unit at Time of Referral  Med/Surg Unit  Palliative Care Primary Diagnosis  Cancer  Date Notified  01/13/17  Palliative Care Type  New Palliative care  Reason for referral  Pain, Clarify Goals of Care  Date of Admission  01/11/17  Date first seen by Palliative Care  01/14/17  # of days Palliative referral response time  1 Day(s)  # of days IP prior to Palliative referral  2  Clinical Assessment  Palliative Performance Scale Score  30%  Psychosocial & Spiritual Assessment  Palliative Care Outcomes  Patient/Family meeting held?  Yes  Who was at the meeting?  wife and two sons  Palliative Care Outcomes  Improved pain interventions, Improved non-pain symptom therapy, Counseled regarding hospice, Clarified goals of care      Patient Active Problem List   Diagnosis Date Noted  . Metastatic colon cancer to liver ([place])   . Palliative care encounter   . Goals of care, counseling/discussion   . Encounter for hospice care discussion   . Left supracondylar humerus fracture, closed, initial encounter 01/11/2017  . Hip fracture ([place]) 12/01/2016  . Hypertension 11/20/2016  . Protein-calorie malnutrition, severe 10/25/2016  . Hyperbilirubinemia 10/24/2016  . 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 Obstructive jaundice 10/24/2016  . Rectal cancer metastasized to liver ([place])   . Port-a-cath in place   . Colorectal cancer ([place]) 10/08/2016  . Liver metastases ([place]) 10/08/2016  . Diabetes mellitus type 2, uncontrolled (  [place]) 05/09/2016  . Diabetes mellitus type 2, controlled ([place]) 09/21/2015    Palliative Care Plan    Recommendations/Plan:  DNR  Complete imaging that was previously scheduled at [facility] for 2/13  Increase anti-emetics scheduled Zofran, Olanzepine and PRN phenergan  Change diet to clear liquids.  Patient  inclined to request Hospice House at D/C if imaging shows progression.   Goals of Care and Additional Recommendations:  Limitations on Scope of Treatment: Full Scope Treatment  Code Status:  DNR  Prognosis:   < 2 weeks given widely metastatic cancer, weakness, inability to eat, +++ diarrhea   Discharge Planning:  To Be Determined  Care plan was discussed with family, attending MD  Thank you for allowing the Palliative Medicine Team to assist in the care of this patient.  Total time spent:  50 min.     Greater than 50%  of this time was spent counseling and coordinating care related to the above assessment and plan.  Imogene Burn, PA-C Palliative Medicine  Please contact Palliative MedicineTeam phone at [phone] for questions and concerns between 7 am - 7 pm.   Please see AMION for individual provider pager numbers.

## 2017-01-15 NOTE — Progress Notes (Signed)
[place] [facility] [place] NAME: Nathan Atkinson    MR#:  [id number]  DATE OF BIRTH:  1951/02/13  SUBJECTIVE: Admitted because of shoulder pain and found to have left shoulder fracture. Patient has no history of trauma, found to have pathological fracture of the left humerus. He also has hyponatremia. Poor by mouth intake is poor, having significant output from rectal tube, seen by palliative care team, CT abdomen, chest will be ordered today . Having a lot of nausea.   CHIEF COMPLAINT:   Chief Complaint  Patient presents with  . Arm Pain    REVIEW OF SYSTEMS:   ROS CONSTITUTIONAL: No fever,  Has fatigue or weakness.  EYES: No blurred or double vision.  EARS, NOSE, AND THROAT: No tinnitus or ear pain.  RESPIRATORY: No cough, shortness of breath, wheezing or hemoptysis.  CARDIOVASCULAR: No chest pain, orthopnea, edema.  GASTROINTESTINAL: nausea, vomiting.has diarrhoea.  GENITOURINARY: No dysuria, hematuria.  ENDOCRINE: No polyuria, nocturia,  HEMATOLOGY: No anemia, easy bruising or bleeding SKIN: No rash or lesion. MUSCULOSKELETAL:  Left Shoulder pain.   NEUROLOGIC: No tingling, numbness, weakness.  PSYCHIATRY: No anxiety or depression.   DRUG ALLERGIES:  No Known Allergies  VITALS:  Blood pressure 115/75, pulse (!) 101, temperature 98.3 F (36.8 C), temperature source Oral, resp. rate 18, height 5\' 2"  (1.575 m), weight 61.4 kg (135 lb 4.8 oz), SpO2 100 %.  PHYSICAL EXAMINATION:  GENERAL:  66 y.o.-year-old patient lying in the bed with no acute distress. Cachectic. EYES: Pupils equal, round, reactive to light and accommodation. No scleral icterus. Extraocular muscles intact.  HEENT: Head atraumatic, normocephalic. Oropharynx and nasopharynx clear.  NECK:  Supple, no jugular venous distention. No thyroid enlargement, no tenderness.  LUNGS: Normal breath sounds bilaterally, no wheezing, rales,rhonchi or crepitation. No use  of accessory muscles of respiration.  CARDIOVASCULAR: S1, S2 normal. No murmurs, rubs, or gallops.  ABDOMEN: Soft, nontender, nondistended. Bowel sounds present. No organomegaly or mass.  EXTREMITIES: No pedal edema, cyanosis, or clubbing. Left shoulder in sling. NEUROLOGIC: Cranial nerves II through XII are intact. Muscle strength 5/5 in all extremities. Sensation intact. Gait not checked.  PSYCHIATRIC: The patient is alert and oriented x 3.  SKIN: rash in buttock due to diarrhoea  LABORATORY PANEL:   CBC  Recent Labs Lab 01/12/17 0455  WBC 5.6  HGB 10.6*  HCT 29.9*  PLT 53*   ------------------------------------------------------------------------------------------------------------------  Chemistries   Recent Labs Lab 01/11/17 1704  01/13/17 0550  NA 121*  < > 127*  K 4.1  < > 3.3*  CL 91*  < > 100*  CO2 19*  < > 19*  GLUCOSE 196*  < > 152*  BUN 47*  < > 31*  CREATININE 1.37*  < > 0.61  CALCIUM 7.7*  < > 7.5*  AST 61*  --   --   ALT 27  --   --   ALKPHOS 387*  --   --   BILITOT 3.3*  --   --   < > = values in this interval not displayed. ------------------------------------------------------------------------------------------------------------------  Cardiac Enzymes  Recent Labs Lab 01/11/17 1704  TROPONINI <0.03   ------------------------------------------------------------------------------------------------------------------  RADIOLOGY:  No results found.  EKG:   Orders placed or performed during the hospital encounter of 11/30/16  . ED EKG  . ED EKG  . EKG 12-Lead  . EKG 12-Lead    ASSESSMENT AND PLAN:   1.: Pathological fracture of left humerus: Patient  has sling for left humerus. Orthopedic consulted. F  #2 recent left femur fracture status post repair,   #3 .hyponatremia secondary to dehydration: Improving with IV hydration; sodium improved from 121-126.   #4 .metastatic adenocarcinoma of the colon: Patient is on Xeloda, follows up  with [facility]: Seen by oncology, hold chemotherapy while in hospital. For diarrhea patient with rectal tube, C. difficile, PCR is negative.Complete imaging that was previously scheduled at [facility] for 2/13.Patient inclined to request Hospice House at D/C if imaging shows progression. Because of ongoing diarrhea, inability to eat, metastatic cancer, generalized weakness prognosis very poor. Continue Zofran, Phenergan for nausea, continue clear liquid diet.   #5. cancer pain: Continue oxycodone to help with the humerus fracture also. #6 acute severe diarrhea: Likely gastroenteritis versus chemotherapy-induced: Check stool for GI PCR, if negative will give Imodium. Added rectal tube.  7,severe Malnutrition patient to does not want to ensure and does not want to eat anything.d/w son D/w son Prognosis poor;  Spoke with palliative care team .   All the records are reviewed and case discussed with Care Management/Social Workerr. Management plans discussed with the patient, family and they are in agreement.  CODE STATUS: DNR TOTAL TIME TAKING CARE OF THIS PATIENT: 21minutes.   POSSIBLE D/C IN 1-2 DAYS, DEPENDING ON CLINICAL CONDITION.   Epifanio Lesches M.D on 01/15/2017 at 12:06 PM  Between 7am to 6pm - Pager - [phone]  After 6pm go to [email or web address] - password [email or web address] [facility]  Office  [phone]  CC: Primary care physician; Baltazar Apo, MD   Note: This dictation was prepared with Dragon dictation along with smaller phrase technology. Any transcriptional errors that result from this process are unintentional.

## 2017-01-16 MED ORDER — LOPERAMIDE HCL 2 MG PO CAPS: 4.0000 mg | ORAL_CAPSULE | ORAL | 0 refills | Status: AC | PRN

## 2017-01-16 MED ORDER — BIOTENE DRY MOUTH MT LIQD: 15.0000 mL | OROMUCOSAL | 0 refills | Status: AC | PRN

## 2017-01-16 MED ORDER — LORAZEPAM 1 MG PO TABS: 1.0000 mg | ORAL_TABLET | ORAL | 0 refills | Status: AC | PRN

## 2017-01-16 MED ORDER — FUROSEMIDE 40 MG PO TABS: 40.0000 mg | ORAL_TABLET | Freq: Every day | ORAL | Status: DC

## 2017-01-16 MED ORDER — MORPHINE SULFATE (CONCENTRATE) 10 MG/0.5ML PO SOLN: 10.0000 mg | ORAL | Status: DC | PRN

## 2017-01-16 MED ORDER — MORPHINE SULFATE (CONCENTRATE) 10 MG/0.5ML PO SOLN: 10.0000 mg | ORAL | 0 refills | Status: AC | PRN

## 2017-01-16 MED ORDER — FUROSEMIDE 10 MG/ML IJ SOLN: 40.0000 mg | Freq: Once | INTRAMUSCULAR | Status: DC

## 2017-01-16 NOTE — Progress Notes (Signed)
Patient alert and oriented. Complaining of some pain, RN gave IV pain medications. No nausea. Left arm in ace bandage. Rectal tube in place and draining well. RN changed rectal tube for transport to [place]. Daughter at bedside. RN and MD used interpreter services to discuss discharge and answered all questions. Hospice nurse called report and EMS for transport.    Nathan Atkinson

## 2017-01-16 NOTE — Progress Notes (Signed)
Follow up visit made to new hospice home referral. Patient seen lying in bed, lethargic. He continues to require IV morphine for management of pain and IV zofran for nausea. Plan is for transfer to the hospice home today. Patient's daughter Angelica present at bedside and made aware. Writer contacted patient's son Jacqulyn Bath via telephone to advise of rnase, he remains in agreement. Hospital care team all aware. Report called to the hospice home, EMS notified for transport. Signed portable DNR in place in discharge packet. Thank you. Flo Shanks RN, BSN, [facility] [place], hospital Liaison [phone] c

## 2017-01-16 NOTE — Progress Notes (Signed)
Patient will D/C to [facility] [place] today. Clinical Education officer, museum (CSW) prepared EMS form, including DNR. [facility] liaison will call report and arrange EMS for transport. RN aware of above. Please reconsult if future social work needs arise. CSW signing off.   [employer], LCSW [phone]

## 2017-01-16 NOTE — Discharge Summary (Signed)
[facility] NAME: Nathan Atkinson    MR#:  [id number]  DATE OF BIRTH:  07/09/1951  DATE OF ADMISSION:  01/11/2017 ADMITTING PHYSICIAN: Epifanio Lesches, MD  DATE OF DISCHARGE: 01/16/2017  PRIMARY CARE PHYSICIAN: Baltazar Apo, MD    ADMISSION DIAGNOSIS:  Hyponatremia [E87.1] Closed fracture of shaft of humerus, unspecified fracture morphology, unspecified laterality, initial encounter [S42.309A]  DISCHARGE DIAGNOSIS:  Active Problems:   Left supracondylar humerus fracture, closed, initial encounter   Metastatic colon cancer to liver [facility])   Palliative care encounter   Goals of care, counseling/discussion   Encounter for hospice care discussion   Diarrhea   Intractable vomiting with nausea   DNR (do not resuscitate)   SECONDARY DIAGNOSIS:   Past Medical History:  Diagnosis Date  . Cataract 08/2015   forming  . Colorectal cancer ([place]) 10/08/2016  . Diabetes mellitus without complication ([place])   . Hyperlipidemia   . Hypertension   . Platelets decreased ([place])     HOSPITAL COURSE:   1.  Pathological fracture left humerus. Patient in sling and pain control. 2. 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 Failure to thrive. Patient will be transferred to the hospice home for end-of-life care. Pain control with Roxanol. Can consider Duragesic patch. 3. Chronic diarrhea. Patient will go with rectal tube when necessary Imodium 4. Hyponatremia secondary to dehydration 5. Recent left femur fracture 6. Severe malnutrition 7. History of diabetes. Sugars acceptable  DISCHARGE CONDITIONS:   Discharged to hospice home today. Prognosis guarded  CONSULTS OBTAINED:  Treatment Team:  Earnestine Leys, MD Cammie Sickle, MD  DRUG ALLERGIES:  No Known Allergies  DISCHARGE MEDICATIONS:   Current Discharge Medication List    START taking these medications   Details   antiseptic oral rinse (BIOTENE) LIQD Apply 15 mLs topically as needed for dry mouth. Qty: 100 mL, Refills: 0    loperamide (IMODIUM) 2 MG capsule Take 2 capsules (4 mg total) by mouth as needed for diarrhea or loose stools. Qty: 30 capsule, Refills: 0    LORazepam (ATIVAN) 1 MG tablet Take 1 tablet (1 mg total) by mouth every 4 (four) hours as needed for anxiety. Qty: 30 tablet, Refills: 0    Morphine Sulfate (MORPHINE CONCENTRATE) 10 MG/0.5ML SOLN concentrated solution Take 0.5 mLs (10 mg total) by mouth every 2 (two) hours as needed for moderate pain or severe pain. Qty: 30 mL, Refills: 0      CONTINUE these medications which have NOT CHANGED   Details  feeding supplement, ENSURE ENLIVE, (ENSURE ENLIVE) LIQD Take 237 mLs by mouth 2 (two) times daily between meals. Qty: 237 mL, Refills: 12      STOP taking these medications     capecitabine (XELODA) 500 MG tablet      enalapril (VASOTEC) 10 MG tablet      lidocaine-prilocaine (EMLA) cream      methocarbamol (ROBAXIN) 500 MG tablet      omeprazole (PRILOSEC) 20 MG capsule      ondansetron (ZOFRAN) 8 MG tablet      oxyCODONE (OXY IR/ROXICODONE) 5 MG immediate release tablet      Polyethylene Glycol POWD      prochlorperazine (COMPAZINE) 10 MG tablet      enoxaparin (LOVENOX) 40 MG/0.4ML injection          DISCHARGE INSTRUCTIONS:    Follow-up with hospice home team today  If you experience worsening of your  admission symptoms, develop shortness of breath, life threatening emergency, suicidal or homicidal thoughts you must seek medical attention immediately by calling 911 or calling your MD immediately  if symptoms less severe.  You Must read complete instructions/literature along with all the possible adverse reactions/side effects for all the Medicines you take and that have been prescribed to you. Take any new Medicines after you have completely understood and accept all the possible adverse reactions/side  effects.   Please note  You were cared for by a hospitalist during your hospital stay. If you have any questions about your discharge medications or the care you received while you were in the hospital after you are discharged, you can call the unit and asked to speak with the hospitalist on call if the hospitalist that took care of you is not available. Once you are discharged, your primary care physician will handle any further medical issues. Please note that NO REFILLS for any discharge medications will be authorized once you are discharged, as it is imperative that you return to your primary care physician (or establish a relationship with a primary care physician if you do not have one) for your aftercare needs so that they can reassess your need for medications and monitor your lab values.    Today   CHIEF COMPLAINT:   Chief Complaint  Patient presents with  . Arm Pain    HISTORY OF PRESENT ILLNESS:  Nathan Atkinson  is a 66 y.o. male with a known history of Metastatic colon cancer presents with arm pain   VITAL SIGNS:  Blood pressure 122/77, pulse (!) 114, temperature 97.5 F (36.4 C), temperature source Oral, resp. rate 12, height 5\' 2"  (1.575 m), weight 61.4 kg (135 lb 4.8 oz), SpO2 98 %.   PHYSICAL EXAMINATION:  GENERAL:  66 y.o.-year-old patient lying in the bed with no acute distress.  EYES: Pupils equal, round, reactive to light and accommodation. No scleral icterus. Extraocular muscles intact.  HEENT: Head atraumatic, normocephalic. Oropharynx and nasopharynx clear.  NECK:  Supple, no jugular venous distention. No thyroid enlargement, no tenderness.  LUNGS: Normal breath sounds bilaterally, no wheezing, rales,rhonchi or crepitation. No use of accessory muscles of respiration.  CARDIOVASCULAR: S1, S2 Tachycardic. No murmurs, rubs, or gallops.  ABDOMEN: Soft, non-tender, non-distended. Bowel sounds present. No organomegaly or mass. Rectal tube  present. EXTREMITIES: No pedal edema, cyanosis, or clubbing.  NEUROLOGIC: Cranial nerves II through XII are intact. Sensation intact. Gait not checked.  PSYCHIATRIC: The patient is alert and oriented x 3.  SKIN: Toes and bottom of the feet having some black spots  DATA REVIEW:   CBC  Recent Labs Lab 01/12/17 0455  WBC 5.6  HGB 10.6*  HCT 29.9*  PLT 53*    Chemistries   Recent Labs Lab 01/11/17 1704  01/13/17 0550  NA 121*  < > 127*  K 4.1  < > 3.3*  CL 91*  < > 100*  CO2 19*  < > 19*  GLUCOSE 196*  < > 152*  BUN 47*  < > 31*  CREATININE 1.37*  < > 0.61  CALCIUM 7.7*  < > 7.5*  AST 61*  --   --   ALT 27  --   --   ALKPHOS 387*  --   --   BILITOT 3.3*  --   --   < > = values in this interval not displayed.  Cardiac Enzymes  Recent Labs Lab 01/11/17 1704  TROPONINI <0.03  Microbiology Results  Results for orders placed or performed during the hospital encounter of 01/11/17  C difficile quick scan w PCR reflex     Status: None   Collection Time: 01/13/17 12:00 PM  Result Value Ref Range Status   C Diff antigen NEGATIVE NEGATIVE Final   C Diff toxin NEGATIVE NEGATIVE Final   C Diff interpretation No C. difficile detected.  Final  Gastrointestinal Panel by PCR , Stool     Status: None   Collection Time: 01/13/17  3:32 PM  Result Value Ref Range Status   Campylobacter species NOT DETECTED NOT DETECTED Final   Plesimonas shigelloides NOT DETECTED NOT DETECTED Final   Salmonella species NOT DETECTED NOT DETECTED Final   Yersinia enterocolitica NOT DETECTED NOT DETECTED Final   Vibrio species NOT DETECTED NOT DETECTED Final   Vibrio cholerae NOT DETECTED NOT DETECTED Final   Enteroaggregative E coli (EAEC) NOT DETECTED NOT DETECTED Final   Enteropathogenic E coli (EPEC) NOT DETECTED NOT DETECTED Final   Enterotoxigenic E coli (ETEC) NOT DETECTED NOT DETECTED Final   Shiga like toxin producing E coli (STEC) NOT DETECTED NOT DETECTED Final    Shigella/Enteroinvasive E coli (EIEC) NOT DETECTED NOT DETECTED Final   Cryptosporidium NOT DETECTED NOT DETECTED Final   Cyclospora cayetanensis NOT DETECTED NOT DETECTED Final   Entamoeba histolytica NOT DETECTED NOT DETECTED Final   Giardia lamblia NOT DETECTED NOT DETECTED Final   Adenovirus F40/41 NOT DETECTED NOT DETECTED Final   Astrovirus NOT DETECTED NOT DETECTED Final   Norovirus GI/GII NOT DETECTED NOT DETECTED Final   Rotavirus A NOT DETECTED NOT DETECTED Final   Sapovirus (I, II, IV, and V) NOT DETECTED NOT DETECTED Final    RADIOLOGY:  Ct Chest W Contrast  Result Date: 01/15/2017 CLINICAL DATA:  History of metastatic colon carcinoma and weakness with ongoing diarrhea EXAM: CT CHEST, ABDOMEN, AND PELVIS WITH CONTRAST TECHNIQUE: Multidetector CT imaging of the chest, abdomen and pelvis was performed following the standard protocol during bolus administration of intravenous contrast. CONTRAST:  27mL ISOVUE-300 IOPAMIDOL (ISOVUE-300) INJECTION 61% COMPARISON:  10/23/2016, 10/15/2016 FINDINGS: CT CHEST FINDINGS Cardiovascular: Thoracic aorta shows calcific changes without aneurysmal dilatation or dissection. The pulmonary artery as visualized is within normal limits although not timed for pulmonary embolism evaluation. Cardiac structures are within normal limits. Left chest wall port is noted. Mediastinum/Nodes: The thoracic inlet is within normal limits. No sizable hilar or mediastinal lymphadenopathy is noted. A large left pleural effusion is now seen new from the prior exam. Lungs/Pleura: The lungs are well aerated bilaterally with the exception of the large left pleural effusion. Some scarring is noted in the bases bilaterally as well as mild atelectasis of the left lower lobe. The previously seen pulmonary nodules have nearly completely resolved when compare with the prior study. No new pulmonary lesions are seen. Musculoskeletal: Metastatic lesions are noted which are new from the prior  exam involving the T5, T6, T7, T9 and T11 vertebral bodies. The involvement is worst at the T11 level although no significant compression is noted at this time. Healing right rib fracture is again noted on the right involving the lateral aspect of the right seventh rib. This is stable from the prior exam. CT ABDOMEN PELVIS FINDINGS Hepatobiliary: Multiple metastatic lesions are again seen throughout the liver which are more confluent when compared with the prior exam and have increased in size and number when compared with the prior study. Nearly the entire liver is involved with metastatic disease. Biliary  stents are noted arising in the right and left hepatic ducts extending inferiorly. A few areas of biliary ductal dilatation are noted most notable in the medial segment of the left lobe of the liver as well as the more superior aspect of the right lobe of the liver. The common bile duct is within normal limits distally. The gallbladder is within normal limits. Pancreas: Unremarkable. No pancreatic ductal dilatation or surrounding inflammatory changes. Spleen: Normal in size without focal abnormality. Adrenals/Urinary Tract: The adrenal glands are within normal limits. Cysts are again seen within the kidneys bilaterally. Some tiny nonobstructing stones are noted in the upper pole of the left kidney. No definitive ureteral stones are seen. The bladder is well distended. Stomach/Bowel: Rectal tube is noted in place. No obstructive changes are seen. No definitive inflammatory changes are seen. The appendix is not well visualized. Residual thickening is noted in the rectosigmoid region consistent with the patient's known given clinical history. Again no obstructive changes are noted. Vascular/Lymphatic: The aorta and its branches are within normal limits. No venous abnormality is seen. Small periaortic lymph nodes are again identified. The previously seen sizable lymphadenopathy has reduced in the interval.  Reproductive: Prostate is unremarkable. Other: Mild to moderate ascites is noted some haziness of the omental fat is seen particularly along the inferior aspect of the spleen which may represent some peritoneal implantation. Musculoskeletal: Postsurgical changes in the proximal left femur are noted. Areas of metastatic disease are seen involving the left iliac wing, right acetabulum as well as all of the lumbar vertebra. This has progressed in the interval from the prior exam as well. IMPRESSION: Findings consistent with patient's known history of metastatic colon carcinoma. There is been significant increase in both size and number of hepatic lesions with new left pleural effusion, ascites diffuse bony metastatic disease involving the thoracic and lumbar spine and left iliac bone and posterior aspect of the right acetabulum as described. Persistent wall thickening in the rectosigmoid consistent with the patient's known primary lesion. Resolution of previously seen pulmonary nodules. Electronically Signed   By: Inez Catalina M.D.   On: 01/15/2017 15:16   Ct Abdomen Pelvis W Contrast  Result Date: 01/15/2017 CLINICAL DATA:  History of metastatic colon carcinoma and weakness with ongoing diarrhea EXAM: CT CHEST, ABDOMEN, AND PELVIS WITH CONTRAST TECHNIQUE: Multidetector CT imaging of the chest, abdomen and pelvis was performed following the standard protocol during bolus administration of intravenous contrast. CONTRAST:  61mL ISOVUE-300 IOPAMIDOL (ISOVUE-300) INJECTION 61% COMPARISON:  10/23/2016, 10/15/2016 FINDINGS: CT CHEST FINDINGS Cardiovascular: Thoracic aorta shows calcific changes without aneurysmal dilatation or dissection. The pulmonary artery as visualized is within normal limits although not timed for pulmonary embolism evaluation. Cardiac structures are within normal limits. Left chest wall port is noted. Mediastinum/Nodes: The thoracic inlet is within normal limits. No sizable hilar or mediastinal  lymphadenopathy is noted. A large left pleural effusion is now seen new from the prior exam. Lungs/Pleura: The lungs are well aerated bilaterally with the exception of the large left pleural effusion. Some scarring is noted in the bases bilaterally as well as mild atelectasis of the left lower lobe. The previously seen pulmonary nodules have nearly completely resolved when compare with the prior study. No new pulmonary lesions are seen. Musculoskeletal: Metastatic lesions are noted which are new from the prior exam involving the T5, T6, T7, T9 and T11 vertebral bodies. The involvement is worst at the T11 level although no significant compression is noted at this time. Healing right rib  fracture is again noted on the right involving the lateral aspect of the right seventh rib. This is stable from the prior exam. CT ABDOMEN PELVIS FINDINGS Hepatobiliary: Multiple metastatic lesions are again seen throughout the liver which are more confluent when compared with the prior exam and have increased in size and number when compared with the prior study. Nearly the entire liver is involved with metastatic disease. Biliary stents are noted arising in the right and left hepatic ducts extending inferiorly. A few areas of biliary ductal dilatation are noted most notable in the medial segment of the left lobe of the liver as well as the more superior aspect of the right lobe of the liver. The common bile duct is within normal limits distally. The gallbladder is within normal limits. Pancreas: Unremarkable. No pancreatic ductal dilatation or surrounding inflammatory changes. Spleen: Normal in size without focal abnormality. Adrenals/Urinary Tract: The adrenal glands are within normal limits. Cysts are again seen within the kidneys bilaterally. Some tiny nonobstructing stones are noted in the upper pole of the left kidney. No definitive ureteral stones are seen. The bladder is well distended. Stomach/Bowel: Rectal tube is noted in  place. No obstructive changes are seen. No definitive inflammatory changes are seen. The appendix is not well visualized. Residual thickening is noted in the rectosigmoid region consistent with the patient's known given clinical history. Again no obstructive changes are noted. Vascular/Lymphatic: The aorta and its branches are within normal limits. No venous abnormality is seen. Small periaortic lymph nodes are again identified. The previously seen sizable lymphadenopathy has reduced in the interval. Reproductive: Prostate is unremarkable. Other: Mild to moderate ascites is noted some haziness of the omental fat is seen particularly along the inferior aspect of the spleen which may represent some peritoneal implantation. Musculoskeletal: Postsurgical changes in the proximal left femur are noted. Areas of metastatic disease are seen involving the left iliac wing, right acetabulum as well as all of the lumbar vertebra. This has progressed in the interval from the prior exam as well. IMPRESSION: Findings consistent with patient's known history of metastatic colon carcinoma. There is been significant increase in both size and number of hepatic lesions with new left pleural effusion, ascites diffuse bony metastatic disease involving the thoracic and lumbar spine and left iliac bone and posterior aspect of the right acetabulum as described. Persistent wall thickening in the rectosigmoid consistent with the patient's known primary lesion. Resolution of previously seen pulmonary nodules. Electronically Signed   By: Inez Catalina M.D.   On: 01/15/2017 15:16    Management plans discussed with the patient, family and they are in agreement. Translator on the phone Hart Carwin provided by the hospital.  CODE STATUS:     Code Status Orders        Start     Ordered   01/15/17 1620  Do not attempt resuscitation (DNR)  Continuous    Question Answer Comment  In the event of cardiac or respiratory ARREST Do not call a "code  blue"   In the event of cardiac or respiratory ARREST Do not perform Intubation, CPR, defibrillation or ACLS   In the event of cardiac or respiratory ARREST Use medication by any route, position, wound care, and other measures to relive pain and suffering. May use oxygen, suction and manual treatment of airway obstruction as needed for comfort.      01/15/17 1622    Code Status History    Date Active Date Inactive Code Status Order ID Comments User Context  01/15/2017  9:30 AM 01/15/2017  4:22 PM DNR [id number]  Melton Alar, PA-C Inpatient   01/11/2017  8:17 PM 01/15/2017  9:30 AM Full Code [id number]  Epifanio Lesches, MD ED   12/01/2016  3:17 AM 12/04/2016  7:51 PM Full Code [id number]  Harvie Bridge, DO Inpatient   12/01/2016  1:22 AM 12/01/2016  3:17 AM Full Code [id number]  Hessie Knows, MD ED   10/24/2016  1:39 PM 10/27/2016  6:07 PM Full Code [id number]  Dustin Flock, MD Inpatient      TOTAL TIME TAKING CARE OF THIS PATIENT: 35 minutes.    Loletha Grayer M.D on 01/16/2017 at 9:42 AM  Between 7am to 6pm - Pager - [phone]  After 6pm go to [email or web address] - password [email or web address]  [facility]  [phone]  CC: Primary care physician; Baltazar Apo, MD

## 2017-01-16 NOTE — Care Management Note (Signed)
Case Management Note  Patient Details  Name: Linas Raterman MRN: [id number] Date of Birth: [birth year]  Subjective/Objective:   Patient to discharge to [place] today. [facility] notified                 Action/Plan:   Expected Discharge Date:     01/16/2017             Expected Discharge Plan:  [place]  In-House Referral:     Discharge planning Services     Post Acute Care Choice:  Hospice Choice offered to:     DME Arranged:    DME Agency:     HH Arranged:    [place] Agency:     Status of Service:  Completed, signed off  If discussed at [employer], dates discussed:    Additional Comments:  Jolly Mango, RN 01/16/2017, 9:28 AM

## 2017-01-16 NOTE — Discharge Instructions (Signed)
Fractura de hmero tratada con inmovilizacin (Humerus Fracture Treated With Immobilization) El hmero es el hueso grande que se encuentra en la parte superior del brazo. La quebradura de hmero (fracturado) se trata colocando un yeso, una frula o cabestrillo (inmovilizacin). Esto sostiene las partes quebradas en el lugar para que se puedan curar. CUIDADOS EN EL HOGAR Si tiene un yeso:   No introduzca nada adentro del yeso para rascarse la piel.  [place] piel de alrededor del yeso. Informe al mdico si tiene alguna inquietud. Puede aplicar una locin en la piel seca alrededor de los bordes del yeso. No aplique locin en la piel por debajo del yeso.  Mantenga el yeso limpio y Careers information officer se lo haya indicado el mdico. Si tiene una frula:   selo como se lo haya indicado el mdico. Quteselo solamente como se lo haya indicado el mdico.  Afloje la frula si los dedos se le entumecen, siente hormigueos o se le enfran y se tornan de Optician, dispensing.  Mantenga la frula limpia y seca. Si tiene un cabestrillo:   selo como se lo haya indicado el mdico. Quteselo solamente como se lo haya indicado el mdico. El bao   No tome baos de inmersin, no practique natacin ni use el jacuzzi hasta que el mdico lo autorice. Pregntele al mdico si puede ducharse. Thurston Pounds solo le permitan darse baos de [place].  Si el yeso o la frula no son impermeables, cbralos con una bolsa de plstico hermtica mientras toma un bao de inmersin o una ducha. No permita que el yeso o la frula se mojen.  Si tiene un cabestrillo, quteselo para baarse solo si el mdico se lo permite. Control del dolor, la rigidez y la hinchazn   Si se lo indican, aplique hielo sobre la zona de la lesin.  Ponga el hielo en una bolsa plstica.  Coloque una toalla entre la piel y la bolsa de hielo.  Coloque el hielo durante 58minutos, 2 a 3veces por Training and development officer.  Mueva los dedos con frecuencia para evitar la  rigidez y reducir la hinchazn.  Cuando est sentado o acostado, eleve la zona de la lesin por encima del nivel del corazn. Conducir   No conduzca ni use maquinaria pesada mientras toma analgsicos recetados.  No conduzca mientras use un yeso, una frula o un cabestrillo en el brazo que [place] para conducir. Actividad   Retome sus actividades habituales como se lo haya indicado el mdico. Pregntele al mdico qu actividades son seguras para usted.  Haga ejercicios de flexibilidad solamente como se lo haya indicado el mdico. Instrucciones generales   No ejerza presin en ninguna parte del yeso o de la frula hasta que se hayan endurecido. Esto tardar muchas horas.  No use productos que contengan tabaco. Estos incluyen cigarrillos, tabaco para mascar o cigarrillos electrnicos. El tabaco puede retardar la consolidacin de la fractura. Si necesita ayuda para dejar de fumar, consulte al [employer].  [employer] de venta libre y los recetados solamente como se lo haya indicado el mdico.  Consulting civil engineer a todas las visitas de control como se lo haya indicado el mdico. Esto es importante. SOLICITE AYUDA SI:  Tiene dolor, hinchazn o hematomas nuevos.  El dolor, la hinchazn o los hematomas no mejoran.  El yeso, la frula o el cabestrillo se daan o se aflojan. SOLICITE AYUDA DE INMEDIATO SI:  La piel o los dedos del brazo lesionado se tornan azules o grises.  Siente el brazo adormecido  o fro.  Siente un dolor muy intenso en el brazo lesionado. Esta informacin no tiene Marine scientist el consejo del mdico. Asegrese de hacerle al mdico cualquier pregunta que tenga. Document Released: 02/22/2009 Document Revised: 03/19/2016 Document Reviewed: 04/20/2015 Elsevier Interactive Patient Education  2017 [employer].

## 2017-01-17 DIAGNOSIS — C7800 Secondary malignant neoplasm of unspecified lung: Secondary | ICD-10-CM | POA: Diagnosis not present

## 2017-01-17 DIAGNOSIS — C7951 Secondary malignant neoplasm of bone: Secondary | ICD-10-CM | POA: Diagnosis not present

## 2017-01-17 DIAGNOSIS — C787 Secondary malignant neoplasm of liver and intrahepatic bile duct: Secondary | ICD-10-CM | POA: Diagnosis not present

## 2017-01-17 DIAGNOSIS — C19 Malignant neoplasm of rectosigmoid junction: Secondary | ICD-10-CM | POA: Diagnosis not present

## 2017-01-18 ENCOUNTER — Other Ambulatory Visit: Payer: Self-pay | Admitting: *Deleted

## 2017-01-18 DIAGNOSIS — C787 Secondary malignant neoplasm of liver and intrahepatic bile duct: Secondary | ICD-10-CM | POA: Diagnosis not present

## 2017-01-18 DIAGNOSIS — C19 Malignant neoplasm of rectosigmoid junction: Secondary | ICD-10-CM | POA: Diagnosis not present

## 2017-01-18 DIAGNOSIS — C7951 Secondary malignant neoplasm of bone: Secondary | ICD-10-CM | POA: Diagnosis not present

## 2017-01-18 DIAGNOSIS — C7800 Secondary malignant neoplasm of unspecified lung: Secondary | ICD-10-CM | POA: Diagnosis not present

## 2017-01-18 NOTE — Patient Outreach (Signed)
[name]) Care Management  01/18/2017  Nathan Atkinson 12-23-1950 [id number]   Subjective: Telephone call to [facility] ([place] interpreter, [employer], [place]).   Nathan Atkinson called patient's home, spoke with wife, states patient unable to talk, currently sleeping, advised patient's wife, writer would call back on 01/21/17.   Telephone call to Nathan Atkinson at [facility] Management, no answer, left HIPAA compliant voicemail message, and requested call back  Objective: Per chart review, patient hospitalized 01/11/17 - 01/16/17 for Closed fracture of shaft of humerus.   Patient also has a history of: metastatic colon cancer, diabetes, hypertension, and hyperlipidemia.  Last contact with [facility] Care Management Link to Wellness RNCM was on 09/05/16.     Assessment: Received [employer] report referral on 01/14/17.   Transition of care follow up pending patient contact.    Plan: RNCM will call patient for 2nd telephone outreach attempt via [place] interpreter, transition of care follow up, within 10 business days. RNCM will follow up with [place] to [place] within 10 business days to verify patient's program status.      Nathan Atkinson. Annia Friendly, BSN, [place] Management [facility] Telephonic CM Phone: [phone] Fax: [phone]

## 2017-01-19 DIAGNOSIS — C19 Malignant neoplasm of rectosigmoid junction: Secondary | ICD-10-CM | POA: Diagnosis not present

## 2017-01-19 DIAGNOSIS — C7800 Secondary malignant neoplasm of unspecified lung: Secondary | ICD-10-CM | POA: Diagnosis not present

## 2017-01-19 DIAGNOSIS — C7951 Secondary malignant neoplasm of bone: Secondary | ICD-10-CM | POA: Diagnosis not present

## 2017-01-19 DIAGNOSIS — C787 Secondary malignant neoplasm of liver and intrahepatic bile duct: Secondary | ICD-10-CM | POA: Diagnosis not present

## 2017-01-20 DIAGNOSIS — C787 Secondary malignant neoplasm of liver and intrahepatic bile duct: Secondary | ICD-10-CM | POA: Diagnosis not present

## 2017-01-20 DIAGNOSIS — C19 Malignant neoplasm of rectosigmoid junction: Secondary | ICD-10-CM | POA: Diagnosis not present

## 2017-01-20 DIAGNOSIS — C7951 Secondary malignant neoplasm of bone: Secondary | ICD-10-CM | POA: Diagnosis not present

## 2017-01-20 DIAGNOSIS — C7800 Secondary malignant neoplasm of unspecified lung: Secondary | ICD-10-CM | POA: Diagnosis not present

## 2017-01-21 ENCOUNTER — Ambulatory Visit: Payer: Self-pay | Admitting: *Deleted

## 2017-01-21 DIAGNOSIS — C7951 Secondary malignant neoplasm of bone: Secondary | ICD-10-CM | POA: Diagnosis not present

## 2017-01-21 DIAGNOSIS — C787 Secondary malignant neoplasm of liver and intrahepatic bile duct: Secondary | ICD-10-CM | POA: Diagnosis not present

## 2017-01-21 DIAGNOSIS — C19 Malignant neoplasm of rectosigmoid junction: Secondary | ICD-10-CM | POA: Diagnosis not present

## 2017-01-21 DIAGNOSIS — C7800 Secondary malignant neoplasm of unspecified lung: Secondary | ICD-10-CM | POA: Diagnosis not present

## 2017-01-22 ENCOUNTER — Other Ambulatory Visit: Payer: Self-pay | Admitting: *Deleted

## 2017-01-22 DIAGNOSIS — C7800 Secondary malignant neoplasm of unspecified lung: Secondary | ICD-10-CM | POA: Diagnosis not present

## 2017-01-22 DIAGNOSIS — C19 Malignant neoplasm of rectosigmoid junction: Secondary | ICD-10-CM | POA: Diagnosis not present

## 2017-01-22 DIAGNOSIS — C787 Secondary malignant neoplasm of liver and intrahepatic bile duct: Secondary | ICD-10-CM | POA: Diagnosis not present

## 2017-01-22 DIAGNOSIS — C7951 Secondary malignant neoplasm of bone: Secondary | ICD-10-CM | POA: Diagnosis not present

## 2017-01-22 NOTE — Patient Outreach (Signed)
[facility]) Care Management  01/22/2017  Nathan Atkinson Apr 15, 1951 [id number]   Subjective: Case discussed with Nathan Atkinson at [place] Management, Link to Wellness case will be closed and this RNCM will follow up for transition of care and verify hospice status.  Telephone call to [facility], times 2, no answer, call placed on hold by receiver, and then dropped.  Telephone call to Nathan Atkinson ([place] interpreter, [employer], [place]).  Nathan Atkinson called patient's home, states no answer, no answering machine available, and unable to leave a message.   Objective: Per chart review, patient hospitalized 01/11/17 - 01/16/17 for Closed fracture of shaft of humerus.   Patient also has a history of: metastatic colon cancer, diabetes, hypertension, and hyperlipidemia.  Last contact with [facility] Care Management Link to Wellness RNCM was on 09/05/16.   Patient discharged to home with hospice on 01/16/17.     Assessment: Received [employer] report referral on 01/14/17.   Transition of care follow up pending patient contact.    Plan: RNCM will call patient for 3rd telephone outreach attempt via [place] interpreter, transition of care follow up, within 10 business days.     Nathan Atkinson H. Annia Friendly, BSN, [place] Management [facility] Telephonic CM Phone: [phone] Fax: [phone]

## 2017-01-22 NOTE — Patient Outreach (Signed)
[place] [facility]) Care Management  01/22/2017  Nathan Atkinson 1951/07/07 [id number]   Patient has recently been discharged from the hospital (phone call from [facility] case manager Sonda Rumble).  She will perform transition of care call to the patient   I will discharge [name] from my care at this time. A case closure letter has been sent to the doctor but based on the patient's current health status, a case closure letter was not sent to the patient's home.  (From MD note dated 01/11/17- "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 Failure to thrive. Patient will be transferred to the hospice home for end-of-life care".)   [name], RN, BA, [place], [place] To Wellness Direct Dial:  [phone]  Fax:  [phone] E-mail: [email or web address] [address] [place], [place]  [address]

## 2017-01-23 ENCOUNTER — Other Ambulatory Visit: Payer: Self-pay | Admitting: *Deleted

## 2017-01-23 ENCOUNTER — Encounter: Payer: Self-pay | Admitting: *Deleted

## 2017-01-23 DIAGNOSIS — C7800 Secondary malignant neoplasm of unspecified lung: Secondary | ICD-10-CM | POA: Diagnosis not present

## 2017-01-23 DIAGNOSIS — C7951 Secondary malignant neoplasm of bone: Secondary | ICD-10-CM | POA: Diagnosis not present

## 2017-01-23 DIAGNOSIS — C787 Secondary malignant neoplasm of liver and intrahepatic bile duct: Secondary | ICD-10-CM | POA: Diagnosis not present

## 2017-01-23 DIAGNOSIS — C19 Malignant neoplasm of rectosigmoid junction: Secondary | ICD-10-CM | POA: Diagnosis not present

## 2017-01-30 ENCOUNTER — Other Ambulatory Visit: Payer: Self-pay | Admitting: *Deleted

## 2017-01-30 NOTE — Patient Outreach (Signed)
[place] [facility]) Care Management  01/30/2017  Nathan Atkinson 01/24/1951 [id number]   Case discussed with [name], patient expired on 02/04/17, and will proceed with case closure.   Case closure due to expired request sent to [facility].    [name] H. Annia Friendly, BSN, [place] Management [facility] Telephonic CM Phone: [phone] Fax: [phone]

## 2017-02-07 NOTE — Patient Outreach (Signed)
[facility]) Care Management  2017/02/12  [name] 09-29-1951 [id number]   Subjective: Telephone call to [facility], placed on extended hold and call disconnected. Telephone call to Nathan Atkinson [employer] interpreter, [employer], [place]). Nathan Atkinson called patient's home, states no answer, no answering machine available, and unable to leave a message.   Objective: Per chart review, patient hospitalized 01/11/17 - 01/16/17 for Closed fracture of shaft of humerus. Patient also has a history of: metastatic colon cancer, diabetes, hypertension, and hyperlipidemia.  Last contact with [facility] Care Management Link to Wellness RNCM was on 09/05/16.  Patient discharged to home with hospice on 01/16/17.     Assessment:Received [employer] Census report referral on 01/14/17. Transition of care follow up pending patient contact.    Plan:RNCM will proceed with case closure if no return call within 10 business days.   No spanish unsuccessful outreach letter available at this time.      Choua Ikner H. Annia Friendly, BSN, [place] Management [facility] Telephonic CM Phone: [phone] Fax: [phone]

## 2017-02-07 NOTE — Progress Notes (Signed)
This encounter was created in error - please disregard.

## 2017-02-07 DEATH — deceased

## 2017-03-22 ENCOUNTER — Other Ambulatory Visit: Payer: Self-pay | Admitting: Nurse Practitioner

## 2018-01-21 IMAGING — US US ABDOMEN LIMITED
1 series · 14 of 25 positions shown · non-contrast
Comparison: CT of the abdomen on 10/23/2016

CLINICAL DATA: Metastatic colon carcinoma to the liver and status
post placement of endoscopic biliary stent in the common bile duct
to treat biliary obstruction. Evaluation of the liver and bile ducts
performed due to rising bilirubin.

EXAM:
US ABDOMEN LIMITED - RIGHT UPPER QUADRANT

[Series 1: us abdomen limited · 0.20mm/px · 14 of 70 slices shown]
[im 1/70]
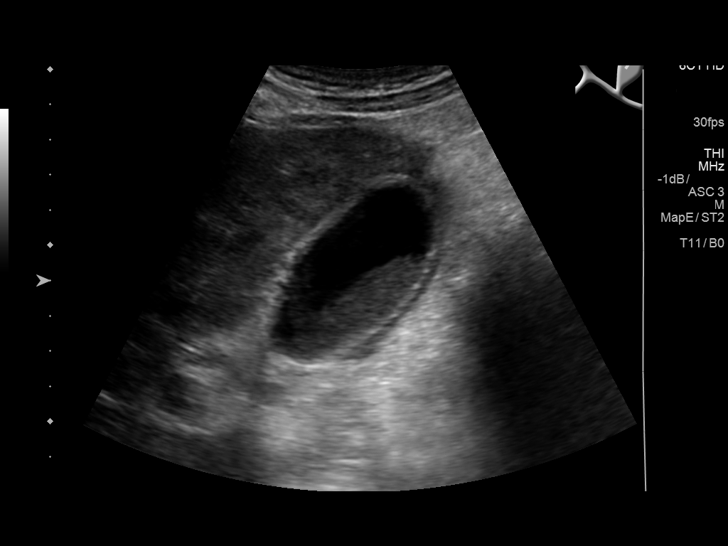
[im 6/70]
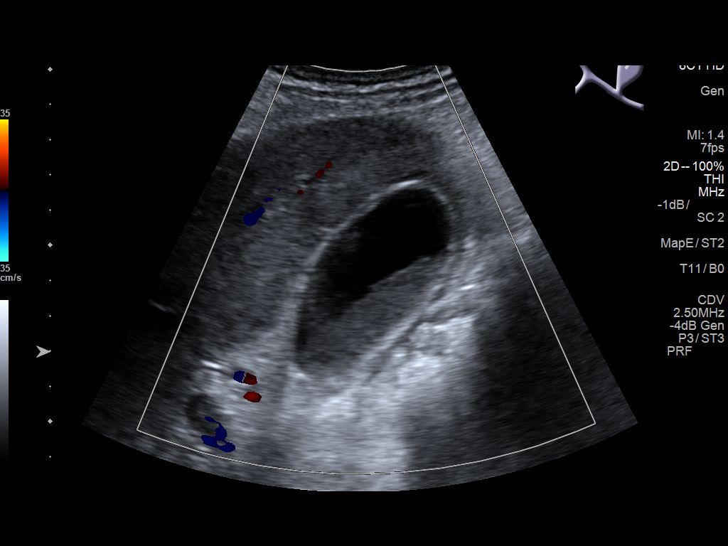
[im 12/70]
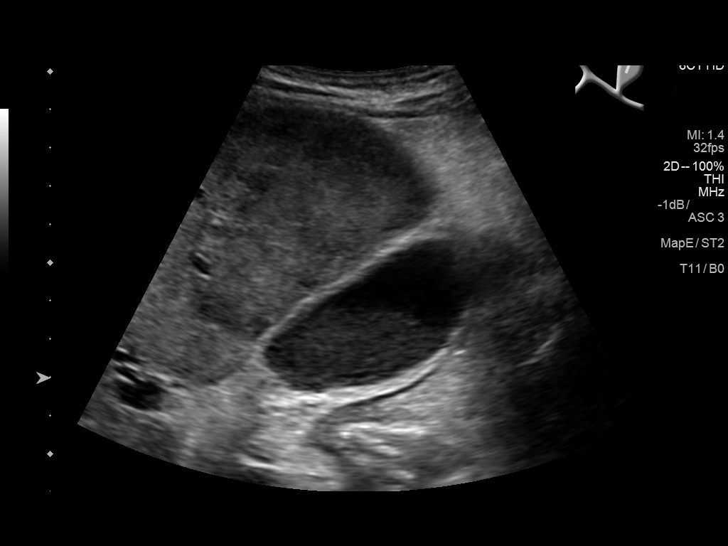
[im 18/70]
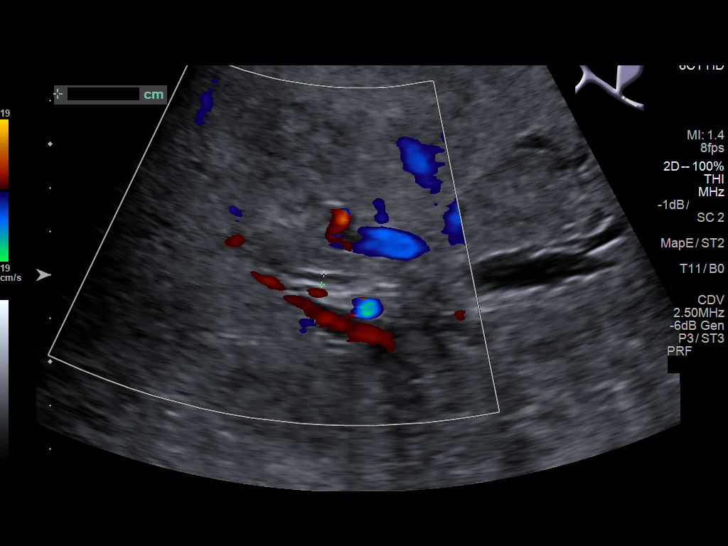
[im 24/70]
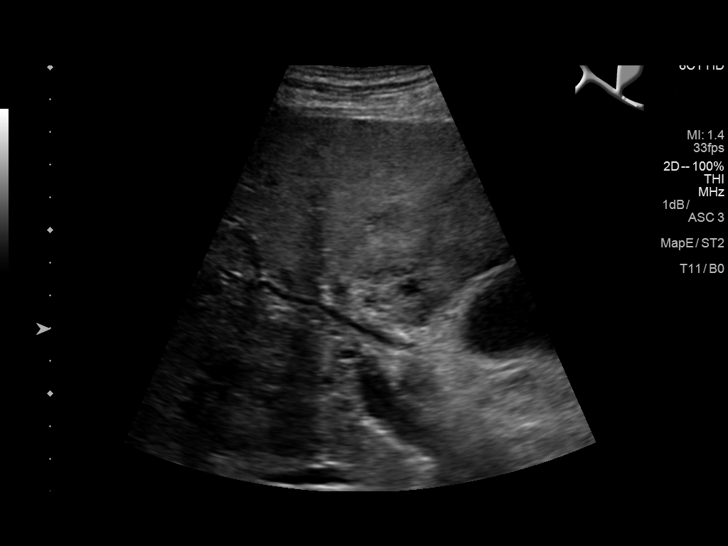
[im 26/70]
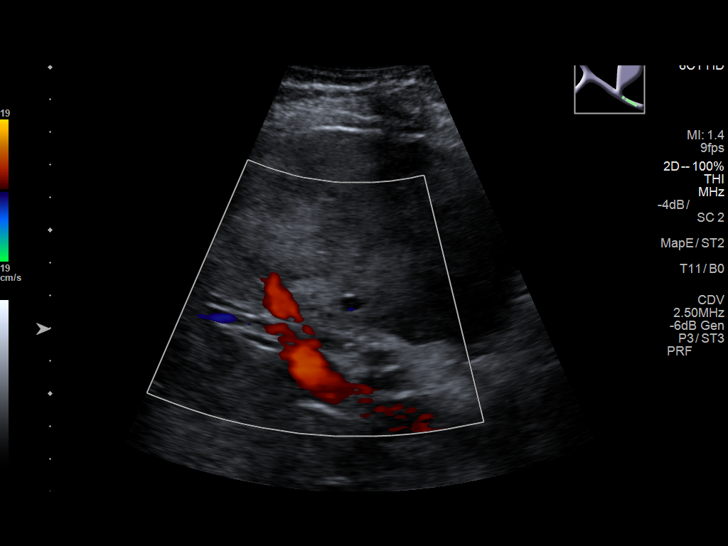
[im 32/70]
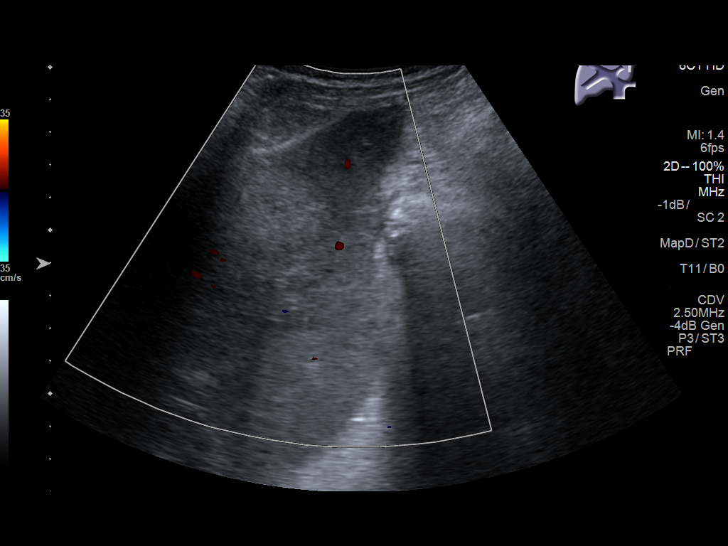
[im 38/70]
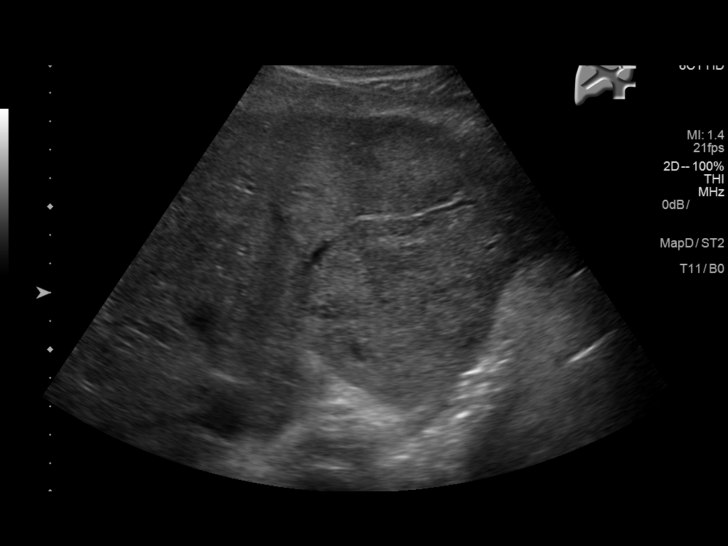
[im 44/70]
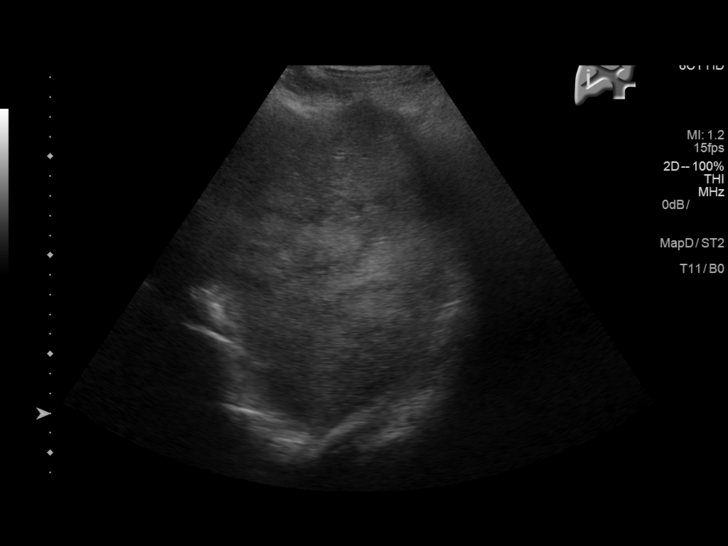
[im 47/70]
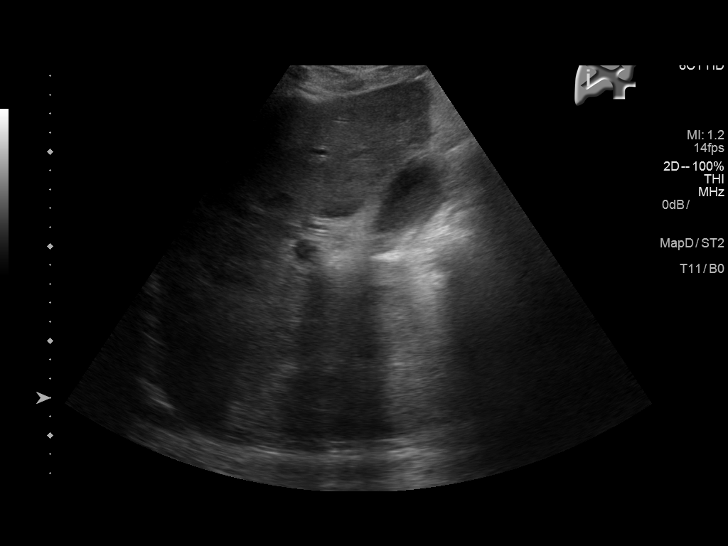
[im 52/70]
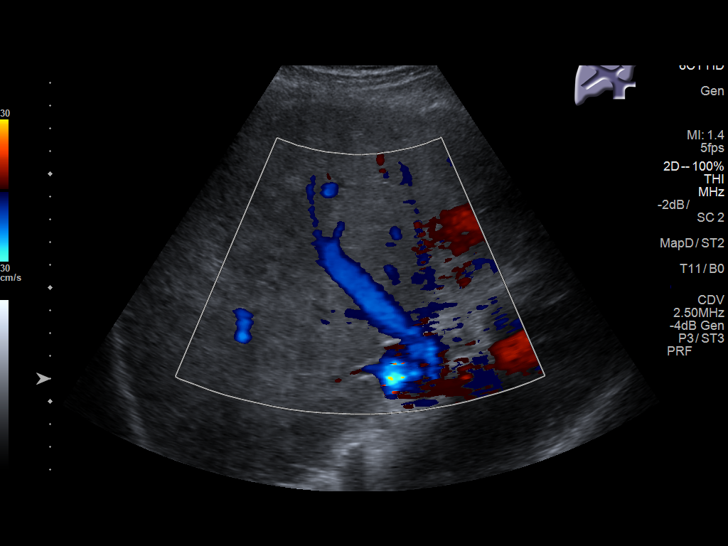
[im 58/70]
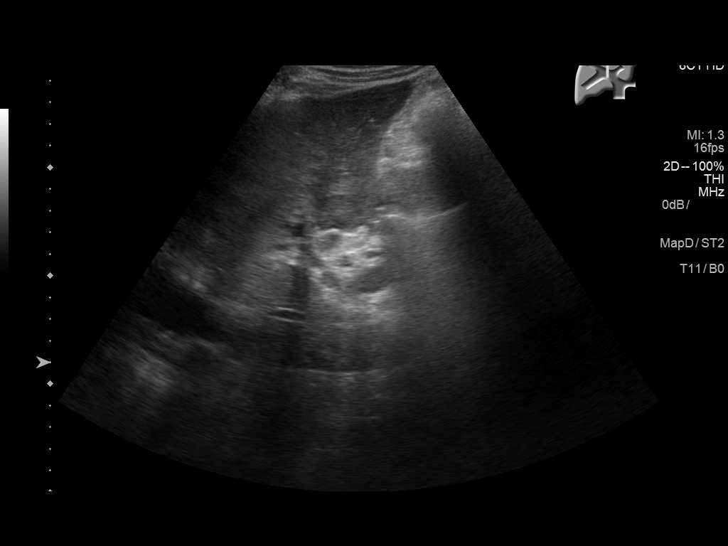
[im 64/70]
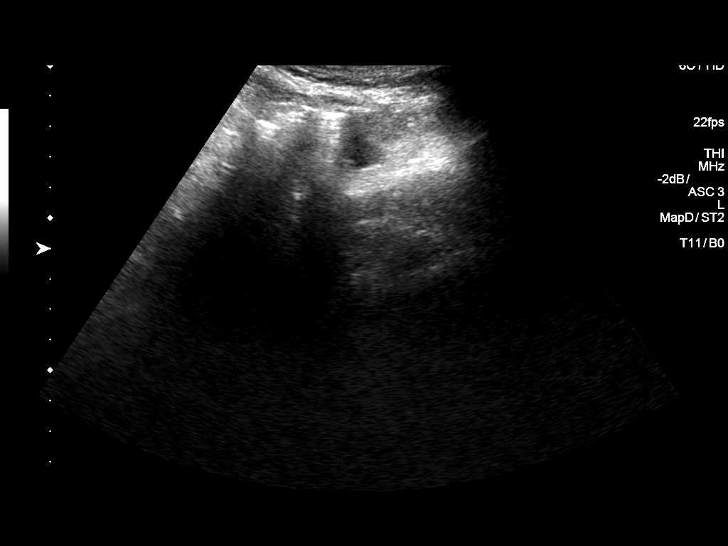
[im 70/70]
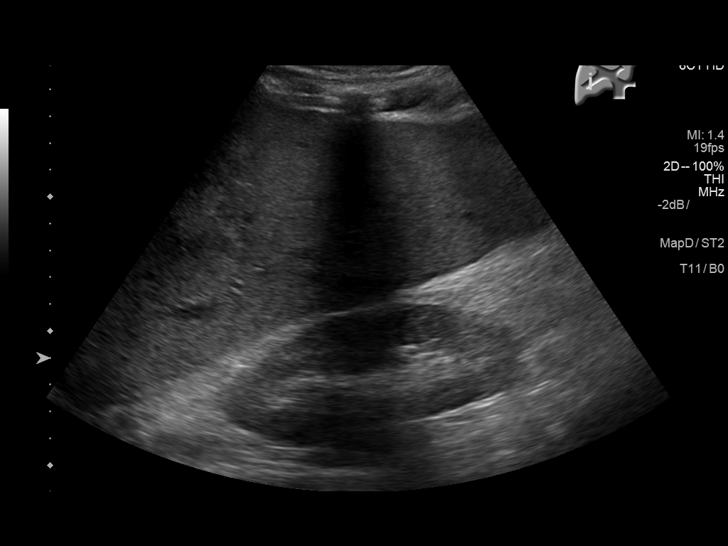

[14 of 25 positions shown; findings below may reference images not displayed]

FINDINGS: Gallbladder:

The gallbladder contains sludge.

Common bile duct:

Diameter: 4-7 mm without significant dilatation. Common bile duct
stent is visible by ultrasound.

Liver:

Multiple masses are seen throughout the liver and the liver shows
diffuse enlargement. There is no evidence of significant
intrahepatic biliary ductal dilatation.
IMPRESSION: No evidence by ultrasound of progressive biliary obstruction status
post endoscopic biliary stent placement. Percutaneous biliary
drainage currently would not be possible given the decompressed
appearance of bile ducts within the liver parenchyma.

## 2018-09-06 IMAGING — CT CT ABDOMEN W/ CM
2 of 5 series · 15 of 46 positions shown, 17 images · IV contrast (iopamidol)
Comparison: CT chest 10/15/2016, CT abdomen [DATE]

CLINICAL DATA: Colorectal carcinoma with liver metastasis.
Jaundice. Concern for obstruction.

EXAM:
CT ABDOMEN WITH CONTRAST
TECHNIQUE: Multidetector CT imaging of the abdomen was performed using the
standard protocol following bolus administration of intravenous
contrast.
CONTRAST:  100mL LIRICD-L77 IOPAMIDOL (LIRICD-L77) INJECTION 61%

[Series 2: axial st · axial · 0.66mm/px · z∈[+849,+1119]mm · 12 of 64 slices shown, 14 images]
[im 5/64  soft-tissue]
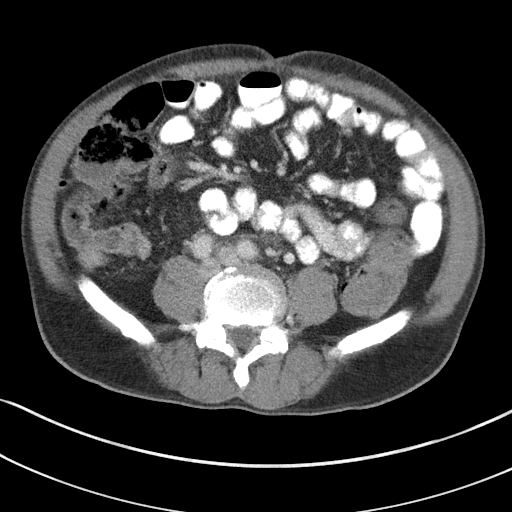
[im 5/64  bone]
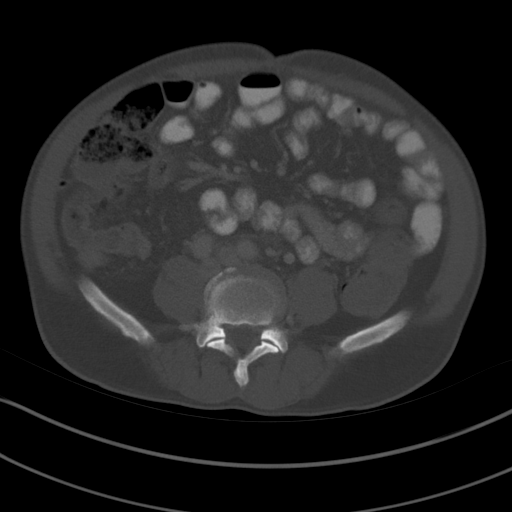
[im 10/64  soft-tissue]
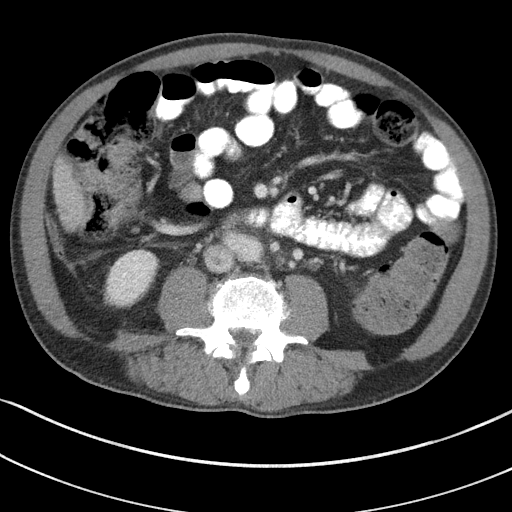
[im 15/64  soft-tissue]
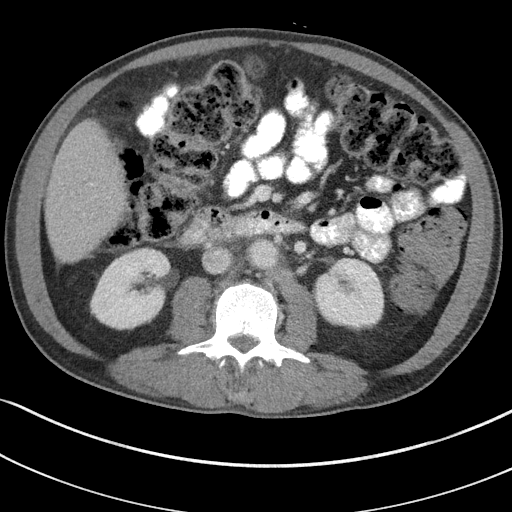
[im 20/64  soft-tissue]
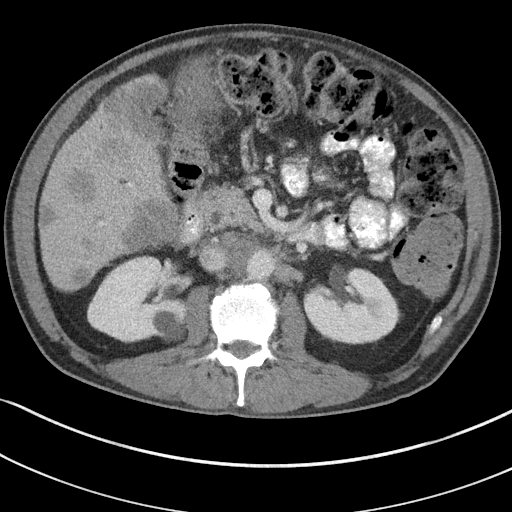
[im 25/64  soft-tissue]
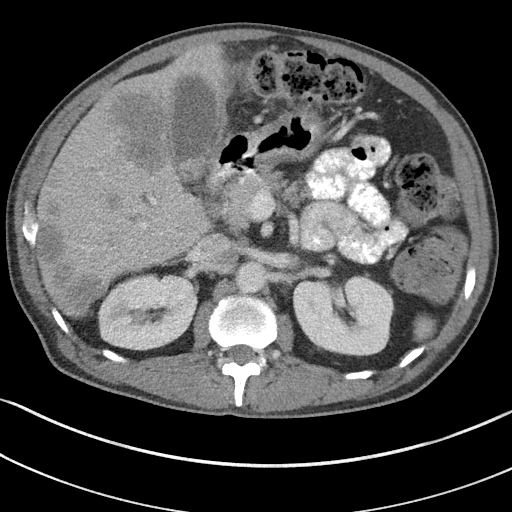
[im 30/64  soft-tissue]
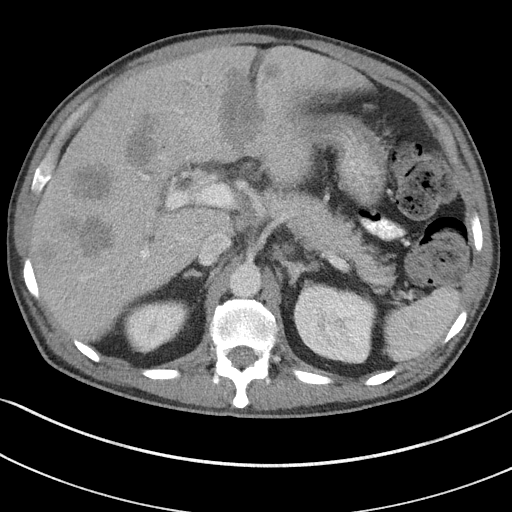
[im 34/64  soft-tissue]
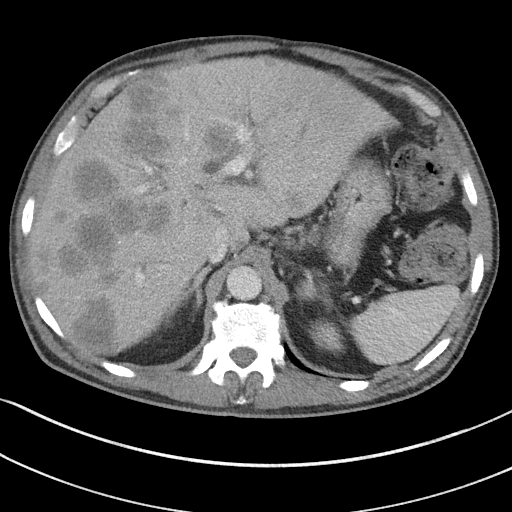
[im 39/64  soft-tissue]
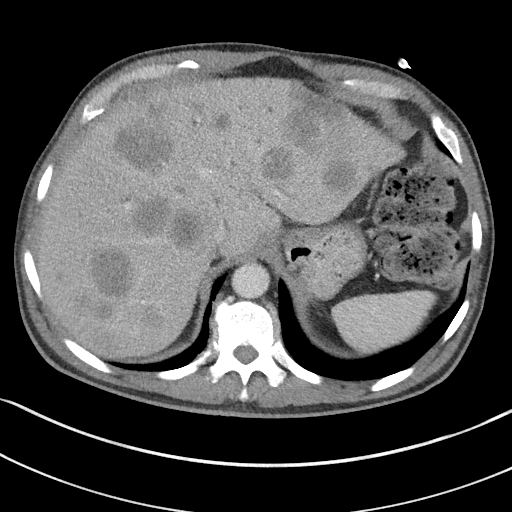
[im 44/64  soft-tissue]
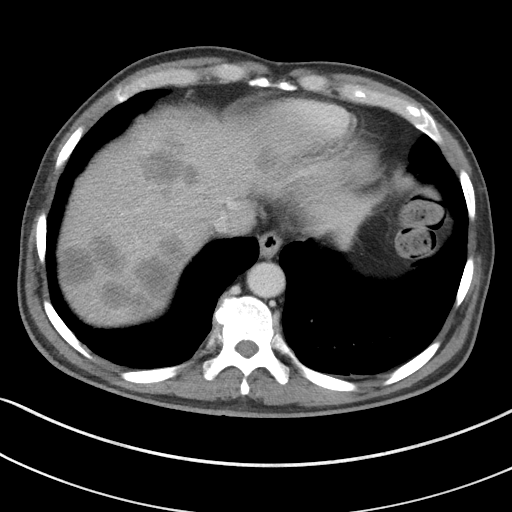
[im 44/64  bone]
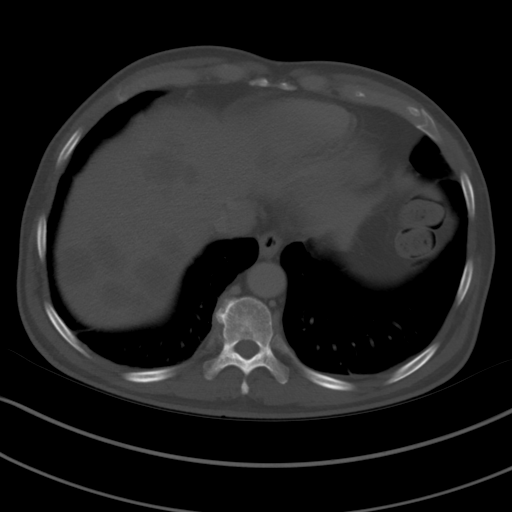
[im 49/64  soft-tissue]
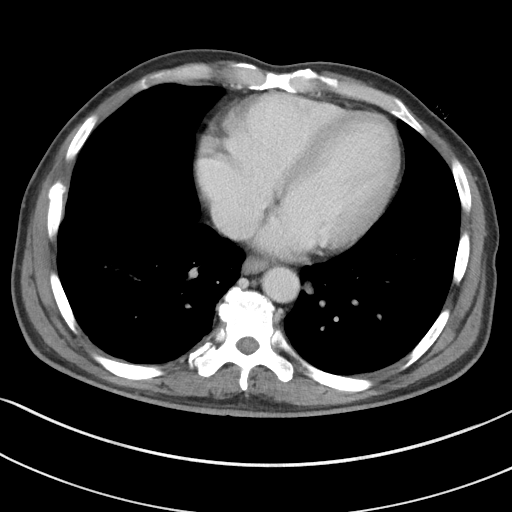
[im 54/64  soft-tissue]
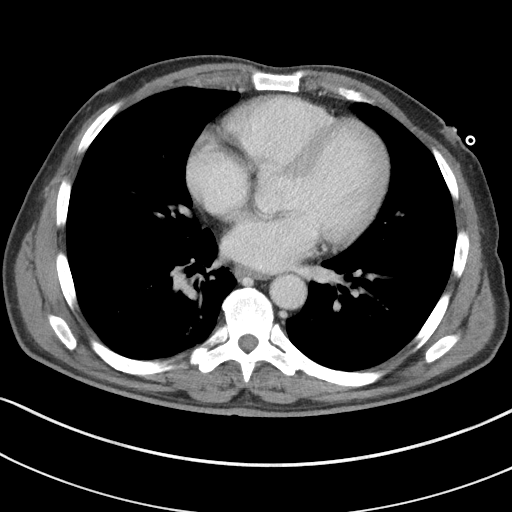
[im 59/64  soft-tissue]
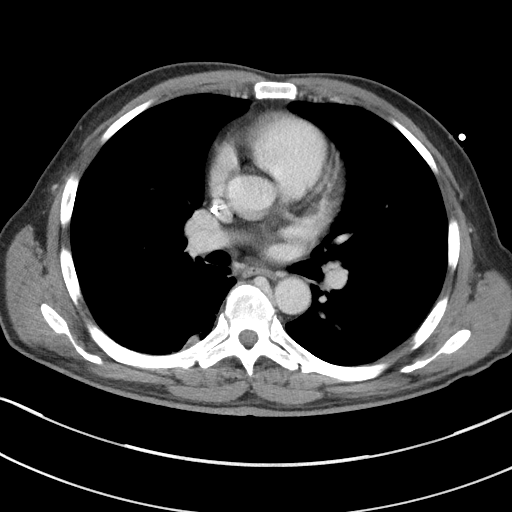

[Series 5: coronal st · coronal · 0.62mm/px · 3 of 86 slices shown]
[im 29/86  soft-tissue]
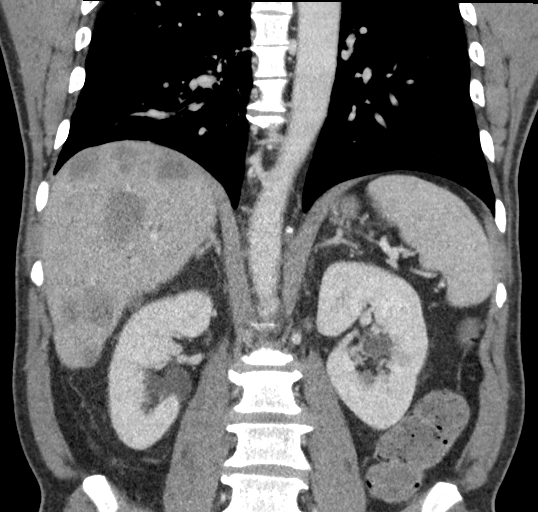
[im 38/86  soft-tissue]
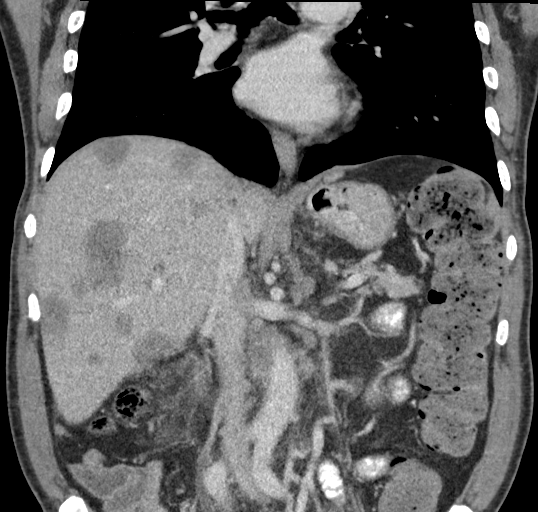
[im 48/86  soft-tissue]
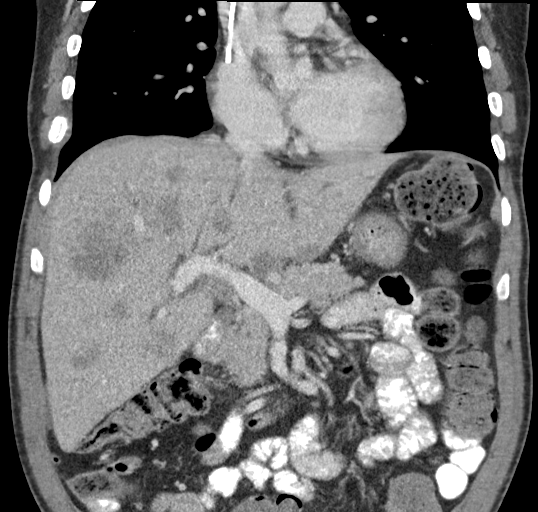

[15 of 46 positions shown; findings below may reference images not displayed]

FINDINGS: Lower chest: RIGHT lower lobe and LEFT lower lobe pulmonary nodules
again demonstrated. No short interval change .

Hepatobiliary: Multiple round irregular hepatic metastasis again
demonstrated. Lesions increased in size in short interval. For
example posterior LEFT hepatic lobe lesion measuring 20 mm (image
32, series 2) compares to 13 mm. The lesion in the RIGHT hepatic
lobe adjacent gallbladder fossa measures 4.5 cm compared to 3.8 cm.
Lesion the posterior RIGHT hepatic lobe measures 3.6 cm compared to
2.5 cm.

There is mild intrahepatic duct dilatation which is increased
compared to prior. Duct dilatation is within the LEFT and RIGHT
hepatic lobe but more prominent on the LEFT. There is no dilatation
of the common hepatic duct or common bile duct.

The gallbladder is thick-walled with peri cholecystic fluid
inflammation. This inflammation seen on sagittal image 24, series 5.

Pancreas: No pancreatic lesion.  No pancreatic duct dilatation

Spleen: Normal spleen

Adrenals/Urinary Tract: Adrenal glands kidneys are unchanged.
Low-density lesions within the RIGHT kidney are stable.

Stomach/Bowel: Stomach limited view of the bowel is unremarkable.

Vascular/Lymphatic: Abdominal aorta is normal caliber. There is
retroperitoneal lymphadenopathy. For example lymph node positioned
between the IVC and aorta measures 16 mm short axis increased from
12 mm. There is loss of fat planes around this lymph node. Lymph
node the porta hepatis is low-density consistent necrosis measuring
17 mm similar to 17 mm.

Other: No peritoneal disease.  No aggressive osseous lesion.

Musculoskeletal: No aggressive osseous
IMPRESSION: 1. Increase in intra hepatic biliary duct dilatation. Increased
inflammation around the gallbladder. Findings are concerning for
biliary obstruction at the level of porta hepatis. Most likely
etiology would be metastatic adenopathy in the porta hepatis
constricting the extrahepatic bile duct. Consider ERCP with bile
duct stenting.
2. Common bile duct is normal caliber through the pancreatic head.
No pancreatic inflammation.
3. Interval increase in size of hepatic metastasis.
4. Interval increase in retroperitoneal adenopathy.
5. Stable pulmonary metastasis.
Findings conveyed Aneudy Silver on 10/23/2016  at[DATE].
